# Patient Record
Sex: Male | Born: 1985
Health system: Southern US, Community
[De-identification: ages and names within clinical notes are randomized; demographics above are authoritative.]

## PROBLEM LIST (undated history)

## (undated) DIAGNOSIS — K219 Gastro-esophageal reflux disease without esophagitis: Secondary | ICD-10-CM

## (undated) DIAGNOSIS — J302 Other seasonal allergic rhinitis: Secondary | ICD-10-CM

## (undated) DIAGNOSIS — F988 Other specified behavioral and emotional disorders with onset usually occurring in childhood and adolescence: Secondary | ICD-10-CM

## (undated) DIAGNOSIS — Z800 Family history of malignant neoplasm of digestive organs: Secondary | ICD-10-CM

## (undated) DIAGNOSIS — Z87442 Personal history of urinary calculi: Secondary | ICD-10-CM

## (undated) DIAGNOSIS — Z85830 Personal history of malignant neoplasm of bone: Secondary | ICD-10-CM

## (undated) DIAGNOSIS — F419 Anxiety disorder, unspecified: Secondary | ICD-10-CM

## (undated) DIAGNOSIS — Z8042 Family history of malignant neoplasm of prostate: Secondary | ICD-10-CM

## (undated) DIAGNOSIS — I341 Nonrheumatic mitral (valve) prolapse: Secondary | ICD-10-CM

## (undated) DIAGNOSIS — I7300 Raynaud's syndrome without gangrene: Secondary | ICD-10-CM

## (undated) DIAGNOSIS — Z803 Family history of malignant neoplasm of breast: Secondary | ICD-10-CM

## (undated) DIAGNOSIS — Z9889 Other specified postprocedural states: Secondary | ICD-10-CM

## (undated) DIAGNOSIS — R011 Cardiac murmur, unspecified: Secondary | ICD-10-CM

## (undated) DIAGNOSIS — E785 Hyperlipidemia, unspecified: Secondary | ICD-10-CM

## (undated) HISTORY — DX: Family history of malignant neoplasm of prostate: Z80.42

## (undated) HISTORY — DX: Family history of malignant neoplasm of digestive organs: Z80.0

## (undated) HISTORY — PX: INNER EAR SURGERY: SHX679

## (undated) HISTORY — DX: Family history of malignant neoplasm of breast: Z80.3

## (undated) HISTORY — DX: Other seasonal allergic rhinitis: J30.2

---

## 2011-07-22 ENCOUNTER — Inpatient Hospital Stay (INDEPENDENT_AMBULATORY_CARE_PROVIDER_SITE_OTHER)
Admission: RE | Admit: 2011-07-22 | Discharge: 2011-07-22 | Disposition: A | Discharge status: Home / Self Care | Payer: 59 | Source: Ambulatory Visit | Attending: Family Medicine | Admitting: Family Medicine

## 2011-07-22 ENCOUNTER — Encounter: Payer: Self-pay | Admitting: Family Medicine

## 2011-07-22 DIAGNOSIS — J209 Acute bronchitis, unspecified: Secondary | ICD-10-CM

## 2011-07-22 DIAGNOSIS — R05 Cough: Secondary | ICD-10-CM | POA: Insufficient documentation

## 2011-07-22 DIAGNOSIS — R059 Cough, unspecified: Secondary | ICD-10-CM | POA: Insufficient documentation

## 2011-08-29 NOTE — Progress Notes (Signed)
Summary: cough rm 5   Vital Signs:  Patient Profile:   25 Years Old Male CC:      Cough x 2wks Height:     69 inches Weight:      208.50 pounds O2 Sat:      98 % O2 treatment:    Room Air Temp:     98.8 degrees F oral Pulse rate:   71 / minute Resp:     18 per minute BP sitting:   117 / 76  (left arm) Cuff size:   large  Vitals Entered By: Clemens Catholic LPN (July 22, 2011 10:08 AM)                  Updated Prior Medication List: No Medications Current Allergies: No known allergies History of Present Illness Chief Complaint: Cough x 2wks History of Present Illness:  Subjective: Patient complains of onset of non-productive cough about two weeks ago + scratchy throat started yesterday. No pleuritic pain No wheezing Minimal nasal congestion for about two days + post-nasal drainage No sinus pain/pressure No itchy/red eyes No earache No hemoptysis No SOB No fever/chills No nausea No vomiting No abdominal pain No diarrhea No skin rashes No fatigue No myalgias No headache Used OTC meds without relief (Robitussin)  REVIEW OF SYSTEMS Constitutional Symptoms      Denies fever, chills, night sweats, weight loss, weight gain, and fatigue.  Eyes       Complains of glasses and contact lenses.      Denies change in vision, eye pain, eye discharge, and eye surgery. Ear/Nose/Throat/Mouth       Denies hearing loss/aids, change in hearing, ear pain, ear discharge, dizziness, frequent runny nose, frequent nose bleeds, sinus problems, sore throat, hoarseness, and tooth pain or bleeding.  Respiratory       Complains of dry cough and productive cough.      Denies wheezing, shortness of breath, asthma, bronchitis, and emphysema/COPD.  Cardiovascular       Denies murmurs, chest pain, and tires easily with exhertion.    Gastrointestinal       Denies stomach pain, nausea/vomiting, diarrhea, constipation, blood in bowel movements, and indigestion. Genitourniary  Denies painful urination, kidney stones, and loss of urinary control. Neurological       Denies paralysis, seizures, and fainting/blackouts. Musculoskeletal       Denies muscle pain, joint pain, joint stiffness, decreased range of motion, redness, swelling, muscle weakness, and gout.  Skin       Denies bruising, unusual mles/lumps or sores, and hair/skin or nail changes.  Psych       Denies mood changes, temper/anger issues, anxiety/stress, speech problems, depression, and sleep problems. Other Comments: pt c/o cough x 2wks, mostly dry cough, productive at times. no fever. he has taken Robt with no relief.    Past History:  Past Medical History: Unremarkable  Past Surgical History: stapes replacement 2008  Family History: none  Social History: Never Smoked Alcohol use-no Drug use-no Smoking Status:  never Drug Use:  no   Objective:  Appearance:  Patient appears healthy, stated age, and in no acute distress  Eyes:  Pupils are equal, round, and reactive to light and accomodation.  Extraocular movement is intact.  Conjunctivae are not inflamed.  Ears:  Canals normal.  Tympanic membranes normal.   Nose:  Mildly congested turbinates.  No sinus tenderness  Pharynx:  Normal  Neck:  Supple.  No adenopathy is present.  Lungs:  Clear to auscultation.  Breath sounds are equal.  Chest:  No tenderness over sternum Heart:  Regular rate and rhythm without murmurs, rubs, or gallops.  Abdomen:  Nontender without masses or hepatosplenomegaly.  Bowel sounds are present.  No CVA or flank tenderness.  Extremities:  No edema.   Skin:  No rash Assessment New Problems: COUGH (ICD-786.2) BRONCHITIS, ACUTE (ICD-466.0)  PROBABLY A VIRAL URI, BUT COULD BE PERTUSSIS.  WILL TREAT AS A BRONCHITIS  Plan New Medications/Changes: BENZONATATE 200 MG CAPS (BENZONATATE) One by mouth hs as needed cough  #12 x 0, 07/22/2011, Donna Christen MD CLARITHROMYCIN 500 MG TABS (CLARITHROMYCIN) One Tab by mouth  two times a day  #20 x 0, 07/22/2011, Donna Christen MD  New Orders: Pulse Oximetry (single measurment) [phone] New Patient Level IV [99204] Planning Comments:   Begin Biaxin, expectorant, decongestant as needed,  cough suppressant at bedtime.  Increase fluid intake Recommend Tdap when well Followup with PCP if not improving 7 to 10 days   The patient and/or caregiver has been counseled thoroughly with regard to medications prescribed including dosage, schedule, interactions, rationale for use, and possible side effects and they verbalize understanding.  Diagnoses and expected course of recovery discussed and will return if not improved as expected or if the condition worsens. Patient and/or caregiver verbalized understanding.  Prescriptions: BENZONATATE 200 MG CAPS (BENZONATATE) One by mouth hs as needed cough  #12 x 0   Entered and Authorized by:   Donna Christen MD   Signed by:   Donna Christen MD on 07/22/2011   Method used:   Print then Give to Patient   RxID:   [id number] CLARITHROMYCIN 500 MG TABS (CLARITHROMYCIN) One Tab by mouth two times a day  #20 x 0   Entered and Authorized by:   Donna Christen MD   Signed by:   Donna Christen MD on 07/22/2011   Method used:   Print then Give to Patient   RxID:   [id number]   Patient Instructions: 1)  Take Mucinex (guaifenesin) twice daily for congestion. 2)  Increase fluid intake, rest. 3)  May add Sudafed if sinus congestion increases.  4)  Also for sinus congestion may use Afrin nasal spray (or generic oxymetazoline) twice daily for about 5 days.  Also recommend using saline nasal spray several times daily and/or saline nasal irrigation. 5)  Recommend Tdap when well. 6)  Followup with family doctor if not improving 7 to 10 days.   Orders Added: 1)  Pulse Oximetry (single measurment) [94760] 2)  New Patient Level IV [44010]

## 2012-08-07 ENCOUNTER — Encounter: Payer: Self-pay | Admitting: Sports Medicine

## 2012-08-07 ENCOUNTER — Ambulatory Visit (INDEPENDENT_AMBULATORY_CARE_PROVIDER_SITE_OTHER): Payer: 59 | Admitting: Sports Medicine

## 2012-08-07 VITALS — BP 121/76 | HR 74 | Wt 219.0 lb

## 2012-08-07 DIAGNOSIS — Z299 Encounter for prophylactic measures, unspecified: Secondary | ICD-10-CM

## 2012-08-07 DIAGNOSIS — Z0000 Encounter for general adult medical examination without abnormal findings: Secondary | ICD-10-CM | POA: Insufficient documentation

## 2012-08-07 NOTE — Assessment & Plan Note (Signed)
Flu shot last month, Tdap in 2006. CBC, CMET, TSH, lipids. Return to clinic in one year or sooner if needed.

## 2012-08-07 NOTE — Progress Notes (Signed)
Subjective:     Patient ID: [name] [name], male   DOB: 1986-09-07, 26 y.o.   MRN: [id number]  HPI Patient is a 26 yo man with no significant past medical history here to establish care and a routine physical.   Patient has no acute complaints  Review of Systems  Constitutional: Negative.   HENT: Negative.   Eyes: Negative.   Respiratory: Negative.   Cardiovascular: Negative.   Gastrointestinal: Negative.   Genitourinary: Negative.   Musculoskeletal: Negative.   Skin: Negative.   Neurological: Negative.   Hematological: Negative.   Psychiatric/Behavioral: Negative.        Objective:   Physical Exam  Constitutional: He is oriented to person, place, and time. He appears well-developed and well-nourished.  HENT:  Head: Normocephalic and atraumatic.  Right Ear: External ear normal.  Left Ear: External ear normal.  Nose: Nose normal.  Mouth/Throat: Oropharynx is clear and moist.  Eyes: Conjunctivae normal and EOM are normal. Pupils are equal, round, and reactive to light.  Neck: Normal range of motion. Neck supple.  Cardiovascular: Normal rate, regular rhythm, normal heart sounds and intact distal pulses.   Pulmonary/Chest: Effort normal and breath sounds normal.  Abdominal: Soft. Bowel sounds are normal.  Musculoskeletal: Normal range of motion. He exhibits no edema and no tenderness.  Neurological: He is alert and oriented to person, place, and time.  Skin: Skin is warm and dry.  Psychiatric: He has a normal mood and affect. His behavior is normal. Judgment and thought content normal.   The patient was counseled, respective discussed, and anticipatory guidance given     Assessment/plan:

## 2012-08-08 ENCOUNTER — Telehealth: Payer: Self-pay | Admitting: *Deleted

## 2012-08-08 LAB — TSH: TSH: 0.662 u[IU]/mL (ref 0.350–4.500)

## 2012-08-08 LAB — CBC
HCT: 48.4 % (ref 39.0–52.0)
Hemoglobin: 16.7 g/dL (ref 13.0–17.0)
MCH: 29.8 pg (ref 26.0–34.0)
MCHC: 34.5 g/dL (ref 30.0–36.0)
MCV: 86.4 fL (ref 78.0–100.0)
Platelets: 273 K/uL (ref 150–400)
RBC: 5.6 MIL/uL (ref 4.22–5.81)
RDW: 13.1 % (ref 11.5–15.5)
WBC: 6.5 K/uL (ref 4.0–10.5)

## 2012-08-08 LAB — COMPREHENSIVE METABOLIC PANEL WITH GFR
ALT: 31 U/L (ref 0–53)
AST: 18 U/L (ref 0–37)
CO2: 27 meq/L (ref 19–32)
Calcium: 9.8 mg/dL (ref 8.4–10.5)
Chloride: 105 meq/L (ref 96–112)
Creat: 0.89 mg/dL (ref 0.50–1.35)
Sodium: 143 meq/L (ref 135–145)
Total Protein: 6.9 g/dL (ref 6.0–8.3)

## 2012-08-08 LAB — LIPID PANEL
Cholesterol: 164 mg/dL (ref 0–200)
HDL: 36 mg/dL — ABNORMAL LOW (ref >39)
LDL Cholesterol: 98 mg/dL (ref 0–99)
Total CHOL/HDL Ratio: 4.6 ratio
Triglycerides: 152 mg/dL — ABNORMAL HIGH (ref <150)
VLDL: 30 mg/dL (ref 0–40)

## 2012-08-08 LAB — COMPREHENSIVE METABOLIC PANEL
Albumin: 5 g/dL (ref 3.5–5.2)
Alkaline Phosphatase: 100 U/L (ref 39–117)
BUN: 7 mg/dL (ref 6–23)
Glucose, Bld: 89 mg/dL (ref 70–99)
Potassium: 4.7 mEq/L (ref 3.5–5.3)
Total Bilirubin: 0.6 mg/dL (ref 0.3–1.2)

## 2012-08-08 NOTE — Telephone Encounter (Signed)
Left Message- Per patient note ok to leave detailed msg. Lab results given as normal with exception of HDL. Per Dr. Karie Schwalbe, increase aerobic exercise to increase this number. Please call back with any questions or concerns.

## 2012-08-30 ENCOUNTER — Encounter: Payer: Self-pay | Admitting: Sports Medicine

## 2012-08-30 ENCOUNTER — Ambulatory Visit (INDEPENDENT_AMBULATORY_CARE_PROVIDER_SITE_OTHER): Payer: 59 | Admitting: Sports Medicine

## 2012-08-30 VITALS — BP 128/77 | HR 78 | Wt 223.0 lb

## 2012-08-30 DIAGNOSIS — B079 Viral wart, unspecified: Secondary | ICD-10-CM

## 2012-08-30 DIAGNOSIS — B078 Other viral warts: Secondary | ICD-10-CM | POA: Insufficient documentation

## 2012-08-30 NOTE — Progress Notes (Signed)
Procedure: Cryotherapy of verruca. Identified a small verruca on the pad of the right second finger. The CryoGun was used to freeze the area multiple times. A sterile dressing was applied. Patient was discharged in stable condition.

## 2012-08-30 NOTE — Assessment & Plan Note (Signed)
Cryotherapy performed x1. He will come back Q. weekly as needed for repeat treatments.

## 2012-09-20 ENCOUNTER — Encounter: Payer: Self-pay | Admitting: Sports Medicine

## 2012-09-20 ENCOUNTER — Ambulatory Visit (INDEPENDENT_AMBULATORY_CARE_PROVIDER_SITE_OTHER): Payer: 59 | Admitting: Sports Medicine

## 2012-09-20 VITALS — BP 143/72 | HR 77 | Wt 224.0 lb

## 2012-09-20 DIAGNOSIS — S39012A Strain of muscle, fascia and tendon of lower back, initial encounter: Secondary | ICD-10-CM | POA: Insufficient documentation

## 2012-09-20 DIAGNOSIS — B078 Other viral warts: Secondary | ICD-10-CM

## 2012-09-20 DIAGNOSIS — S335XXA Sprain of ligaments of lumbar spine, initial encounter: Secondary | ICD-10-CM

## 2012-09-20 DIAGNOSIS — B079 Viral wart, unspecified: Secondary | ICD-10-CM

## 2012-09-20 MED ORDER — CYCLOBENZAPRINE HCL 10 MG PO TABS: ORAL_TABLET | ORAL | Status: DC

## 2012-09-20 MED ORDER — MELOXICAM 15 MG PO TABS: ORAL_TABLET | ORAL | Status: DC

## 2012-09-20 NOTE — Assessment & Plan Note (Signed)
Completely resolved status post single cryotherapy treatment.

## 2012-09-20 NOTE — Assessment & Plan Note (Signed)
Was essentially negative exam. No identifiable inciting factor. We'll treat conservatively with Mobic, Flexeril, and home rehabilitation exercises. If no better in 2 weeks, can come back and see me for consideration of imaging

## 2012-09-20 NOTE — Progress Notes (Signed)
SPORTS MEDICINE CONSULTATION REPORT  Subjective:    CC: Back pain  HPI: And is a very pleasant 26 year old male, he works for [employer]. For the past few days he's had pain that he localizes along left paralumbar muscles worse with reaching up. He denies any heavy lifting, or changes in his exercise regimen. The pain starts in the lower lumbar region, radiates up to his rhomboids. He denies any radicular symptoms, numbness, tingling, loss of bowel or bladder function, or constitutional symptoms. He tried 800 mg of ibuprofen once which was moderately effective. The pain is moderate.  Past medical history, Surgical history, Family history, Social history, Allergies, and medications have been entered into the medical record, reviewed, and no changes needed.   Review of Systems: No headache, visual changes, nausea, vomiting, diarrhea, constipation, dizziness, abdominal pain, skin rash, fevers, chills, night sweats, weight loss, swollen lymph nodes, body aches, joint swelling, muscle aches, chest pain, shortness of breath, mood changes, visual or auditory hallucinations.   Objective:   Vitals:  Afebrile, vital signs stable. General: Well Developed, well nourished, and in no acute distress.  Neuro/Psych: Alert and oriented x3, extra-ocular muscles intact, able to move all 4 extremities.  Skin: Warm and dry, no rashes noted.  Respiratory: Not using accessory muscles, speaking in full sentences, trachea midline.  Cardiovascular: Pulses palpable, no extremity edema. Abdomen: Does not appear distended. Back Exam:  Inspection: Unremarkable  Motion: Flexion 45 deg, Extension 45 deg, Side Bending to 45 deg bilaterally,  Rotation to 45 deg bilaterally  SLR laying: Negative  XSLR laying: Negative  Palpable tenderness: None. FABER: negative. Sensory change: Gross sensation intact to all lumbar and sacral dermatomes.  Reflexes: 2+ at both patellar tendons, 2+ at achilles tendons, Babinski's downgoing.    Strength at foot  Plantar-flexion: 5/5 Dorsi-flexion: 5/5 Eversion: 5/5 Inversion: 5/5  Leg strength  Quad: 5/5 Hamstring: 5/5 Hip flexor: 5/5 Hip abductors: 5/5  Gait unremarkable.   Impression and Recommendations:   This case required medical decision making of moderate complexity.

## 2012-11-10 ENCOUNTER — Other Ambulatory Visit: Payer: Self-pay

## 2013-02-15 ENCOUNTER — Encounter: Payer: Self-pay | Admitting: Sports Medicine

## 2013-02-15 ENCOUNTER — Ambulatory Visit (INDEPENDENT_AMBULATORY_CARE_PROVIDER_SITE_OTHER): Payer: 59 | Admitting: Sports Medicine

## 2013-02-15 VITALS — BP 137/83 | HR 101 | Wt 221.0 lb

## 2013-02-15 DIAGNOSIS — L02219 Cutaneous abscess of trunk, unspecified: Secondary | ICD-10-CM

## 2013-02-15 DIAGNOSIS — L03319 Cellulitis of trunk, unspecified: Secondary | ICD-10-CM

## 2013-02-15 MED ORDER — ACETAMINOPHEN-CODEINE 300-30 MG PO TABS: 1.0000 | ORAL_TABLET | Freq: Four times a day (QID) | ORAL | Status: DC | PRN

## 2013-02-15 MED ORDER — DOXYCYCLINE HYCLATE 100 MG PO TABS: 100.0000 mg | ORAL_TABLET | Freq: Two times a day (BID) | ORAL | Status: DC

## 2013-02-15 NOTE — Progress Notes (Signed)
  Subjective:    CC: Lump on back  HPI: This pleasant 27 year old male comes in with a painful lump on his back in the midline at the present for several days now. It is draining slightly. Pain is localized, doesn't radiate, moderate. No fevers, chills.  Past medical history, Surgical history, Family history not pertinant except as noted below, Social history, Allergies, and medications have been entered into the medical record, reviewed, and no changes needed.   Review of Systems: No fevers, chills, night sweats, weight loss, chest pain, or shortness of breath.   Objective:    General: Well Developed, well nourished, and in no acute distress.  Neuro: Alert and oriented x3, extra-ocular muscles intact, sensation grossly intact.  HEENT: Normocephalic, atraumatic, pupils equal round reactive to light, neck supple, no masses, no lymphadenopathy, thyroid nonpalpable.  Skin: Warm and dry, no rashes. There is a 2 cm complicated abscess in the midline of his mid back Cardiac: Regular rate and rhythm, no murmurs rubs or gallops, no lower extremity edema.  Respiratory: Clear to auscultation bilaterally. Not using accessory muscles, speaking in full sentences  Procedure:  Complicated incision and drainage of abscess.  Risks, benefits, and alternatives explained and consent obtained. Time out conducted. Surface cleaned with alcohol. 10cc lidocaine with epinephine infiltrated around abscess. Adequate anesthesia ensured. Area prepped and draped in a sterile fashion. #11 blade used to make a stab incision into abscess. Pus expressed with pressure. Curved hemostat used to explore 4 quadrants and loculations broken up. Further purulence expressed. Iodoform packing placed leaving a 1-inch tail. Hemostasis achieved. Pt stable. Aftercare and follow-up advised.   Impression and Recommendations:

## 2013-02-15 NOTE — Patient Instructions (Addendum)
Incision and Drainage   Care After   Refer to this sheet in the next few weeks. These instructions provide you with information on caring for yourself after your procedure. Your caregiver may also give you more specific instructions. Your treatment has been planned according to current medical practices, but problems sometimes occur. Call your caregiver if you have any problems or questions after your procedure.   HOME CARE INSTRUCTIONS   If antibiotic medicine is given, take it as directed. Finish it even if you start to feel better.   Only take over-the-counter or prescription medicines for pain, discomfort, or fever as directed by your caregiver.   Keep all follow-up appointments as directed by your caregiver.   Change any bandages (dressings) as directed by your caregiver. Replace old dressings with clean dressings.   Wash your hands before and after caring for your wound.  You will receive specific instructions for cleansing and caring for your wound.   SEEK MEDICAL CARE IF:   You have increased pain, swelling, or redness around the wound.   You have increased drainage, smell, or bleeding from the wound.   You have muscle aches, chills, or you feel generally sick.   You have a fever.  MAKE SURE YOU:   Understand these instructions.   Will watch your condition.   Will get help right away if you are not doing well or get worse.  Document Released: 12/05/2011 Document Reviewed: 12/05/2011   Exit[employer]® Patient Information ©2014 [employer].

## 2013-02-15 NOTE — Assessment & Plan Note (Signed)
Incision and drainage as above. Doxycycline. Tylenol with Codeine for pain. Removed packing in 2 days. Return to see me to recheck wound sometime middle of next week.

## 2013-02-20 ENCOUNTER — Ambulatory Visit: Payer: 59 | Admitting: Sports Medicine

## 2013-02-22 ENCOUNTER — Ambulatory Visit: Payer: 59 | Admitting: Sports Medicine

## 2013-02-22 ENCOUNTER — Encounter: Payer: Self-pay | Admitting: Sports Medicine

## 2013-02-22 VITALS — BP 123/73 | HR 84 | Wt 221.0 lb

## 2013-02-22 DIAGNOSIS — L03319 Cellulitis of trunk, unspecified: Secondary | ICD-10-CM

## 2013-02-22 DIAGNOSIS — L02219 Cutaneous abscess of trunk, unspecified: Secondary | ICD-10-CM

## 2013-02-22 NOTE — Assessment & Plan Note (Signed)
Healing. Return as needed.

## 2013-02-22 NOTE — Progress Notes (Signed)
  Subjective: 7 days status post incision and drainage of abscess. Doing well, packing is gone, no constitutional symptoms.   Objective: General: Well-developed, well-nourished, and in no acute distress. Dressing is clean, dry, and intact, no erythema, no drainage.  Assessment/plan:

## 2013-04-30 ENCOUNTER — Encounter: Payer: Self-pay | Admitting: Sports Medicine

## 2013-04-30 ENCOUNTER — Ambulatory Visit (INDEPENDENT_AMBULATORY_CARE_PROVIDER_SITE_OTHER): Payer: 59 | Admitting: Sports Medicine

## 2013-04-30 VITALS — BP 124/77 | HR 69 | Wt 217.0 lb

## 2013-04-30 DIAGNOSIS — F909 Attention-deficit hyperactivity disorder, unspecified type: Secondary | ICD-10-CM | POA: Insufficient documentation

## 2013-04-30 DIAGNOSIS — R41840 Attention and concentration deficit: Secondary | ICD-10-CM

## 2013-04-30 NOTE — Assessment & Plan Note (Signed)
This sounds like ADHD, inattentive subtype. I would like to refer him to Dr. Dellia Cloud for consideration of neuropsychiatric testing. If this does look like ADHD I'm happy to start Concerta.

## 2013-04-30 NOTE — Progress Notes (Signed)
  Subjective:    CC: ADHD  HPI: This is a very pleasant 27 year old male, or several years but more recently lately he is noted difficulty concentrating, staying on task, but no hyperactive symptoms. His symptoms persist in multiple settings including school, home, when going to the movies, and at work. Is at the point where it is affecting his daily life. Symptoms are moderate, stable.  Past medical history, Surgical history, Family history not pertinant except as noted below, Social history, Allergies, and medications have been entered into the medical record, reviewed, and no changes needed.   Review of Systems: No fevers, chills, night sweats, weight loss, chest pain, or shortness of breath.   Objective:    General: Well Developed, well nourished, and in no acute distress.  Neuro: Alert and oriented x3, extra-ocular muscles intact, sensation grossly intact.  HEENT: Normocephalic, atraumatic, pupils equal round reactive to light, neck supple, no masses, no lymphadenopathy, thyroid nonpalpable.  Skin: Warm and dry, no rashes. Cardiac: Regular rate and rhythm, no murmurs rubs or gallops, no lower extremity edema.  Respiratory: Clear to auscultation bilaterally. Not using accessory muscles, speaking in full sentences.  Impression and Recommendations:

## 2013-04-30 NOTE — Patient Instructions (Addendum)
Attention Deficit Hyperactivity Disorder Attention deficit hyperactivity disorder (ADHD) is a problem with behavior issues based on the way the brain functions (neurobehavioral disorder). It is a common reason for behavior and academic problems in school. CAUSES  The cause of ADHD is unknown in most cases. It may run in families. It sometimes can be associated with learning disabilities and other behavioral problems. SYMPTOMS  There are 3 types of ADHD. The 3 types and some of the symptoms include:  Inattentive  Gets bored or distracted easily.  Loses or forgets things. Forgets to hand in homework.  Has trouble organizing or completing tasks.  Difficulty staying on task.  An inability to organize daily tasks and school work.  Leaving projects, chores, or homework unfinished.  Trouble paying attention or responding to details. Careless mistakes.  Difficulty following directions. Often seems like is not listening.  Dislikes activities that require sustained attention (like chores or homework).  Hyperactive-impulsive  Feels like it is impossible to sit still or stay in a seat. Fidgeting with hands and feet.  Trouble waiting turn.  Talking too much or out of turn. Interruptive.  Speaks or acts impulsively.  Aggressive, disruptive behavior.  Constantly busy or on the go, noisy.  Combined  Has symptoms of both of the above. Often children with ADHD feel discouraged about themselves and with school. They often perform well below their abilities in school. These symptoms can cause problems in home, school, and in relationships with peers. As children get older, the excess motor activities can calm down, but the problems with paying attention and staying organized persist. Most children do not outgrow ADHD but with good treatment can learn to cope with the symptoms. DIAGNOSIS  When ADHD is suspected, the diagnosis should be made by professionals trained in ADHD.  Diagnosis will  include:  Ruling out other reasons for the child's behavior.  The caregivers will check with the child's school and check their medical records.  They will talk to teachers and parents.  Behavior rating scales for the child will be filled out by those dealing with the child on a daily basis. A diagnosis is made only after all information has been considered. TREATMENT  Treatment usually includes behavioral treatment often along with medicines. It may include stimulant medicines. The stimulant medicines decrease impulsivity and hyperactivity and increase attention. Other medicines used include antidepressants and certain blood pressure medicines. Most experts agree that treatment for ADHD should address all aspects of the child's functioning. Treatment should not be limited to the use of medicines alone. Treatment should include structured classroom management. The parents must receive education to address rewarding good behavior, discipline, and limit-setting. Tutoring or behavioral therapy or both should be available for the child. If untreated, the disorder can have long-term serious effects into adolescence and adulthood. HOME CARE INSTRUCTIONS   Often with ADHD there is a lot of frustration among the family in dealing with the illness. There is often blame and anger that is not warranted. This is a life long illness. There is no way to prevent ADHD. In many cases, because the problem affects the family as a whole, the entire family may need help. A therapist can help the family find better ways to handle the disruptive behaviors and promote change. If the child is young, most of the therapist's work is with the parents. Parents will learn techniques for coping with and improving their child's behavior. Sometimes only the child with the ADHD needs counseling. Your caregivers can help   you make these decisions.  Children with ADHD may need help in organizing. Some helpful tips include:  Keep  routines the same every day from wake-up time to bedtime. Schedule everything. This includes homework and playtime. This should include outdoor and indoor recreation. Keep the schedule on the refrigerator or a bulletin board where it is frequently seen. Mark schedule changes as far in advance as possible.  Have a place for everything and keep everything in its place. This includes clothing, backpacks, and school supplies.  Encourage writing down assignments and bringing home needed books.  Offer your child a well-balanced diet. Breakfast is especially important for school performance. Children should avoid drinks with caffeine including:  Soft drinks.  Coffee.  Tea.  However, some older children (adolescents) may find these drinks helpful in improving their attention.  Children with ADHD need consistent rules that they can understand and follow. If rules are followed, give small rewards. Children with ADHD often receive, and expect, criticism. Look for good behavior and praise it. Set realistic goals. Give clear instructions. Look for activities that can foster success and self-esteem. Make time for pleasant activities with your child. Give lots of affection.  Parents are their children's greatest advocates. Learn as much as possible about ADHD. This helps you become a stronger and better advocate for your child. It also helps you educate your child's teachers and instructors if they feel inadequate in these areas. Parent support groups are often helpful. A national group with local chapters is called CHADD (Children and Adults with Attention Deficit Hyperactivity Disorder). PROGNOSIS  There is no cure for ADHD. Children with the disorder seldom outgrow it. Many find adaptive ways to accommodate the ADHD as they mature. SEEK MEDICAL CARE IF:  Your child has repeated muscle twitches, cough or speech outbursts.  Your child has sleep problems.  Your child has a marked loss of  appetite.  Your child develops depression.  Your child has new or worsening behavioral problems.  Your child develops dizziness.  Your child has a racing heart.  Your child has stomach pains.  Your child develops headaches. Document Released: 09/02/2002 Document Revised: 12/05/2011 Document Reviewed: 04/14/2008 Exit[employer] Patient Information 2014 [employer].  

## 2013-05-14 ENCOUNTER — Ambulatory Visit (INDEPENDENT_AMBULATORY_CARE_PROVIDER_SITE_OTHER): Payer: 59 | Admitting: Psychology

## 2013-05-14 DIAGNOSIS — F988 Other specified behavioral and emotional disorders with onset usually occurring in childhood and adolescence: Secondary | ICD-10-CM

## 2013-05-17 ENCOUNTER — Other Ambulatory Visit (INDEPENDENT_AMBULATORY_CARE_PROVIDER_SITE_OTHER): Payer: 59 | Admitting: Psychology

## 2013-05-17 DIAGNOSIS — F988 Other specified behavioral and emotional disorders with onset usually occurring in childhood and adolescence: Secondary | ICD-10-CM

## 2013-05-22 ENCOUNTER — Encounter: Payer: Self-pay | Admitting: Sports Medicine

## 2013-05-22 ENCOUNTER — Ambulatory Visit (INDEPENDENT_AMBULATORY_CARE_PROVIDER_SITE_OTHER): Payer: 59 | Admitting: Sports Medicine

## 2013-05-22 VITALS — BP 136/78 | HR 77 | Wt 222.0 lb

## 2013-05-22 DIAGNOSIS — F909 Attention-deficit hyperactivity disorder, unspecified type: Secondary | ICD-10-CM

## 2013-05-22 MED ORDER — METHYLPHENIDATE HCL ER (OSM) 27 MG PO TBCR: 27.0000 mg | EXTENDED_RELEASE_TABLET | ORAL | Status: DC

## 2013-05-22 NOTE — Progress Notes (Signed)
  Subjective:    CC: Follow up  HPI: Difficulty concentrating: I saw injury at the last visit, he saw Dr. Dellia Cloud, I do not have an official report but he tells me that he she was diagnosed based on neuropsychiatric testing, and oral stimulant medication was recommended.  Past medical history, Surgical history, Family history not pertinant except as noted below, Social history, Allergies, and medications have been entered into the medical record, reviewed, and no changes needed.   Review of Systems: No fevers, chills, night sweats, weight loss, chest pain, or shortness of breath.   Objective:    General: Well Developed, well nourished, and in no acute distress.  Neuro: Alert and oriented x3, extra-ocular muscles intact, sensation grossly intact.  HEENT: Normocephalic, atraumatic, pupils equal round reactive to light, neck supple, no masses, no lymphadenopathy, thyroid nonpalpable.  Skin: Warm and dry, no rashes. Cardiac: Regular rate and rhythm, no murmurs rubs or gallops, no lower extremity edema.  Respiratory: Clear to auscultation bilaterally. Not using accessory muscles, speaking in full sentences. Impression and Recommendations:

## 2013-05-22 NOTE — Assessment & Plan Note (Signed)
Saw Dr. Dellia Cloud, confirmed diagnosis of ADHD. I do not have report yet. Starting Concerta 27 mg. Return in one month for refills.

## 2013-06-19 ENCOUNTER — Ambulatory Visit (INDEPENDENT_AMBULATORY_CARE_PROVIDER_SITE_OTHER): Payer: 59 | Admitting: Sports Medicine

## 2013-06-19 ENCOUNTER — Encounter: Payer: Self-pay | Admitting: Sports Medicine

## 2013-06-19 VITALS — BP 129/76 | HR 71 | Wt 225.0 lb

## 2013-06-19 DIAGNOSIS — F909 Attention-deficit hyperactivity disorder, unspecified type: Secondary | ICD-10-CM

## 2013-06-19 DIAGNOSIS — L708 Other acne: Secondary | ICD-10-CM

## 2013-06-19 DIAGNOSIS — L700 Acne vulgaris: Secondary | ICD-10-CM | POA: Insufficient documentation

## 2013-06-19 MED ORDER — TRETINOIN 0.1 % EX CREA: TOPICAL_CREAM | Freq: Every day | CUTANEOUS | Status: DC

## 2013-06-19 MED ORDER — METHYLPHENIDATE HCL ER (OSM) 36 MG PO TBCR: 36.0000 mg | EXTENDED_RELEASE_TABLET | ORAL | Status: DC

## 2013-06-19 NOTE — Assessment & Plan Note (Signed)
Minimal response to Concerta 27 mg. He does get some weaning of the fact at the end of day. We will uptitrate his daily dose until daily affect is adequate, at which point we can add a p.m. dose of short-acting Ritalin. Increasing to Concerta 36 mg, return in one month.

## 2013-06-19 NOTE — Assessment & Plan Note (Signed)
Improved significantly while he was on doxycycline for an infection. His acne is localized on his face would rather try tretinoin for 3 months. If no improvement we can switch back to daily doxycycline.

## 2013-06-19 NOTE — Progress Notes (Signed)
  Subjective:    CC: Followup  HPI: Acne: Noted a great improvement in the acne which is present only on his face and use of doxycycline. Wonders if we can continue this. He has had not had much success with benzyl peroxide containing treatments in the past.  ADHD:  Confirmed by Dr. Dellia Cloud, start Concerta 27 mg, notes a small improvement, but insufficient during the day and some waning at the end of the day.  Past medical history, Surgical history, Family history not pertinant except as noted below, Social history, Allergies, and medications have been entered into the medical record, reviewed, and no changes needed.   Review of Systems: No fevers, chills, night sweats, weight loss, chest pain, or shortness of breath.   Objective:    General: Well Developed, well nourished, and in no acute distress.  Neuro: Alert and oriented x3, extra-ocular muscles intact, sensation grossly intact.  HEENT: Normocephalic, atraumatic, pupils equal round reactive to light, neck supple, no masses, no lymphadenopathy, thyroid nonpalpable.  Skin: Warm and dry, no rashes. Very mild acne vulgaris on the face, noncystic. Cardiac: Regular rate and rhythm, no murmurs rubs or gallops, no lower extremity edema.  Respiratory: Clear to auscultation bilaterally. Not using accessory muscles, speaking in full sentences.  Impression and Recommendations:

## 2013-06-24 ENCOUNTER — Telehealth: Payer: Self-pay | Admitting: *Deleted

## 2013-06-24 NOTE — Telephone Encounter (Signed)
PA approved for retin-A cream through [employer] from 06/23/13-06/23/14.

## 2013-07-17 ENCOUNTER — Encounter: Payer: Self-pay | Admitting: Sports Medicine

## 2013-07-17 ENCOUNTER — Ambulatory Visit (INDEPENDENT_AMBULATORY_CARE_PROVIDER_SITE_OTHER): Payer: 59 | Admitting: Sports Medicine

## 2013-07-17 VITALS — BP 132/71 | HR 74 | Wt 225.0 lb

## 2013-07-17 DIAGNOSIS — F909 Attention-deficit hyperactivity disorder, unspecified type: Secondary | ICD-10-CM

## 2013-07-17 DIAGNOSIS — L708 Other acne: Secondary | ICD-10-CM

## 2013-07-17 DIAGNOSIS — L700 Acne vulgaris: Secondary | ICD-10-CM

## 2013-07-17 MED ORDER — METHYLPHENIDATE HCL ER (OSM) 54 MG PO TBCR: 54.0000 mg | EXTENDED_RELEASE_TABLET | ORAL | Status: DC

## 2013-07-17 NOTE — Assessment & Plan Note (Signed)
While the skin irritation with tretinoin. This is normal, continue medication.

## 2013-07-17 NOTE — Assessment & Plan Note (Signed)
Actually has noted less of a response with 36 mg of Concerta versus 27. At this point we are going to increase to 54 mg for one month, and reevaluate.

## 2013-07-17 NOTE — Progress Notes (Signed)
  Subjective:    CC: Followup  HPI: ADD: Notes even less of an improvement when switching from 27-36 mg, he does desire to go up on the dose. No chest pain, palpitations, weight loss. No new issues with sleep.  Past medical history, Surgical history, Family history not pertinant except as noted below, Social history, Allergies, and medications have been entered into the medical record, reviewed, and no changes needed.   Review of Systems: No fevers, chills, night sweats, weight loss, chest pain, or shortness of breath.   Objective:    General: Well Developed, well nourished, and in no acute distress.  Neuro: Alert and oriented x3, extra-ocular muscles intact, sensation grossly intact.  HEENT: Normocephalic, atraumatic, pupils equal round reactive to light, neck supple, no masses, no lymphadenopathy, thyroid nonpalpable.  Skin: Warm and dry, no rashes. Cardiac: Regular rate and rhythm, no murmurs rubs or gallops, no lower extremity edema.  Respiratory: Clear to auscultation bilaterally. Not using accessory muscles, speaking in full sentences. Impression and Recommendations:

## 2013-08-01 ENCOUNTER — Other Ambulatory Visit: Payer: Self-pay

## 2013-08-19 ENCOUNTER — Ambulatory Visit: Payer: 59 | Admitting: Sports Medicine

## 2013-08-19 ENCOUNTER — Ambulatory Visit (INDEPENDENT_AMBULATORY_CARE_PROVIDER_SITE_OTHER): Payer: 59 | Admitting: Sports Medicine

## 2013-08-19 ENCOUNTER — Encounter: Payer: Self-pay | Admitting: Sports Medicine

## 2013-08-19 VITALS — BP 129/77 | HR 75 | Wt 225.0 lb

## 2013-08-19 DIAGNOSIS — Z299 Encounter for prophylactic measures, unspecified: Secondary | ICD-10-CM

## 2013-08-19 DIAGNOSIS — F909 Attention-deficit hyperactivity disorder, unspecified type: Secondary | ICD-10-CM

## 2013-08-19 DIAGNOSIS — Z0000 Encounter for general adult medical examination without abnormal findings: Secondary | ICD-10-CM

## 2013-08-19 MED ORDER — METHYLPHENIDATE HCL 10 MG PO TABS: 10.0000 mg | ORAL_TABLET | Freq: Every day | ORAL | Status: DC

## 2013-08-19 MED ORDER — METHYLPHENIDATE HCL ER (OSM) 54 MG PO TBCR: 54.0000 mg | EXTENDED_RELEASE_TABLET | ORAL | Status: DC

## 2013-08-19 NOTE — Assessment & Plan Note (Signed)
Continue Concerta 54 mg, good efficacy during the day. He does taper off at approximately 1 PM. I'm going to add a short-acting Ritalin for use during this tapering off phase. Return in one month to recheck how things are going.

## 2013-08-19 NOTE — Assessment & Plan Note (Signed)
Complete physical today. Return in one year.  Up-to-date on all vaccinations.

## 2013-08-19 NOTE — Progress Notes (Signed)
  Subjective:    CC: CPE  HPI:  Here for complete physical, up-to-date on all vaccinations.  ADD: Doing well with Concerta 54 mg, unfortunately is noting some tapering of effect around 1:00pm.  Past medical history, Surgical history, Family history not pertinant except as noted below, Social history, Allergies, and medications have been entered into the medical record, reviewed, and no changes needed.   Review of Systems: No headache, visual changes, nausea, vomiting, diarrhea, constipation, dizziness, abdominal pain, skin rash, fevers, chills, night sweats, swollen lymph nodes, weight loss, chest pain, body aches, joint swelling, muscle aches, shortness of breath, mood changes, visual or auditory hallucinations.  Objective:    General: Well Developed, well nourished, and in no acute distress.  Neuro: Alert and oriented x3, extra-ocular muscles intact, sensation grossly intact.  HEENT: Normocephalic, atraumatic, pupils equal round reactive to light, neck supple, no masses, no lymphadenopathy, thyroid nonpalpable. Oropharynx, nasopharynx, external ear canals are unremarkable. Skin: Warm and dry, no rashes noted.  Cardiac: Regular rate and rhythm, no murmurs rubs or gallops.  Respiratory: Clear to auscultation bilaterally. Not using accessory muscles, speaking in full sentences.  Abdominal: Soft, nontender, nondistended, positive bowel sounds, no masses, no organomegaly.  Musculoskeletal: Shoulder, elbow, wrist, hip, knee, ankle stable, and with full range of motion.  Impression and Recommendations:    The patient was counselled, risk factors were discussed, anticipatory guidance given.

## 2013-08-20 LAB — CBC
HCT: 47.2 % (ref 39.0–52.0)
Hemoglobin: 16.6 g/dL (ref 13.0–17.0)
MCH: 30.6 pg (ref 26.0–34.0)
MCHC: 35.2 g/dL (ref 30.0–36.0)
MCV: 86.9 fL (ref 78.0–100.0)
Platelets: 289 10*3/uL (ref 150–400)
RBC: 5.43 MIL/uL (ref 4.22–5.81)
RDW: 13.5 % (ref 11.5–15.5)
WBC: 7.4 10*3/uL (ref 4.0–10.5)

## 2013-08-20 LAB — LIPID PANEL
Cholesterol: 171 mg/dL (ref 0–200)
HDL: 41 mg/dL (ref >39)
LDL Cholesterol: 99 mg/dL (ref 0–99)
Total CHOL/HDL Ratio: 4.2 ratio
Triglycerides: 155 mg/dL — ABNORMAL HIGH (ref <150)
VLDL: 31 mg/dL (ref 0–40)

## 2013-08-20 LAB — TSH: TSH: 0.487 u[IU]/mL (ref 0.350–4.500)

## 2013-08-20 LAB — COMPREHENSIVE METABOLIC PANEL WITH GFR
AST: 19 U/L (ref 0–37)
Albumin: 4.8 g/dL (ref 3.5–5.2)
Alkaline Phosphatase: 92 U/L (ref 39–117)
BUN: 11 mg/dL (ref 6–23)
Potassium: 4.4 meq/L (ref 3.5–5.3)
Sodium: 141 meq/L (ref 135–145)

## 2013-08-20 LAB — COMPREHENSIVE METABOLIC PANEL
ALT: 34 U/L (ref 0–53)
CO2: 28 mEq/L (ref 19–32)
Calcium: 9.5 mg/dL (ref 8.4–10.5)
Chloride: 106 mEq/L (ref 96–112)
Creat: 1.1 mg/dL (ref 0.50–1.35)
Glucose, Bld: 96 mg/dL (ref 70–99)
Total Bilirubin: 0.6 mg/dL (ref 0.3–1.2)
Total Protein: 6.9 g/dL (ref 6.0–8.3)

## 2013-09-02 ENCOUNTER — Ambulatory Visit (INDEPENDENT_AMBULATORY_CARE_PROVIDER_SITE_OTHER): Payer: 59 | Admitting: Sports Medicine

## 2013-09-02 ENCOUNTER — Encounter: Payer: Self-pay | Admitting: Sports Medicine

## 2013-09-02 VITALS — BP 131/80 | HR 90 | Wt 231.0 lb

## 2013-09-02 DIAGNOSIS — J0110 Acute frontal sinusitis, unspecified: Secondary | ICD-10-CM

## 2013-09-02 MED ORDER — FLUTICASONE PROPIONATE 50 MCG/ACT NA SUSP: NASAL | Status: DC

## 2013-09-02 MED ORDER — AMOXICILLIN-POT CLAVULANATE 875-125 MG PO TABS: 1.0000 | ORAL_TABLET | Freq: Two times a day (BID) | ORAL | Status: DC

## 2013-09-02 NOTE — Assessment & Plan Note (Signed)
Augmentin, Flonase. Strep test is negative. Patient does have some Mobic left over for pain. Next line return if no better in 2 weeks.

## 2013-09-02 NOTE — Progress Notes (Signed)
Patient ID: [name] [name], male   DOB: 09/21/1986, 27 y.o.   MRN: [id number]  Subjective:    CC: Sore Throat and congestion  HPI: This is a pleasant 27 year old male who is here with a 4 day history of sore throat, mild cough and congestion. She reports that his symptoms began with a frontal headache on Friday and progressed to sore throat and congestion. Denies any fever, chills, nausea or diarrhea. He denies any recent sick contacts. He states that his symptoms haven't gotten better so he wanted to come in and check it out. Symptoms are moderate, persistent.  Past medical history, Surgical history, Family history not pertinant except as noted below, Social history, Allergies, and medications have been entered into the medical record, reviewed, and no changes needed.   Review of Systems: No fevers, chills, night sweats, weight loss, chest pain, or shortness of breath.   Objective:    General: Well Developed, well nourished, and in no acute distress.  Neuro: Alert and oriented x3, extra-ocular muscles intact, sensation grossly intact.  HEENT: Swollen tonsils without exudate. Mild nasal discharge seen in turbinates. Normocephalic, atraumatic, pupils equal round reactive to light, neck supple, no masses, no lymphadenopathy, thyroid nonpalpable.  Skin: Warm and dry, no rashes. Cardiac: Regular rate and rhythm, no murmurs rubs or gallops, no lower extremity edema.  Respiratory: Clear to auscultation bilaterally. Not using accessory muscles, speaking in full sentences.  Rapid strep test is negative.  Impression and Recommendations:   Assessment: This is a pleasant 27 year old male who is here with a 4 day history of sore throat, mild cough and congestion  Plan:  This is likely an acute sinusitis given his symptoms.  1) Meloxicam for frontal sinus pain 2) Flonase 3) Augment 875 mg  PO BID  For 10 days

## 2013-09-17 ENCOUNTER — Encounter: Payer: Self-pay | Admitting: Sports Medicine

## 2013-09-17 ENCOUNTER — Ambulatory Visit (INDEPENDENT_AMBULATORY_CARE_PROVIDER_SITE_OTHER): Payer: 59 | Admitting: Sports Medicine

## 2013-09-17 VITALS — BP 127/72 | HR 70 | Wt 226.0 lb

## 2013-09-17 DIAGNOSIS — F909 Attention-deficit hyperactivity disorder, unspecified type: Secondary | ICD-10-CM

## 2013-09-17 MED ORDER — METHYLPHENIDATE HCL ER (OSM) 18 MG PO TBCR: 18.0000 mg | EXTENDED_RELEASE_TABLET | Freq: Every day | ORAL | Status: DC

## 2013-09-17 MED ORDER — METHYLPHENIDATE HCL ER (OSM) 54 MG PO TBCR: 54.0000 mg | EXTENDED_RELEASE_TABLET | ORAL | Status: DC

## 2013-09-17 NOTE — Progress Notes (Signed)
  Subjective:    CC: Followup  HPI: Adult ADHD:  Doing well with morning dose of Concerta, was having tapering of effect in the afternoon, we added short acting ritalin but this unfortunately caused some headache.  Past medical history, Surgical history, Family history not pertinant except as noted below, Social history, Allergies, and medications have been entered into the medical record, reviewed, and no changes needed.   Review of Systems: No fevers, chills, night sweats, weight loss, chest pain, or shortness of breath.   Objective:    General: Well Developed, well nourished, and in no acute distress.  Neuro: Alert and oriented x3, extra-ocular muscles intact, sensation grossly intact.  HEENT: Normocephalic, atraumatic, pupils equal round reactive to light, neck supple, no masses, no lymphadenopathy, thyroid nonpalpable.  Skin: Warm and dry, no rashes. Cardiac: Regular rate and rhythm, no murmurs rubs or gallops, no lower extremity edema.  Respiratory: Clear to auscultation bilaterally. Not using accessory muscles, speaking in full sentences.  Impression and Recommendations:

## 2013-09-17 NOTE — Assessment & Plan Note (Signed)
Doing well in the morning on Concerta 54 mg continuous release. Unfortunately rapid release methylphenidate 10 mg gave him a headache. At this point I am going to add an 18 mg Concerta for use in the early afternoon in the hopes of mitigating the tapering effect of his morning Concerta.

## 2013-12-16 ENCOUNTER — Ambulatory Visit (INDEPENDENT_AMBULATORY_CARE_PROVIDER_SITE_OTHER): Payer: 59 | Admitting: Sports Medicine

## 2013-12-16 ENCOUNTER — Encounter: Payer: Self-pay | Admitting: Sports Medicine

## 2013-12-16 VITALS — BP 125/77 | HR 74 | Ht 69.0 in | Wt 231.0 lb

## 2013-12-16 DIAGNOSIS — F909 Attention-deficit hyperactivity disorder, unspecified type: Secondary | ICD-10-CM

## 2013-12-16 MED ORDER — METHYLPHENIDATE HCL ER (OSM) 18 MG PO TBCR: 18.0000 mg | EXTENDED_RELEASE_TABLET | Freq: Every day | ORAL | Status: DC

## 2013-12-16 MED ORDER — METHYLPHENIDATE HCL ER (OSM) 54 MG PO TBCR: 54.0000 mg | EXTENDED_RELEASE_TABLET | ORAL | Status: DC

## 2013-12-16 NOTE — Assessment & Plan Note (Signed)
I think that we have finally got a good regimen for [name] [name], 72 total milligrams of Concerta or day, 54 mg in the morning, and 18 mg in the afternoon to mitigate the tapering of effect of the morning dose. 3 months prescribed today, I am okay with him just calling for refills for the next 6 months to a year.

## 2013-12-16 NOTE — Progress Notes (Signed)
  Subjective:    CC: Followup  HPI: ADHD: Finally have found a good regimen with 54 mg of Concerta in the morning, and 18 mg in the afternoon. He is happy with results, having no headaches, and no difficulty sleeping.  Past medical history, Surgical history, Family history not pertinant except as noted below, Social history, Allergies, and medications have been entered into the medical record, reviewed, and no changes needed.   Review of Systems: No fevers, chills, night sweats, weight loss, chest pain, or shortness of breath.   Objective:    General: Well Developed, well nourished, and in no acute distress.  Neuro: Alert and oriented x3, extra-ocular muscles intact, sensation grossly intact.  HEENT: Normocephalic, atraumatic, pupils equal round reactive to light, neck supple, no masses, no lymphadenopathy, thyroid nonpalpable.  Skin: Warm and dry, no rashes. Cardiac: Regular rate and rhythm, no murmurs rubs or gallops, no lower extremity edema.  Respiratory: Clear to auscultation bilaterally. Not using accessory muscles, speaking in full sentences.  Impression and Recommendations:

## 2014-03-18 ENCOUNTER — Other Ambulatory Visit: Payer: Self-pay | Admitting: Sports Medicine

## 2014-03-18 DIAGNOSIS — F909 Attention-deficit hyperactivity disorder, unspecified type: Secondary | ICD-10-CM

## 2014-03-18 MED ORDER — METHYLPHENIDATE HCL ER (OSM) 54 MG PO TBCR: 54.0000 mg | EXTENDED_RELEASE_TABLET | ORAL | Status: DC

## 2014-03-18 MED ORDER — METHYLPHENIDATE HCL ER (OSM) 18 MG PO TBCR: 18.0000 mg | EXTENDED_RELEASE_TABLET | Freq: Every day | ORAL | Status: DC

## 2014-06-05 ENCOUNTER — Telehealth: Payer: Self-pay

## 2014-06-05 NOTE — Telephone Encounter (Signed)
Patient called stated that he noticed that he has been double dosing on the Concerta 18 mg. He wants to know if there is anything that he should do or be aware of. Rhonda Cunningham,CMA

## 2014-06-05 NOTE — Telephone Encounter (Signed)
He should be taking 54 mg in the morning and 18 in the afternoon, if he accidentally took an additional 18, he should just stop and go back to his usual dose.

## 2014-06-05 NOTE — Telephone Encounter (Signed)
Left message on patient vm advising of the info below. Rhonda Cunningham,CMA

## 2014-06-23 ENCOUNTER — Other Ambulatory Visit: Payer: Self-pay | Admitting: Sports Medicine

## 2014-06-23 DIAGNOSIS — F909 Attention-deficit hyperactivity disorder, unspecified type: Secondary | ICD-10-CM

## 2014-06-23 MED ORDER — METHYLPHENIDATE HCL ER (OSM) 54 MG PO TBCR: 54.0000 mg | EXTENDED_RELEASE_TABLET | ORAL | Status: DC

## 2014-06-23 MED ORDER — METHYLPHENIDATE HCL ER (OSM) 18 MG PO TBCR: 18.0000 mg | EXTENDED_RELEASE_TABLET | Freq: Every day | ORAL | Status: DC

## 2014-08-19 ENCOUNTER — Encounter: Payer: Self-pay | Admitting: Sports Medicine

## 2014-08-19 ENCOUNTER — Ambulatory Visit (INDEPENDENT_AMBULATORY_CARE_PROVIDER_SITE_OTHER): Payer: 59 | Admitting: Sports Medicine

## 2014-08-19 VITALS — BP 139/79 | HR 114 | Ht 69.0 in | Wt 234.0 lb

## 2014-08-19 DIAGNOSIS — Z23 Encounter for immunization: Secondary | ICD-10-CM

## 2014-08-19 DIAGNOSIS — F909 Attention-deficit hyperactivity disorder, unspecified type: Secondary | ICD-10-CM

## 2014-08-19 DIAGNOSIS — Z0000 Encounter for general adult medical examination without abnormal findings: Secondary | ICD-10-CM

## 2014-08-19 DIAGNOSIS — R635 Abnormal weight gain: Secondary | ICD-10-CM | POA: Insufficient documentation

## 2014-08-19 DIAGNOSIS — E669 Obesity, unspecified: Secondary | ICD-10-CM | POA: Insufficient documentation

## 2014-08-19 DIAGNOSIS — F900 Attention-deficit hyperactivity disorder, predominantly inattentive type: Secondary | ICD-10-CM

## 2014-08-19 MED ORDER — PHENTERMINE HCL 37.5 MG PO CAPS: ORAL_CAPSULE | ORAL | Status: DC

## 2014-08-19 NOTE — Assessment & Plan Note (Signed)
Annual physical exam as above. Tetanus booster given today. Return in one year for this.

## 2014-08-19 NOTE — Assessment & Plan Note (Signed)
Starting phentermine, return monthly for weight checks and refills. 

## 2014-08-19 NOTE — Progress Notes (Signed)
  Subjective:    CC: CPE  HPI:  [name] [name], he has no complaints, he is up-to-date on all vaccinations.  ADHD: Well controlled with Concerta dosing  Obesity: Amenable to try weight loss medication.  Past medical history, Surgical history, Family history not pertinant except as noted below, Social history, Allergies, and medications have been entered into the medical record, reviewed, and no changes needed.   Review of Systems: No headache, visual changes, nausea, vomiting, diarrhea, constipation, dizziness, abdominal pain, skin rash, fevers, chills, night sweats, swollen lymph nodes, weight loss, chest pain, body aches, joint swelling, muscle aches, shortness of breath, mood changes, visual or auditory hallucinations.  Objective:    General: Well Developed, well nourished, and in no acute distress.  Neuro: Alert and oriented x3, extra-ocular muscles intact, sensation grossly intact. Cranial nerves II through XII are intact, motor, sensory, and coordinative functions are all intact. HEENT: Normocephalic, atraumatic, pupils equal round reactive to light, neck supple, no masses, no lymphadenopathy, thyroid nonpalpable. Oropharynx, nasopharynx, external ear canals are unremarkable. Skin: Warm and dry, no rashes noted.  Cardiac: Regular rate and rhythm, no murmurs rubs or gallops.  Respiratory: Clear to auscultation bilaterally. Not using accessory muscles, speaking in full sentences.  Abdominal: Soft, nontender, nondistended, positive bowel sounds, no masses, no organomegaly.  Musculoskeletal: Shoulder, elbow, wrist, hip, knee, ankle stable, and with full range of motion.  Impression and Recommendations:    The patient was counselled, risk factors were discussed, anticipatory guidance given.

## 2014-08-19 NOTE — Assessment & Plan Note (Signed)
Continue current medications, no changes

## 2014-09-16 ENCOUNTER — Encounter: Payer: Self-pay | Admitting: Sports Medicine

## 2014-09-16 ENCOUNTER — Ambulatory Visit (INDEPENDENT_AMBULATORY_CARE_PROVIDER_SITE_OTHER): Payer: 59 | Admitting: Sports Medicine

## 2014-09-16 VITALS — BP 130/82 | HR 94 | Ht 69.0 in | Wt 222.0 lb

## 2014-09-16 DIAGNOSIS — E669 Obesity, unspecified: Secondary | ICD-10-CM

## 2014-09-16 DIAGNOSIS — F909 Attention-deficit hyperactivity disorder, unspecified type: Secondary | ICD-10-CM

## 2014-09-16 DIAGNOSIS — F900 Attention-deficit hyperactivity disorder, predominantly inattentive type: Secondary | ICD-10-CM

## 2014-09-16 MED ORDER — METHYLPHENIDATE HCL ER (OSM) 18 MG PO TBCR: 18.0000 mg | EXTENDED_RELEASE_TABLET | Freq: Every day | ORAL | Status: DC

## 2014-09-16 MED ORDER — METHYLPHENIDATE HCL ER (OSM) 54 MG PO TBCR: 54.0000 mg | EXTENDED_RELEASE_TABLET | ORAL | Status: DC

## 2014-09-16 MED ORDER — PHENTERMINE HCL 37.5 MG PO CAPS: ORAL_CAPSULE | ORAL | Status: DC

## 2014-09-16 NOTE — Assessment & Plan Note (Signed)
Excellent weight loss, 12 pounds after a single month of phentermine. Refilling medicine, return in one month.

## 2014-09-16 NOTE — Progress Notes (Signed)
  Subjective:    CC: Follow-up   HPI: Obesity: 12 pound weight loss in the first month on phentermine.  Adult ADD: Needs a refill. Doing well.  Past medical history, Surgical history, Family history not pertinant except as noted below, Social history, Allergies, and medications have been entered into the medical record, reviewed, and no changes needed.   Review of Systems: No fevers, chills, night sweats, weight loss, chest pain, or shortness of breath.   Objective:    General: Well Developed, well nourished, and in no acute distress.  Neuro: Alert and oriented x3, extra-ocular muscles intact, sensation grossly intact.  HEENT: Normocephalic, atraumatic, pupils equal round reactive to light, neck supple, no masses, no lymphadenopathy, thyroid nonpalpable.  Skin: Warm and dry, no rashes. Cardiac: Regular rate and rhythm, no murmurs rubs or gallops, no lower extremity edema.  Respiratory: Clear to auscultation bilaterally. Not using accessory muscles, speaking in full sentences.  Impression and Recommendations:

## 2014-09-16 NOTE — Assessment & Plan Note (Signed)
Refilling concerta

## 2014-10-22 ENCOUNTER — Encounter: Payer: Self-pay | Admitting: Sports Medicine

## 2014-10-22 ENCOUNTER — Ambulatory Visit (INDEPENDENT_AMBULATORY_CARE_PROVIDER_SITE_OTHER): Payer: 59 | Admitting: Sports Medicine

## 2014-10-22 DIAGNOSIS — E669 Obesity, unspecified: Secondary | ICD-10-CM

## 2014-10-22 MED ORDER — PHENTERMINE HCL 37.5 MG PO TABS: 18.7500 mg | ORAL_TABLET | Freq: Two times a day (BID) | ORAL | Status: DC

## 2014-10-22 NOTE — Assessment & Plan Note (Signed)
Good continue weight loss, this would be another 5 pounds taking the total to approximate 17 pounds. We are going to start his third month of phentermine, he continues to have some evening urges so we will do one half tab twice a day.

## 2014-10-22 NOTE — Progress Notes (Signed)
  Subjective:    CC: recheck weight  HPI: [name] [name] returns, he is now 2 months in the phentermine, the first month he lost 12 pounds, this month he lost 5 pounds, he does blame it on persistent appetite later in the day, as well as the holidays. He has no side effects, happy with the results so far, and eager to continue weight loss.  Past medical history, Surgical history, Family history not pertinant except as noted below, Social history, Allergies, and medications have been entered into the medical record, reviewed, and no changes needed.   Review of Systems: No fevers, chills, night sweats, weight loss, chest pain, or shortness of breath.   Objective:    General: Well Developed, well nourished, and in no acute distress.  Neuro: Alert and oriented x3, extra-ocular muscles intact, sensation grossly intact.  HEENT: Normocephalic, atraumatic, pupils equal round reactive to light, neck supple, no masses, no lymphadenopathy, thyroid nonpalpable.  Skin: Warm and dry, no rashes. Cardiac: Regular rate and rhythm, no murmurs rubs or gallops, no lower extremity edema.  Respiratory: Clear to auscultation bilaterally. Not using accessory muscles, speaking in full sentences.  Impression and Recommendations:

## 2014-11-13 ENCOUNTER — Encounter: Payer: Self-pay | Admitting: Emergency Medicine

## 2014-11-13 ENCOUNTER — Emergency Department
Admission: EM | Admit: 2014-11-13 | Discharge: 2014-11-13 | Disposition: A | Discharge status: Home / Self Care | Payer: 59 | Source: Home / Self Care | Attending: Emergency Medicine | Admitting: Emergency Medicine

## 2014-11-13 DIAGNOSIS — J029 Acute pharyngitis, unspecified: Secondary | ICD-10-CM

## 2014-11-13 LAB — POCT RAPID STREP A (OFFICE): Rapid Strep A Screen: NEGATIVE

## 2014-11-13 NOTE — ED Notes (Signed)
Sore throat x 3 days

## 2014-11-13 NOTE — ED Provider Notes (Signed)
CSN: [id number]     Arrival date & time 11/13/14  0845 History   None    Chief Complaint  Patient presents with  . Sore Throat   (Consider location/radiation/quality/duration/timing/severity/associated sxs/prior Treatment) HPI SORE THROAT Onset: 3 days    Severity: moderate Has not tried any particular medication..  Symptoms:  No Fever  No Swollen neck glands No Recent Strep Exposure     No Myalgias. Energy level within normal limits No Headache No Rash  Has minimal clearish postnasal drainage. No Discolored Nasal Mucus No Allergy symptoms No sinus pain/pressure No itchy/red eyes No earache  No Drooling No Trismus  No Nausea No Vomiting No Abdominal pain No Diarrhea No Reflux symptoms  No Cough No Breathing Difficulty No Shortness of Breath No pleuritic pain No Wheezing No Hemoptysis   History reviewed. No pertinent past medical history. Past Surgical History  Procedure Laterality Date  . Inner ear surgery     Family History  Problem Relation Age of Onset  . Cancer Father   . Hypertension Father   . Cancer Paternal Uncle   . Stroke Maternal Grandfather   . Hypertension Paternal Grandmother   . Cancer Paternal Grandfather   . Alzheimer's disease Paternal Grandfather    History  Substance Use Topics  . Smoking status: Never Smoker   . Smokeless tobacco: Not on file  . Alcohol Use: No    Review of Systems  All other systems reviewed and are negative.   Allergies  Review of patient's allergies indicates no known allergies.  Home Medications   Prior to Admission medications   Medication Sig Start Date End Date Taking? Authorizing Provider  fluticasone (FLONASE) 50 MCG/ACT nasal spray One spray in each nostril twice a day, use left hand for right nostril, and right hand for left nostril. 09/02/13   Silverio Decamp, MD  methylphenidate (CONCERTA) 18 MG PO CR tablet Take 1 tablet (18 mg total) by mouth daily. In the early afternoon 09/16/14    Silverio Decamp, MD  methylphenidate 54 MG PO CR tablet Take 1 tablet (54 mg total) by mouth every morning. 09/16/14   Silverio Decamp, MD  phentermine (ADIPEX-P) 37.5 MG tablet Take 0.5 tablets (18.75 mg total) by mouth 2 (two) times daily. 10/22/14   Silverio Decamp, MD  tretinoin (RETIN-A) 0.1 % cream Apply topically at bedtime. 06/19/13   Silverio Decamp, MD   BP 126/76 mmHg  Pulse 72  Temp(Src) 98.1 F (36.7 C) (Oral)  Ht 5\' 9"  (1.753 m)  Wt 207 lb (93.895 kg)  BMI 30.55 kg/m2  SpO2 100% Physical Exam  Constitutional: He is oriented to person, place, and time. He appears well-developed and well-nourished. He is cooperative.  Non-toxic appearance. No distress.  HENT:  Head: Normocephalic and atraumatic.  Right Ear: Tympanic membrane, external ear and ear canal normal.  Left Ear: Tympanic membrane, external ear and ear canal normal.  Nose: Nose normal. Right sinus exhibits no maxillary sinus tenderness and no frontal sinus tenderness. Left sinus exhibits no maxillary sinus tenderness and no frontal sinus tenderness.  Mouth/Throat: Mucous membranes are normal. Posterior oropharyngeal erythema present. No oropharyngeal exudate or posterior oropharyngeal edema.  Eyes: Conjunctivae are normal. No scleral icterus.  Neck: Neck supple.  Cardiovascular: Normal rate, regular rhythm and normal heart sounds.   No murmur heard. Pulmonary/Chest: Effort normal and breath sounds normal. No stridor. No respiratory distress. He has no wheezes. He has no rales.  Musculoskeletal: He exhibits no edema.  Lymphadenopathy:    He has cervical adenopathy.       Right cervical: Superficial cervical adenopathy present. No deep cervical and no posterior cervical adenopathy present.      Left cervical: Superficial cervical adenopathy present. No deep cervical and no posterior cervical adenopathy present.  Neurological: He is alert and oriented to person, place, and time.  Skin: Skin is  warm and dry.  Psychiatric: He has a normal mood and affect.  Nursing note and vitals reviewed.   ED Course  Procedures (including critical care time) Labs Review Labs Reviewed - No data to display  Imaging Review No results found.   MDM   1. Acute pharyngitis, unspecified pharyngitis type    Rapid strep test negative. Explained that this is likely a viral pharyngitis. Explained antibiotics not indicated for viral pharyngitis, and patient agrees. Discussed symptomatic care. Written and verbal instructions given. Ibuprofen 400 mg by mouth stat given.  Send throat culture. Follow-up with your primary care doctor in 5-7 days if not improving, or sooner if symptoms become worse. Precautions discussed. Red flags discussed. Questions invited and answered. Patient voiced understanding and agreement.     Jacqulyn Cane, MD 11/13/14 [phone]

## 2014-11-14 LAB — STREP A DNA PROBE: GASP: NEGATIVE

## 2014-11-16 ENCOUNTER — Telehealth: Payer: Self-pay | Admitting: Emergency Medicine

## 2014-11-19 ENCOUNTER — Ambulatory Visit: Payer: 59 | Admitting: Sports Medicine

## 2014-11-21 ENCOUNTER — Ambulatory Visit (INDEPENDENT_AMBULATORY_CARE_PROVIDER_SITE_OTHER): Payer: 59 | Admitting: Sports Medicine

## 2014-11-21 ENCOUNTER — Encounter: Payer: Self-pay | Admitting: Sports Medicine

## 2014-11-21 VITALS — BP 121/74 | HR 64 | Ht 69.0 in | Wt 204.0 lb

## 2014-11-21 DIAGNOSIS — E669 Obesity, unspecified: Secondary | ICD-10-CM

## 2014-11-21 MED ORDER — PHENTERMINE HCL 37.5 MG PO TABS: 18.7500 mg | ORAL_TABLET | Freq: Two times a day (BID) | ORAL | Status: DC

## 2014-11-21 NOTE — Assessment & Plan Note (Signed)
Excellent continued weight loss as we enter the fourth month, 30 pound total weight loss. Refilling medication, return in a month.

## 2014-11-21 NOTE — Progress Notes (Signed)
  Subjective:    CC: follow-up  HPI: Obesity: It's now been 3 months, and injured his lost 30 pounds, he's happy with results so far and eager to do the subsequent 3 months.  Past medical history, Surgical history, Family history not pertinant except as noted below, Social history, Allergies, and medications have been entered into the medical record, reviewed, and no changes needed.   Review of Systems: No fevers, chills, night sweats, weight loss, chest pain, or shortness of breath.   Objective:    General: Well Developed, well nourished, and in no acute distress.  Neuro: Alert and oriented x3, extra-ocular muscles intact, sensation grossly intact.  HEENT: Normocephalic, atraumatic, pupils equal round reactive to light, neck supple, no masses, no lymphadenopathy, thyroid nonpalpable.  Skin: Warm and dry, no rashes. Cardiac: Regular rate and rhythm, no murmurs rubs or gallops, no lower extremity edema.  Respiratory: Clear to auscultation bilaterally. Not using accessory muscles, speaking in full sentences.  Impression and Recommendations:

## 2014-12-18 ENCOUNTER — Ambulatory Visit (INDEPENDENT_AMBULATORY_CARE_PROVIDER_SITE_OTHER): Payer: 59 | Admitting: Sports Medicine

## 2014-12-18 ENCOUNTER — Encounter: Payer: Self-pay | Admitting: Sports Medicine

## 2014-12-18 VITALS — BP 123/82 | HR 94 | Ht 69.0 in | Wt 203.0 lb

## 2014-12-18 DIAGNOSIS — F909 Attention-deficit hyperactivity disorder, unspecified type: Secondary | ICD-10-CM

## 2014-12-18 DIAGNOSIS — F900 Attention-deficit hyperactivity disorder, predominantly inattentive type: Secondary | ICD-10-CM

## 2014-12-18 DIAGNOSIS — M722 Plantar fascial fibromatosis: Secondary | ICD-10-CM | POA: Diagnosis not present

## 2014-12-18 DIAGNOSIS — E669 Obesity, unspecified: Secondary | ICD-10-CM

## 2014-12-18 MED ORDER — PHENTERMINE HCL 37.5 MG PO TABS: 18.7500 mg | ORAL_TABLET | Freq: Two times a day (BID) | ORAL | Status: DC

## 2014-12-18 MED ORDER — METHYLPHENIDATE HCL ER (OSM) 54 MG PO TBCR: 54.0000 mg | EXTENDED_RELEASE_TABLET | ORAL | Status: DC

## 2014-12-18 MED ORDER — METHYLPHENIDATE HCL ER (OSM) 18 MG PO TBCR: 18.0000 mg | EXTENDED_RELEASE_TABLET | Freq: Every day | ORAL | Status: DC

## 2014-12-18 NOTE — Assessment & Plan Note (Signed)
Gel cups, rehabilitation exercises, return to see me for custom orthotics.

## 2014-12-18 NOTE — Assessment & Plan Note (Addendum)
31 pound total weight loss after 4 months, 1 pound since the last month. Continue running, add some upper body resistance training. Refilling medication, return in one month.

## 2014-12-18 NOTE — Progress Notes (Signed)
  Subjective:    CC: Follow-up  HPI: Obesity: As we enter the fifth month, he has lost 31 total pounds, 1 pound since the last month.  Heel pain: Moderate, persistent, localized on the left heel after running, worse for the first few steps in the morning and after sitting for a period of time. Worse in the plantar aspect of the heel.  Past medical history, Surgical history, Family history not pertinant except as noted below, Social history, Allergies, and medications have been entered into the medical record, reviewed, and no changes needed.   Review of Systems: No fevers, chills, night sweats, weight loss, chest pain, or shortness of breath.   Objective:    General: Well Developed, well nourished, and in no acute distress.  Neuro: Alert and oriented x3, extra-ocular muscles intact, sensation grossly intact.  HEENT: Normocephalic, atraumatic, pupils equal round reactive to light, neck supple, no masses, no lymphadenopathy, thyroid nonpalpable.  Skin: Warm and dry, no rashes. Cardiac: Regular rate and rhythm, no murmurs rubs or gallops, no lower extremity edema.  Respiratory: Clear to auscultation bilaterally. Not using accessory muscles, speaking in full sentences. Left Foot: No visible erythema or swelling. Range of motion is full in all directions. Strength is 5/5 in all directions. No hallux valgus. No pes cavus or pes planus. No abnormal callus noted. No pain over the navicular prominence, or base of fifth metatarsal. Tender to palpation of the calcaneal insertion of plantar fascia. No pain at the Achilles insertion. No pain over the calcaneal bursa. No pain of the retrocalcaneal bursa. No tenderness to palpation over the tarsals, metatarsals, or phalanges. No hallux rigidus or limitus. No tenderness palpation over interphalangeal joints. No pain with compression of the metatarsal heads. Neurovascularly intact distally.  Impression and Recommendations:

## 2014-12-18 NOTE — Assessment & Plan Note (Signed)
Stable on 72 mg of Concerta. Refilling medication.

## 2015-01-19 ENCOUNTER — Encounter: Payer: 59 | Admitting: Sports Medicine

## 2015-01-28 ENCOUNTER — Encounter: Payer: Self-pay | Admitting: Sports Medicine

## 2015-01-28 ENCOUNTER — Ambulatory Visit (INDEPENDENT_AMBULATORY_CARE_PROVIDER_SITE_OTHER): Payer: 59 | Admitting: Sports Medicine

## 2015-01-28 VITALS — BP 127/75 | HR 76 | Temp 97.8°F | Wt 196.0 lb

## 2015-01-28 DIAGNOSIS — E669 Obesity, unspecified: Secondary | ICD-10-CM

## 2015-01-28 DIAGNOSIS — M722 Plantar fascial fibromatosis: Secondary | ICD-10-CM | POA: Diagnosis not present

## 2015-01-28 MED ORDER — PHENTERMINE HCL 37.5 MG PO TABS: 18.7500 mg | ORAL_TABLET | Freq: Every day | ORAL | Status: DC

## 2015-01-28 NOTE — Assessment & Plan Note (Signed)
Good continued weight loss after 6 months. Refilling with 3 months supply, 1/2 tab.

## 2015-01-28 NOTE — Assessment & Plan Note (Signed)
Orthotics as above. 

## 2015-01-28 NOTE — Progress Notes (Signed)

## 2015-03-23 ENCOUNTER — Telehealth: Payer: Self-pay | Admitting: Family

## 2015-03-23 DIAGNOSIS — M546 Pain in thoracic spine: Secondary | ICD-10-CM

## 2015-03-23 MED ORDER — CYCLOBENZAPRINE HCL 10 MG PO TABS: 10.0000 mg | ORAL_TABLET | Freq: Three times a day (TID) | ORAL | Status: DC | PRN

## 2015-03-23 MED ORDER — ETODOLAC 300 MG PO CAPS: 300.0000 mg | ORAL_CAPSULE | Freq: Two times a day (BID) | ORAL | Status: DC

## 2015-03-23 NOTE — Progress Notes (Signed)
We are sorry that you are not feeling well.  Here is how we plan to help!  Based on what you have shared with me it looks like you mostly have acute back pain.  Acute back pain is defined as musculoskeletal pain that can resolve in 1-3 weeks with conservative treatment.  I have prescribed Etodolac 300 mg twice a day non-steroid anti-inflammatory (NSAID) as well as Flexeril 10 mg every eight hours as needed which is a muscle relaxer.  Some patients experience stomach irritation or in increased heartburn with anti-inflammatory drugs.  Please keep in mind that muscle relaxer's can cause fatigue and should not be taken while at work or driving.  Back pain is very common.  The pain often gets better over time.  The cause of back pain is usually not dangerous.  Most people can learn to manage their back pain on their own.  Home Care  Stay active.  Start with short walks on flat ground if you can.  Try to walk farther each day.  Do not sit, drive or stand in one place for more than 30 minutes.  Do not stay in bed.  Do not avoid exercise or work.  Activity can help your back heal faster.  Be careful when you bend or lift an object.  Bend at your knees, keep the object close to you, and do not twist.  Sleep on a firm mattress.  Lie on your side, and bend your knees.  If you lie on your back, put a pillow under your knees.  Only take medicines as told by your doctor.  Put ice on the injured area.  Put ice in a plastic bag  Place a towel between your skin and the bag  Leave the ice on for 15-20 minutes, 3-4 times a day for the first 2-3 days.  After that, you can switch between ice and heat packs.  Ask your doctor about back exercises or massage.  Avoid feeling anxious or stressed.  Find good ways to deal with stress, such as exercise.  Get Help Right Way If:  Your pain does not go away with rest or medicine.  Your pain does not go away in 1 week.  You have new problems.  You do not  feel well.  The pain spreads into your legs.  You cannot control when you poop (bowel movement) or pee (urinate)  You feel sick to your stomach (nauseous) or throw up (vomit)  You have belly (abdominal) pain.  You feel like you may pass out (faint).  If you develop a fever.  Make Sure you:  Understand these instructions.  Will watch your condition  Will get help right away if you are not doing well or get worse.  Your e-visit answers were reviewed by a board certified advanced clinical practitioner to complete your personal care plan.  Depending on the condition, your plan could have included both over the counter or prescription medications.  If there is a problem please reply  once you have received a response from your provider.  Your safety is important to [place].  If you have drug allergies check your prescription carefully.    You can use MyChart to ask questions about today's visit, request a non-urgent call back, or ask for a work or school excuse.  You will get an e-mail in the next two days asking about your experience.  I hope that your e-visit has been valuable and will speed your recovery. Thank you  for using e-visits.

## 2015-04-21 ENCOUNTER — Other Ambulatory Visit: Payer: Self-pay | Admitting: Sports Medicine

## 2015-04-22 ENCOUNTER — Other Ambulatory Visit: Payer: Self-pay

## 2015-04-22 DIAGNOSIS — F909 Attention-deficit hyperactivity disorder, unspecified type: Secondary | ICD-10-CM

## 2015-04-22 MED ORDER — METHYLPHENIDATE HCL ER (OSM) 18 MG PO TBCR: 18.0000 mg | EXTENDED_RELEASE_TABLET | Freq: Every day | ORAL | Status: DC

## 2015-04-22 MED ORDER — METHYLPHENIDATE HCL ER (OSM) 54 MG PO TBCR: 54.0000 mg | EXTENDED_RELEASE_TABLET | ORAL | Status: DC

## 2015-04-22 NOTE — Telephone Encounter (Signed)
Patient advised to schedule a follow up before more refills.

## 2015-08-18 ENCOUNTER — Ambulatory Visit (FREE_STANDING_LABORATORY_FACILITY): Payer: Commercial Managed Care - HMO | Admitting: Family

## 2015-08-18 ENCOUNTER — Encounter (INDEPENDENT_AMBULATORY_CARE_PROVIDER_SITE_OTHER): Payer: Self-pay

## 2015-08-18 VITALS — BP 137/79 | HR 65 | Temp 98.6°F | Resp 18 | Ht 70.0 in | Wt 155.0 lb

## 2015-08-18 DIAGNOSIS — J029 Acute pharyngitis, unspecified: Secondary | ICD-10-CM

## 2015-08-18 LAB — POCT RAPID STREP A: Rapid Strep A Screen POCT: NEGATIVE

## 2015-08-18 MED ORDER — AZITHROMYCIN 250 MG PO TABS
ORAL_TABLET | ORAL | Status: AC
Start: 2015-08-18 — End: 2015-08-23

## 2015-08-18 NOTE — Patient Instructions (Signed)
Pharyngitis (Sore Throat), Report Pending      Pharyngitis (sore throat) is often due to a virus. It can also be caused by the"strep"streptococcus, or strep,bacteria,oftencalled"strep throat"strep throat. Both viral and strep infectionscan cause throat pain that is worse when swallowing, aching all over with headache,and fever. Both types of infections are contagious. They may be spread by coughing, kissing,or touching others after touching your mouth or nose.  A test has been done to determine whether or not you (or your child, if your child is the patient) have strep throat. Call this facility as directed for the result. If the test ispositivefor strep infection, treament with antibiotic medications is needed. A prescription can be called in to your pharmacy at that time. If the test isnegative,you probably have a viral pharyngitis. Thisdoes notrequire antibiotic treatment.Until you receive the results of the strep test, you should stay home from work. If your child is being tested, he or she should stay home from school.  Home care   Rest at home. Drink plenty of fluids to avoid dehydration.   If the test is positive for strep, no work or school for the first 2 days of taking the antibiotics. After this time, you will not be contagious. You can then return to work or school if you are feeling better.   The antibiotic medication must be taken for the full 10 days, even if you feel better. This is very important to ensure the infection is treated. It is also important to prevent drug-resistent organisms from developing.If you were given an antibiotic shot, no more antibiotics are needed.   For children:Use acetaminophen for fever, fussiness or discomfort. In infants over six months of age, you may use ibuprofen instead of acetaminophen. (NOTE: If your child has chronic liver or kidney disease or ever had a stomach ulcer or GI bleeding, talk with your doctor before using these medicines.)  (NOTE: Aspirin should never be used in anyone under 18 years of age who is ill with a fever. It may cause severe liver damage.)   For adults:You may use acetaminophen or ibuprofen to control pain or fever, unless another medicine was prescribed for this. (NOTE: If you have chronic liver or kidney disease or ever had a stomach ulcer or GI bleeding, talk with your doctor before using these medicines.)   Throat lozenges or numbing throat sprays can help reduce pain. Gargling with warm salt water will also help reduce throat pain. For this, dissolve 1/2 teaspoon of salt in 1 glass of warm water. To help soothe a sore throat, children can sip on juice or a popsicle. Children 5 years and older can also suck on a lollipop or hard candy.   Avoid salty or spicy foods, which can irritate the throat.  Follow-up care  Follow up with your healthcare provider or our staff if you are not improving over the next week.  When to seek medical advice  Call your healthcare provider right away if any of these occur:   Feveras directed by your doctor. For children, seek care if:   Your child is of any age and has repeated fevers above 104F (40C).   Your child is younger than 2 years of age and has a fever of 100.4F (38C) that continues for more than 1 day.   Your child is 2 years old or older and has a fever of 100.4F (38C) that continues for more than 3 days.   New or worsening ear pain, sinus pain, or   headache   Painful lumps in the back of neck   Stiff neck   Lymph nodes are getting larger   Inability to swallow liquids, excessive drooling,or inability to open mouth wide due to throat pain   Signs of dehydration (very dark urine or no urine, sunken eyes, dizziness)   Trouble breathing or noisy breathing   Muffled voice   New rash   Child appears to be getting sicker   2000-2015 [employer] [address], [place] [address]. All rights reserved. This information is not intended as a  substitute for professional medical care. Always follow your healthcare professional's instructions.

## 2015-08-18 NOTE — Progress Notes (Signed)
[facility]  PROGRESS NOTE     Patient: Jacob Pratt   Date: 08/18/2015   MRN: [id number]       Jacob Pratt is a 28 y.o. male      SUBJECTIVE     Chief Complaint   Patient presents with   . Sore Throat     Patient states that he started having a sore throat last night. Patient denies taking any medication for the pain but has taken cough drops.          Sore Throat   This is a new problem. The current episode started yesterday. The problem has been unchanged. The pain is worse on the left side. There has been no fever. The fever has been present for less than 1 day. The pain is at a severity of 6/10. The pain is moderate. Associated symptoms include congestion. Pertinent negatives include no abdominal pain, coughing, diarrhea, drooling, ear discharge, ear pain, headaches, hoarse voice, plugged ear sensation, neck pain, shortness of breath, stridor, swollen glands, trouble swallowing or vomiting. He has had no exposure to strep or mono. He has tried NSAIDs for the symptoms. The treatment provided mild relief.       Review of Systems   HENT: Positive for congestion and sore throat. Negative for drooling, ear discharge, ear pain, hoarse voice and trouble swallowing.    Respiratory: Negative for cough, shortness of breath and stridor.    Gastrointestinal: Negative for vomiting, abdominal pain and diarrhea.   Musculoskeletal: Negative for neck pain.   Neurological: Negative for headaches.   All other systems reviewed and are negative.      The following portions of the patient's history were reviewed and updated as appropriate: Allergies, Current Medications, Past Family History, Past Medical history, Past social history, Past surgical history, and Problem List.    OBJECTIVE     Vitals   Filed Vitals:    08/18/15 1610   BP: 137/79   Pulse: 65   Temp: 98.6 F (37 C)   TempSrc: Oral   Resp: 18   Height: 1.778 m (5\' 10" )   Weight: 70.308 kg (155 lb)       Physical Exam   Nursing note and vitals  reviewed.  Constitutional: He is oriented to person, place, and time. He appears well-developed.   HENT:   Head: Normocephalic.   Right Ear: Hearing, tympanic membrane, external ear and ear canal normal.   Left Ear: Hearing, tympanic membrane, external ear and ear canal normal.   Nose: Rhinorrhea present. Right sinus exhibits no maxillary sinus tenderness and no frontal sinus tenderness. Left sinus exhibits no maxillary sinus tenderness and no frontal sinus tenderness.   Mouth/Throat: Uvula is midline and mucous membranes are normal. Posterior oropharyngeal erythema present.       Neck: Normal range of motion. Neck supple.   Cardiovascular: Normal rate, regular rhythm and normal heart sounds.    Pulmonary/Chest: Effort normal and breath sounds normal.   Neurological: He is alert and oriented to person, place, and time.   Skin: Skin is warm.   Psychiatric: He has a normal mood and affect.       Lab Results (24 Hour)   Results     Procedure Component Value Units Date/Time    Rapid Group A Strep [[id number]]  (Normal) Collected:  08/18/15 1620    Specimen Information:  Throat Updated:  08/18/15 1629     POCT QC Pass  Rapid Strep A Screen POCT Negative       Comment        Result:        Negative Results should be confirmed by throat Cx to confirm absence of Strep A inf.          Radiology Results (24 Hour)     ** No results found for the last 24 hours. **          ASSESSMENT     Encounter Diagnosis   Name Primary?   . Pharyngitis, unspecified etiology Yes          PLAN     Procedures    1. Pharyngitis, unspecified etiology  - Rapid Group A Strep  - Culture, Throat (IL/QU)    Azithromycin  Pt was given discharge instructions. Pt verbalized clear understanding of d/c instructions. No questions asked.    An After Visit Summary was printed and given to the patient.      Signed,  Milana Kidney, NP  08/18/2015

## 2015-08-23 ENCOUNTER — Telehealth (INDEPENDENT_AMBULATORY_CARE_PROVIDER_SITE_OTHER): Payer: Self-pay

## 2015-08-23 NOTE — Telephone Encounter (Signed)
-----   Message from Milana Kidney, NP sent at 08/21/2015  7:49 PM EST -----  Please inform pt throat cx is negative

## 2015-08-23 NOTE — Telephone Encounter (Signed)
Called pt informed him of lab result, pt verbalized understanding. JF LPN

## 2015-08-27 ENCOUNTER — Telehealth: Payer: Self-pay

## 2015-08-27 DIAGNOSIS — F909 Attention-deficit hyperactivity disorder, unspecified type: Secondary | ICD-10-CM

## 2015-08-27 MED ORDER — METHYLPHENIDATE HCL ER (OSM) 18 MG PO TBCR: 18.0000 mg | EXTENDED_RELEASE_TABLET | Freq: Every day | ORAL | Status: DC

## 2015-08-27 MED ORDER — METHYLPHENIDATE HCL ER (OSM) 54 MG PO TBCR: 54.0000 mg | EXTENDED_RELEASE_TABLET | ORAL | Status: DC

## 2015-08-27 NOTE — Telephone Encounter (Signed)
Patient request refill  Concerta 54 mg and Concerta 18 mg. Rhonda Cunningham,CMA

## 2015-10-09 ENCOUNTER — Ambulatory Visit (INDEPENDENT_AMBULATORY_CARE_PROVIDER_SITE_OTHER): Payer: 59

## 2015-10-09 ENCOUNTER — Encounter: Payer: Self-pay | Admitting: Sports Medicine

## 2015-10-09 ENCOUNTER — Ambulatory Visit (INDEPENDENT_AMBULATORY_CARE_PROVIDER_SITE_OTHER): Payer: 59 | Admitting: Sports Medicine

## 2015-10-09 VITALS — BP 123/79 | HR 77 | Temp 97.9°F | Resp 16 | Wt 233.3 lb

## 2015-10-09 DIAGNOSIS — M545 Low back pain, unspecified: Secondary | ICD-10-CM

## 2015-10-09 DIAGNOSIS — Q788 Other specified osteochondrodysplasias: Secondary | ICD-10-CM | POA: Insufficient documentation

## 2015-10-09 MED ORDER — MELOXICAM 15 MG PO TABS: ORAL_TABLET | ORAL | Status: DC

## 2015-10-09 MED ORDER — PREDNISONE 50 MG PO TABS: ORAL_TABLET | ORAL | Status: DC

## 2015-10-09 MED FILL — predniSONE 50 MG TABS: 50 | 5 days supply | Qty: 5 | Fill #0

## 2015-10-09 MED FILL — MELOXICAM 15 MG TABLET: 15 | 30 days supply | Qty: 30 | Fill #0

## 2015-10-09 NOTE — Progress Notes (Signed)
  Subjective:    CC: Low back pain  HPI:  This pleasant 30 year old male who for the last several months on and off has had right-sided low back pain with radiation across the back, worse with sitting. He radiculopathy, motion, saddle numbness, constitutional symptoms, no trauma. He tried etodolac prescribed to him during a visit without much improvement.  Past medical history, Surgical history, Family history not pertinant except as noted below, Social history, Allergies, and medications have been entered into the medical record, reviewed, and no changes needed.   Review of Systems: No fevers, chills, night sweats, weight loss, chest pain, or shortness of breath.   Objective:    General: Well Developed, well nourished, and in no acute distress.  Neuro: Alert and oriented x3, extra-ocular muscles intact, sensation grossly intact.  HEENT: Normocephalic, atraumatic, pupils equal round reactive to light, neck supple, no masses, no lymphadenopathy, thyroid nonpalpable.  Skin: Warm and dry, no rashes. Cardiac: Regular rate and rhythm, no murmurs rubs or gallops, no lower extremity edema.  Respiratory: Clear to auscultation bilaterally. Not using accessory muscles, speaking in full sentences. Back Exam:  Inspection: Unremarkable  Motion: Flexion 45 deg, Extension 45 deg, Side Bending to 45 deg bilaterally,  Rotation to 45 deg bilaterally  SLR laying: Negative  XSLR laying: Negative  Palpable tenderness: None. FABER: negative. Sensory change: Gross sensation intact to all lumbar and sacral dermatomes.  Reflexes: 2+ at both patellar tendons, 2+ at achilles tendons, Babinski's downgoing.  Strength at foot  Plantar-flexion: 5/5 Dorsi-flexion: 5/5 Eversion: 5/5 Inversion: 5/5  Leg strength  Quad: 5/5 Hamstring: 5/5 Hip flexor: 5/5 Hip abductors: 5/5  Gait unremarkable. Exquisitely tight hamstrings  Impression and Recommendations:

## 2015-10-09 NOTE — Assessment & Plan Note (Signed)
Prednisone, meloxicam, x-rays, rehabilitation exercises, return in one month, formal physical therapy if no better.

## 2015-11-09 ENCOUNTER — Encounter: Payer: Self-pay | Admitting: Sports Medicine

## 2015-11-09 ENCOUNTER — Ambulatory Visit (INDEPENDENT_AMBULATORY_CARE_PROVIDER_SITE_OTHER): Payer: 59 | Admitting: Sports Medicine

## 2015-11-09 VITALS — BP 134/84 | HR 85 | Resp 18 | Wt 234.5 lb

## 2015-11-09 DIAGNOSIS — M545 Low back pain, unspecified: Secondary | ICD-10-CM

## 2015-11-09 NOTE — Assessment & Plan Note (Signed)
Improving with rehabilitation exercises and after steroids. Continue rehabilitation exercises and return as needed.

## 2015-11-09 NOTE — Progress Notes (Signed)
  Subjective:    CC: Return to clinic for back pain  HPI: Patient returns to clinic after 1 month of conservative therapy (meloxicam and home exercises) for lower back pain. He reports improvement of symptoms saying that every week it gets better. He notices the pain mainly when he gets up from laying down but also when he reaches or twists his abdomen. The pain has not changed in character and still does not radiate. He denies any shooting pains, weakness, numbness, or tingling. He reports not taking the meloxicam everyday because he feels he doesn't need it for pain control.  Past medical history, Surgical history, Family history not pertinant except as noted below, Social history, Allergies, and medications have been entered into the medical record, reviewed, and no changes needed.   Review of Systems: No fevers, chills, night sweats, weight loss, chest pain, or shortness of breath.   Objective:    General: Well Developed, well nourished, and in no acute distress.  Neuro: Alert and oriented x3, extra-ocular muscles intact, sensation grossly intact.  HEENT: Normocephalic, atraumatic, pupils equal round reactive to light, neck supple, no masses, no lymphadenopathy, thyroid nonpalpable.  Skin: Warm and dry, no rashes. Cardiac: Regular rate and rhythm, no murmurs rubs or gallops, no lower extremity edema.  Respiratory: Clear to auscultation bilaterally. Not using accessory muscles, speaking in full sentences. Back Exam:  Inspection: Unremarkable  Motion: Flexion 45 deg, Extension 45 deg, Side Bending to 45 deg bilaterally,  Rotation to 45 deg bilaterally  SLR laying: Negative  XSLR laying: Negative  Palpable tenderness: None. FABER: negative. Sensory change: Gross sensation intact to all lumbar and sacral dermatomes.  Reflexes: 2+ at both patellar tendons, 2+ at achilles tendons, Babinski's downgoing.  Strength at foot  Plantar-flexion: 5/5 Dorsi-flexion: 5/5 Eversion: 5/5 Inversion: 5/5    Leg strength  Quad: 5/5 Hamstring: 5/5 Hip flexor: 5/5 Hip abductors: 5/5  Gait unremarkable.  Impression and Recommendations:    Patient improving with conservative therapy. Will encourage to continue exercises and use meloxicam as needed. Return as needed.

## 2015-11-16 ENCOUNTER — Other Ambulatory Visit: Payer: Self-pay | Admitting: Sports Medicine

## 2015-11-16 MED FILL — FLUTICASONE PROP 50 MCG SPR: 50 | 90 days supply | Qty: 48 | Fill #0

## 2015-11-17 ENCOUNTER — Other Ambulatory Visit: Payer: Self-pay

## 2016-05-13 ENCOUNTER — Ambulatory Visit (INDEPENDENT_AMBULATORY_CARE_PROVIDER_SITE_OTHER): Payer: 59 | Admitting: Sports Medicine

## 2016-05-13 DIAGNOSIS — E781 Pure hyperglyceridemia: Secondary | ICD-10-CM

## 2016-05-13 DIAGNOSIS — Z8679 Personal history of other diseases of the circulatory system: Secondary | ICD-10-CM

## 2016-05-13 DIAGNOSIS — Z8739 Personal history of other diseases of the musculoskeletal system and connective tissue: Secondary | ICD-10-CM | POA: Insufficient documentation

## 2016-05-13 DIAGNOSIS — Z0000 Encounter for general adult medical examination without abnormal findings: Secondary | ICD-10-CM

## 2016-05-13 DIAGNOSIS — E669 Obesity, unspecified: Secondary | ICD-10-CM

## 2016-05-13 DIAGNOSIS — L700 Acne vulgaris: Secondary | ICD-10-CM | POA: Diagnosis not present

## 2016-05-13 MED ORDER — LORCASERIN HCL ER 20 MG PO TB24: 1.0000 | ORAL_TABLET | Freq: Every day | ORAL | 0 refills | Status: DC

## 2016-05-13 NOTE — Assessment & Plan Note (Signed)
Annual physical as above.  Ordering routine blood work.

## 2016-05-13 NOTE — Progress Notes (Signed)
  Subjective:    CC: Annual physical exam  HPI:  This is a pleasant 30 year old male, he is one of our [employer]. He is here for a physical.  History of Kawasaki disease:  With mitral valve prolapse, no chest pain. He does have some family history of early cardiac death. He is able to run without any chest pain or symptoms or shortness of breath, no orthopnea or paroxysmal nocturnal dyspnea, no leg swelling. Has not had an echocardiogram since childhood and gets occasional palpitations.  Obesity: Desires to start a different weight loss medication, he had fantastic weight loss with phentermine.  Acne vulgaris: Has tried multiple topicals including BenzaClin, tretinoin, doxycycline orally, no response, agreeable to consider Accutane with dermatology.  Attention deficit disorder: Well controlled.  Past medical history, Surgical history, Family history not pertinant except as noted below, Social history, Allergies, and medications have been entered into the medical record, reviewed, and no changes needed.   Review of Systems: No headache, visual changes, nausea, vomiting, diarrhea, constipation, dizziness, abdominal pain, skin rash, fevers, chills, night sweats, swollen lymph nodes, weight loss, chest pain, body aches, joint swelling, muscle aches, shortness of breath, mood changes, visual or auditory hallucinations.  Objective:    General: Well Developed, well nourished, and in no acute distress.  Neuro: Alert and oriented x3, extra-ocular muscles intact, sensation grossly intact. Cranial nerves II through XII are intact, motor, sensory, and coordinative functions are all intact. HEENT: Normocephalic, atraumatic, pupils equal round reactive to light, neck supple, no masses, no lymphadenopathy, thyroid nonpalpable. Oropharynx, nasopharynx, external ear canals are unremarkable. Skin: Warm and dry, no rashes noted.  Cardiac: Regular rate and rhythm, no murmurs rubs or gallops.    Respiratory: Clear to auscultation bilaterally. Not using accessory muscles, speaking in full sentences.  Abdominal: Soft, nontender, nondistended, positive bowel sounds, no masses, no organomegaly.  Musculoskeletal: Shoulder, elbow, wrist, hip, knee, ankle stable, and with full range of motion.  Impression and Recommendations:    The patient was counselled, risk factors were discussed, anticipatory guidance given.  Annual physical exam Annual physical as above.  Ordering routine blood work.  Acne vulgaris Insufficient response to multiple topicals and orals. Referral to dermatology for consideration of Accutane.  Obesity Starting Belviq XR  History of Kawasaki's disease Able to run without any chest pain, gets an occasional palpitation. Repeating echocardiogram.

## 2016-05-13 NOTE — Assessment & Plan Note (Signed)
Able to run without any chest pain, gets an occasional palpitation. Repeating echocardiogram.

## 2016-05-13 NOTE — Assessment & Plan Note (Signed)
Starting Belviq XR

## 2016-05-13 NOTE — Assessment & Plan Note (Signed)
Insufficient response to multiple topicals and orals. Referral to dermatology for consideration of Accutane.

## 2016-05-16 ENCOUNTER — Telehealth: Payer: Self-pay | Admitting: *Deleted

## 2016-05-16 DIAGNOSIS — Z0000 Encounter for general adult medical examination without abnormal findings: Secondary | ICD-10-CM | POA: Diagnosis not present

## 2016-05-16 LAB — COMPREHENSIVE METABOLIC PANEL WITH GFR
ALT: 40 U/L (ref 9–46)
AST: 23 U/L (ref 10–40)
Albumin: 4.5 g/dL (ref 3.6–5.1)
BUN: 7 mg/dL (ref 7–25)
Creat: 1.15 mg/dL (ref 0.60–1.35)
Sodium: 142 mmol/L (ref 135–146)
Total Bilirubin: 0.5 mg/dL (ref 0.2–1.2)
Total Protein: 6.5 g/dL (ref 6.1–8.1)

## 2016-05-16 LAB — TSH: TSH: 0.7 mIU/L (ref 0.40–4.50)

## 2016-05-16 LAB — LIPID PANEL
Cholesterol: 166 mg/dL (ref 125–200)
HDL: 40 mg/dL (ref >=40)
LDL Cholesterol: 94 mg/dL (ref <130)
Total CHOL/HDL Ratio: 4.2 ratio (ref <=5.0)
Triglycerides: 160 mg/dL — ABNORMAL HIGH (ref <150)
VLDL: 32 mg/dL — ABNORMAL HIGH (ref <30)

## 2016-05-16 LAB — CBC
HCT: 46.5 % (ref 38.5–50.0)
Hemoglobin: 16.1 g/dL (ref 13.2–17.1)
MCH: 30.3 pg (ref 27.0–33.0)
MCHC: 34.6 g/dL (ref 32.0–36.0)
MCV: 87.6 fL (ref 80.0–100.0)
MPV: 10.6 fL (ref 7.5–12.5)
Platelets: 273 K/uL (ref 140–400)
RBC: 5.31 MIL/uL (ref 4.20–5.80)
RDW: 12.9 % (ref 11.0–15.0)
WBC: 7.5 K/uL (ref 3.8–10.8)

## 2016-05-16 LAB — COMPREHENSIVE METABOLIC PANEL
Alkaline Phosphatase: 87 U/L (ref 40–115)
CO2: 27 mmol/L (ref 20–31)
Calcium: 9.3 mg/dL (ref 8.6–10.3)
Chloride: 106 mmol/L (ref 98–110)
Glucose, Bld: 90 mg/dL (ref 65–99)
Potassium: 4.5 mmol/L (ref 3.5–5.3)

## 2016-05-16 MED FILL — BELVIQ XR 20 MG TABLET: 20 | 30 days supply | Qty: 30 | Fill #0

## 2016-05-16 NOTE — Telephone Encounter (Signed)
Initiated PA through [employer]: [id number] - PA Case ID: [id number]

## 2016-05-17 DIAGNOSIS — E781 Pure hyperglyceridemia: Secondary | ICD-10-CM | POA: Insufficient documentation

## 2016-05-17 LAB — HEMOGLOBIN A1C
Hgb A1c MFr Bld: 5 % (ref <5.7)
Mean Plasma Glucose: 97 mg/dL

## 2016-05-17 LAB — VITAMIN D 25 HYDROXY (VIT D DEFICIENCY, FRACTURES): Vit D, 25-Hydroxy: 13 ng/mL — ABNORMAL LOW (ref 30–100)

## 2016-05-17 MED ORDER — NIACIN ER 500 MG PO CPCR: 500.0000 mg | ORAL_CAPSULE | Freq: Every day | ORAL | 3 refills | Status: DC

## 2016-05-17 MED ORDER — VITAMIN D (ERGOCALCIFEROL) 1.25 MG (50000 UNIT) PO CAPS: 50000.0000 [IU] | ORAL_CAPSULE | ORAL | 0 refills | Status: DC

## 2016-05-17 MED FILL — NIACIN TR 500 MG CAPSULE: 500 | 100 days supply | Qty: 100 | Fill #0

## 2016-05-17 MED FILL — VIT D2 1.25 MG (50,000 UNIT: 1.25 MG | 56 days supply | Qty: 8 | Fill #0

## 2016-05-17 NOTE — Assessment & Plan Note (Signed)
Adding niacin 500 at bedtime. Recheck in 3 months.

## 2016-05-17 NOTE — Addendum Note (Signed)
Addended by: Silverio Decamp on: 05/17/2016 08:59 AM   Modules accepted: Orders

## 2016-05-18 NOTE — Telephone Encounter (Signed)
Pharm notified.

## 2016-05-18 NOTE — Telephone Encounter (Signed)
Approvedtoday  PA Case [id number] is Approved.  Discreet message left on vm

## 2016-05-25 ENCOUNTER — Ambulatory Visit (HOSPITAL_BASED_OUTPATIENT_CLINIC_OR_DEPARTMENT_OTHER)
Admission: RE | Admit: 2016-05-25 | Discharge: 2016-05-25 | Disposition: A | Discharge status: Home / Self Care | Payer: 59 | Source: Ambulatory Visit | Attending: Sports Medicine | Admitting: Sports Medicine

## 2016-05-25 DIAGNOSIS — Z8679 Personal history of other diseases of the circulatory system: Secondary | ICD-10-CM | POA: Insufficient documentation

## 2016-05-25 DIAGNOSIS — I059 Rheumatic mitral valve disease, unspecified: Secondary | ICD-10-CM | POA: Diagnosis present

## 2016-05-25 DIAGNOSIS — Z8739 Personal history of other diseases of the musculoskeletal system and connective tissue: Secondary | ICD-10-CM

## 2016-05-25 LAB — ECHOCARDIOGRAM COMPLETE
E decel time: 201 ms
E/e' ratio: 9.59
FS: 27 % — AB (ref 28–44)
IVS/LV PW RATIO, ED: 0.81
LA ID, A-P, ES: 32 mm
LA diam end sys: 32 mm
LA diam index: 1.44 cm/m2
LA vol A4C: 46.8 mL
LA vol index: 21.8 mL/m2
LA vol: 48.3 mL
LV E/e' medial: 9.59
LV E/e'average: 9.59
LV PW d: 9.68 mm — AB (ref 0.6–1.1)
LV e' LATERAL: 12.3 cm/s
LVOT area: 3.8 cm2
LVOT diameter: 22 mm
Lateral S' vel: 15 cm/s
MV Dec: 201
MV Peak grad: 6 mmHg
MV pk A vel: 72.3 m/s
MV pk E vel: 118 m/s
TAPSE: 24 mm
TDI e' lateral: 12.3
TDI e' medial: 10

## 2016-05-25 NOTE — Progress Notes (Signed)
  Echocardiogram 2D Echocardiogram has been performed.  [name] [name] 05/25/2016, 2:47 PM

## 2016-06-10 ENCOUNTER — Ambulatory Visit (INDEPENDENT_AMBULATORY_CARE_PROVIDER_SITE_OTHER): Payer: 59 | Admitting: Sports Medicine

## 2016-06-10 DIAGNOSIS — E669 Obesity, unspecified: Secondary | ICD-10-CM | POA: Diagnosis not present

## 2016-06-10 MED ORDER — LORCASERIN HCL ER 20 MG PO TB24: 1.0000 | ORAL_TABLET | Freq: Every day | ORAL | 5 refills | Status: DC

## 2016-06-10 MED ORDER — PHENTERMINE HCL 37.5 MG PO TABS: ORAL_TABLET | ORAL | 0 refills | Status: DC

## 2016-06-10 MED FILL — BELVIQ XR 20 MG TABLET: 20 | 30 days supply | Qty: 30 | Fill #0

## 2016-06-10 MED FILL — PHENTERMINE 37.5 MG CAPSULE: 37.5 | 90 days supply | Qty: 90 | Fill #0

## 2016-06-10 NOTE — Progress Notes (Signed)
  Subjective:    CC: Weight check  HPI: [name] [name] returns, he really hasn't lost any weight on the past month on Belviq. He would like to continue phentermine for another 3 months and agrees to down taper afterwards. He also agrees to continue Belviq.  Past medical history, Surgical history, Family history not pertinant except as noted below, Social history, Allergies, and medications have been entered into the medical record, reviewed, and no changes needed.   Review of Systems: No fevers, chills, night sweats, weight loss, chest pain, or shortness of breath.   Objective:    General: Well Developed, well nourished, and in no acute distress.  Neuro: Alert and oriented x3, extra-ocular muscles intact, sensation grossly intact.  HEENT: Normocephalic, atraumatic, pupils equal round reactive to light, neck supple, no masses, no lymphadenopathy, thyroid nonpalpable.  Skin: Warm and dry, no rashes. Cardiac: Regular rate and rhythm, no murmurs rubs or gallops, no lower extremity edema.  Respiratory: Clear to auscultation bilaterally. Not using accessory muscles, speaking in full sentences.  Impression and Recommendations:    Obesity We've done a good job with maintenance of weight loss on Belviq however he hasn't really lost any more. Continue Belviq, I am going to do a single additional 3 month supply of full dose phentermine before we taper him down to 15 mg daily for maintenance. Return in 3 months for a weight check.  I spent 25 minutes with this patient, greater than 50% was face-to-face time counseling regarding the above diagnoses

## 2016-06-10 NOTE — Assessment & Plan Note (Signed)
We've done a good job with maintenance of weight loss on Belviq however he hasn't really lost any more. Continue Belviq, I am going to do a single additional 3 month supply of full dose phentermine before we taper him down to 15 mg daily for maintenance. Return in 3 months for a weight check.

## 2016-06-15 ENCOUNTER — Encounter: Payer: Self-pay | Admitting: Sports Medicine

## 2016-06-15 MED ORDER — ONDANSETRON 8 MG PO TBDP
8.0000 mg | ORAL_TABLET | Freq: Three times a day (TID) | ORAL | 11 refills | Status: DC | PRN
Start: 2016-06-15 — End: 2016-12-05

## 2016-06-15 MED FILL — FLUTICASONE PROP 50 MCG SPR: 50 | 90 days supply | Qty: 48 | Fill #1

## 2016-06-15 MED FILL — ONDANSETRON ODT 8 MG TABLET: 8 | 7 days supply | Qty: 20 | Fill #0

## 2016-07-25 MED FILL — BELVIQ XR 20 MG TABLET: 20 | 30 days supply | Qty: 30 | Fill #1

## 2016-08-10 DIAGNOSIS — L700 Acne vulgaris: Secondary | ICD-10-CM | POA: Diagnosis not present

## 2016-08-10 MED FILL — SULFAMETHOXAZOLE/TMP DS TAB: 800-160 | 30 days supply | Qty: 60 | Fill #0

## 2016-09-06 ENCOUNTER — Encounter: Payer: Self-pay | Admitting: Sports Medicine

## 2016-09-06 ENCOUNTER — Telehealth: Payer: Self-pay | Admitting: *Deleted

## 2016-09-06 ENCOUNTER — Ambulatory Visit (INDEPENDENT_AMBULATORY_CARE_PROVIDER_SITE_OTHER): Payer: 59 | Admitting: Sports Medicine

## 2016-09-06 DIAGNOSIS — E669 Obesity, unspecified: Secondary | ICD-10-CM | POA: Diagnosis not present

## 2016-09-06 MED ORDER — LORCASERIN HCL ER 20 MG PO TB24: 1.0000 | ORAL_TABLET | Freq: Every day | ORAL | 5 refills | Status: DC

## 2016-09-06 MED ORDER — PHENTERMINE HCL 37.5 MG PO TABS: ORAL_TABLET | ORAL | 0 refills | Status: DC

## 2016-09-06 MED FILL — BELVIQ XR 20 MG TABLET: 20 | 30 days supply | Qty: 30 | Fill #2

## 2016-09-06 NOTE — Telephone Encounter (Signed)
PA submitted through covermymeds for Belviq [id number] - PA Case ID: [id number]

## 2016-09-06 NOTE — Telephone Encounter (Signed)
PA was initially denied. Spoke with rep who states sometimes when completing a reath from [place] form it doesn't give you all the questions needed to complete reauth. Resubmitted over the phone and it was approved [id number]. Let Lacretia Nicks know since the patient called her when this was denied.

## 2016-09-06 NOTE — Progress Notes (Signed)
  Subjective:    CC: Follow-up  HPI: [name] [name] returns, he's lost 30 pounds in the past 3 months. He is taking Belviq. Agrees to drop down his phentermine.  Past medical history:  Negative.  See flowsheet/record as well for more information.  Surgical history: Negative.  See flowsheet/record as well for more information.  Family history: Negative.  See flowsheet/record as well for more information.  Social history: Negative.  See flowsheet/record as well for more information.  Allergies, and medications have been entered into the medical record, reviewed, and no changes needed.   Review of Systems: No fevers, chills, night sweats, weight loss, chest pain, or shortness of breath.   Objective:    General: Well Developed, well nourished, and in no acute distress.  Neuro: Alert and oriented x3, extra-ocular muscles intact, sensation grossly intact.  HEENT: Normocephalic, atraumatic, pupils equal round reactive to light, neck supple, no masses, no lymphadenopathy, thyroid nonpalpable.  Skin: Warm and dry, no rashes. Cardiac: Regular rate and rhythm, no murmurs rubs or gallops, no lower extremity edema.  Respiratory: Clear to auscultation bilaterally. Not using accessory muscles, speaking in full sentences.  Impression and Recommendations:    Obesity Fantastic 30 pound weight loss in the past 3 months. Continue Belviq. I'm going to give him a single additional supply of 3 months of half dose phentermine and then we will discontinue.

## 2016-09-06 NOTE — Assessment & Plan Note (Signed)
Fantastic 30 pound weight loss in the past 3 months. Continue Belviq. I'm going to give him a single additional supply of 3 months of half dose phentermine and then we will discontinue.

## 2016-09-09 ENCOUNTER — Ambulatory Visit (INDEPENDENT_AMBULATORY_CARE_PROVIDER_SITE_OTHER): Payer: 59 | Admitting: Sports Medicine

## 2016-09-09 DIAGNOSIS — K121 Other forms of stomatitis: Secondary | ICD-10-CM

## 2016-09-09 MED ORDER — MAGIC MOUTHWASH W/LIDOCAINE: 15.0000 mL | Freq: Four times a day (QID) | ORAL | 11 refills | Status: DC | PRN

## 2016-09-09 MED ORDER — PREDNISONE 50 MG PO TABS: 50.0000 mg | ORAL_TABLET | Freq: Every day | ORAL | 0 refills | Status: DC

## 2016-09-09 MED FILL — MMW MILES NO TET: 9 days supply | Qty: 500 | Fill #0

## 2016-09-09 MED FILL — predniSONE 50 MG TABS: 50 | 5 days supply | Qty: 5 | Fill #0

## 2016-09-09 NOTE — Progress Notes (Signed)
  Subjective:    CC: Lesions on throat  HPI: For the past several days this pleasant 30 year old male has had painful throat ulcers, localized on the soft palate and the tonsils, no constitutional symptoms, no nausea, vomiting, no cough, no skin rash. Other than the rash in his throat he denies any other mucocutaneous involvement.   Past medical history:  Negative.  See flowsheet/record as well for more information.  Surgical history: Negative.  See flowsheet/record as well for more information.  Family history: Negative.  See flowsheet/record as well for more information.  Social history: Negative.  See flowsheet/record as well for more information.  Allergies, and medications have been entered into the medical record, reviewed, and no changes needed.   Review of Systems: No fevers, chills, night sweats, weight loss, chest pain, or shortness of breath.   Objective:    General: Well Developed, well nourished, and in no acute distress.  Neuro: Alert and oriented x3, extra-ocular muscles intact, sensation grossly intact.  HEENT: Normocephalic, atraumatic, pupils equal round reactive to light, neck supple, no masses, no lymphadenopathy, thyroid nonpalpable. Nasopharynx, ear canals are markable, flat, 1 cm ulcers on the soft palate and tonsils consistent with herpangina.  Skin: Warm and dry, no rashes. Cardiac: Regular rate and rhythm, no murmurs rubs or gallops, no lower extremity edema.  Respiratory: Clear to auscultation bilaterally. Not using accessory muscles, speaking in full sentences.  Impression and Recommendations:    Ulcer of soft palate Suspect herpangina. He did just finished a course of Septra. Adding prednisone, Magic mouthwash. Because Septra can present with mucocutaneous involvement that is mild, or can progress to something as severe as Stevens-Johnson syndrome or toxic epidermal necrolysis we will keep close follow-up through the weekend, he does have my phone  number.  I spent 25 minutes with this patient, greater than 50% was face-to-face time counseling regarding the above diagnoses

## 2016-09-09 NOTE — Assessment & Plan Note (Signed)
Suspect herpangina. He did just finished a course of Septra. Adding prednisone, Magic mouthwash. Because Septra can present with mucocutaneous involvement that is mild, or can progress to something as severe as Stevens-Johnson syndrome or toxic epidermal necrolysis we will keep close follow-up through the weekend, he does have my phone number.

## 2016-09-12 MED FILL — SULFAMETHOXAZOLE/TMP DS TAB: 800-160 | 30 days supply | Qty: 60 | Fill #1

## 2016-09-12 MED FILL — PHENTERMINE 37.5 MG TABLET: 37.5 | 90 days supply | Qty: 45 | Fill #0

## 2016-10-10 MED FILL — BELVIQ XR 20 MG TABLET: 20 | 30 days supply | Qty: 30 | Fill #3

## 2016-10-19 DIAGNOSIS — L700 Acne vulgaris: Secondary | ICD-10-CM | POA: Diagnosis not present

## 2016-10-21 MED FILL — SULFAMETHOXAZOLE/TMP DS TAB: 800-160 | 30 days supply | Qty: 60 | Fill #0

## 2016-11-14 MED FILL — BELVIQ XR 20 MG TABLET: 20 | 30 days supply | Qty: 30 | Fill #4

## 2016-11-15 ENCOUNTER — Encounter: Payer: Self-pay | Admitting: Sports Medicine

## 2016-11-18 MED FILL — NIACIN TR 500 MG CAPSULE: 500 | 100 days supply | Qty: 100 | Fill #1

## 2016-12-05 ENCOUNTER — Ambulatory Visit (INDEPENDENT_AMBULATORY_CARE_PROVIDER_SITE_OTHER): Payer: 59 | Admitting: Sports Medicine

## 2016-12-05 ENCOUNTER — Encounter: Payer: Self-pay | Admitting: Sports Medicine

## 2016-12-05 DIAGNOSIS — K121 Other forms of stomatitis: Secondary | ICD-10-CM | POA: Diagnosis not present

## 2016-12-05 DIAGNOSIS — E669 Obesity, unspecified: Secondary | ICD-10-CM | POA: Diagnosis not present

## 2016-12-05 DIAGNOSIS — I7300 Raynaud's syndrome without gangrene: Secondary | ICD-10-CM | POA: Insufficient documentation

## 2016-12-05 MED ORDER — AMLODIPINE BESYLATE 2.5 MG PO TABS: 2.5000 mg | ORAL_TABLET | Freq: Every day | ORAL | 3 refills | Status: DC

## 2016-12-05 MED ORDER — LORCASERIN HCL ER 20 MG PO TB24: 1.0000 | ORAL_TABLET | Freq: Every day | ORAL | 3 refills | Status: DC

## 2016-12-05 MED FILL — AMLODIPINE BESYLATE 2.5 MG: 2.5 | 90 days supply | Qty: 90 | Fill #0

## 2016-12-05 NOTE — Progress Notes (Signed)
  Subjective:    CC:  Follow-up  HPI: Obesity: Good continued weight loss on Belviq.  Adult ADHD: Stable on current dose of Concerta.  Raynaud phenomenon: During cold weather gets pain, numbness, tingling, and color change in his rigth great toe and fingertips.  Oral lesions: Resolved, suspected herpangina.  Past medical history:  Negative.  See flowsheet/record as well for more information.  Surgical history: Negative.  See flowsheet/record as well for more information.  Family history: Negative.  See flowsheet/record as well for more information.  Social history: Negative.  See flowsheet/record as well for more information.  Allergies, and medications have been entered into the medical record, reviewed, and no changes needed.   Review of Systems: No fevers, chills, night sweats, weight loss, chest pain, or shortness of breath.   Objective:    General: Well Developed, well nourished, and in no acute distress.  Neuro: Alert and oriented x3, extra-ocular muscles intact, sensation grossly intact.  HEENT: Normocephalic, atraumatic, pupils equal round reactive to light, neck supple, no masses, no lymphadenopathy, thyroid nonpalpable.  Skin: Warm and dry, no rashes. Cardiac: Regular rate and rhythm, no murmurs rubs or gallops, no lower extremity edema.  Respiratory: Clear to auscultation bilaterally. Not using accessory muscles, speaking in full sentences.  Impression and Recommendations:    Raynaud phenomenon Adding low-dose amlodipine, we can likely discontinue this when the weather warms up. Other options in the future include decreasing Concerta dose.  Ulcer of soft palate Prednisone and Magic mouthwash resolved symptoms.  Obesity Fantastic continued weight loss, additional 10 pounds since last weight check 3 months ago. Refilling Belviq, 3 months with a refill given.

## 2016-12-05 NOTE — Assessment & Plan Note (Signed)
Adding low-dose amlodipine, we can likely discontinue this when the weather warms up. Other options in the future include decreasing Concerta dose.

## 2016-12-05 NOTE — Assessment & Plan Note (Signed)
Prednisone and Magic mouthwash resolved symptoms.

## 2016-12-05 NOTE — Assessment & Plan Note (Signed)
Fantastic continued weight loss, additional 10 pounds since last weight check 3 months ago. Refilling Belviq, 3 months with a refill given.

## 2016-12-06 ENCOUNTER — Ambulatory Visit: Payer: 59 | Admitting: Sports Medicine

## 2016-12-12 MED FILL — SULFAMETHOXAZOLE/TMP DS TAB: 800-160 | 30 days supply | Qty: 60 | Fill #1

## 2016-12-19 MED FILL — BELVIQ XR 20 MG TABLET: 20 | 30 days supply | Qty: 30 | Fill #0

## 2016-12-21 DIAGNOSIS — L700 Acne vulgaris: Secondary | ICD-10-CM | POA: Diagnosis not present

## 2017-01-10 MED FILL — SULFAMETHOXAZOLE/TMP DS TAB: 800-160 | 30 days supply | Qty: 60 | Fill #0

## 2017-01-24 ENCOUNTER — Telehealth: Payer: Self-pay | Admitting: Sports Medicine

## 2017-01-24 NOTE — Telephone Encounter (Signed)
Received PA for Belviq sent through cover my meds waiting on authorization. - CF

## 2017-01-27 NOTE — Telephone Encounter (Signed)
Received fax from [facility] needing more information I answered questions and faxed back waiting on determination. - CF

## 2017-01-31 MED FILL — BELVIQ XR 20 MG TABLET: 20 | 90 days supply | Qty: 90 | Fill #0

## 2017-01-31 NOTE — Telephone Encounter (Signed)
Received fax from [facility] and they approved Belviq for 12 fills from 01/27/17 -01/26/18 as long as patient stays enrolled in plan.  - CF

## 2017-02-14 MED FILL — SULFAMETHOXAZOLE/TMP DS TAB: 800-160 | 30 days supply | Qty: 60 | Fill #1

## 2017-03-01 MED FILL — AMLODIPINE 2.5 MG TABLET: 2.5 | 30 days supply | Qty: 30 | Fill #1

## 2017-03-01 MED FILL — NIACIN TR 500 MG CAPSULE: 500 | 100 days supply | Qty: 100 | Fill #2

## 2017-03-15 DIAGNOSIS — L700 Acne vulgaris: Secondary | ICD-10-CM | POA: Diagnosis not present

## 2017-03-20 ENCOUNTER — Encounter: Payer: Self-pay | Admitting: Sports Medicine

## 2017-03-20 DIAGNOSIS — G8929 Other chronic pain: Secondary | ICD-10-CM

## 2017-03-20 DIAGNOSIS — M545 Low back pain: Principal | ICD-10-CM

## 2017-03-20 MED ORDER — MELOXICAM 15 MG PO TABS: 15.0000 mg | ORAL_TABLET | Freq: Every day | ORAL | 3 refills | Status: DC

## 2017-03-20 MED FILL — MELOXICAM 15 MG TABLET: 15 | 90 days supply | Qty: 90 | Fill #0

## 2017-03-20 MED FILL — SULFAMETHOXAZOLE/TMP DS TAB: 800-160 | 30 days supply | Qty: 60 | Fill #0

## 2017-04-13 ENCOUNTER — Other Ambulatory Visit: Payer: Self-pay | Admitting: Sports Medicine

## 2017-04-13 DIAGNOSIS — I7300 Raynaud's syndrome without gangrene: Secondary | ICD-10-CM

## 2017-04-13 MED FILL — AMLODIPINE 2.5 MG TABLET: 2.5 | 90 days supply | Qty: 90 | Fill #0

## 2017-04-25 MED FILL — SULFAMETHOXAZOLE/TMP DS TAB: 800-160 | 30 days supply | Qty: 60 | Fill #1

## 2017-05-10 MED FILL — BELVIQ XR 20 MG TABLET: 20 | 90 days supply | Qty: 90 | Fill #1

## 2017-06-14 DIAGNOSIS — L700 Acne vulgaris: Secondary | ICD-10-CM | POA: Diagnosis not present

## 2017-06-16 DIAGNOSIS — Z79899 Other long term (current) drug therapy: Secondary | ICD-10-CM | POA: Diagnosis not present

## 2017-06-23 ENCOUNTER — Encounter: Payer: Self-pay | Admitting: Sports Medicine

## 2017-06-23 ENCOUNTER — Ambulatory Visit (INDEPENDENT_AMBULATORY_CARE_PROVIDER_SITE_OTHER): Payer: 59 | Admitting: Sports Medicine

## 2017-06-23 DIAGNOSIS — L700 Acne vulgaris: Secondary | ICD-10-CM

## 2017-06-23 DIAGNOSIS — Z0000 Encounter for general adult medical examination without abnormal findings: Secondary | ICD-10-CM

## 2017-06-23 DIAGNOSIS — Z23 Encounter for immunization: Secondary | ICD-10-CM | POA: Diagnosis not present

## 2017-06-23 DIAGNOSIS — Q788 Other specified osteochondrodysplasias: Secondary | ICD-10-CM

## 2017-06-23 DIAGNOSIS — Z8042 Family history of malignant neoplasm of prostate: Secondary | ICD-10-CM

## 2017-06-23 DIAGNOSIS — E6609 Other obesity due to excess calories: Secondary | ICD-10-CM | POA: Diagnosis not present

## 2017-06-23 DIAGNOSIS — F909 Attention-deficit hyperactivity disorder, unspecified type: Secondary | ICD-10-CM | POA: Diagnosis not present

## 2017-06-23 DIAGNOSIS — E781 Pure hyperglyceridemia: Secondary | ICD-10-CM | POA: Diagnosis not present

## 2017-06-23 MED FILL — MYORISAN 40 MG CAPSULE: 40 | 30 days supply | Qty: 30 | Fill #0

## 2017-06-23 NOTE — Assessment & Plan Note (Signed)
Per patient request starting PSA screening.  We discussed the science behind it and the limitations of screening.

## 2017-06-23 NOTE — Progress Notes (Signed)
  Subjective:    CC: annual physical  HPI:  [name] [name] is a healthy and pleasant 31 year old male, he is one of our executives. He is overall doing well. He does have a history of adult ADHD, treated well with Concerta but he has not taken this medication in some time. He has restarted it and will call me when he needs refills.  He also agrees to work harder on weight loss. He has a strong family history of prostate cancer and desires that we start checking his PSA.  Started on Accutane for acne vulgaris.  Weight loss will help his hyperglyceridemia  Past medical history:  Negative.  See flowsheet/record as well for more information.  Surgical history: Negative.  See flowsheet/record as well for more information.  Family history: Negative.  See flowsheet/record as well for more information.  Social history: Negative.  See flowsheet/record as well for more information.  Allergies, and medications have been entered into the medical record, reviewed, and no changes needed.    Review of Systems: No headache, visual changes, nausea, vomiting, diarrhea, constipation, dizziness, abdominal pain, skin rash, fevers, chills, night sweats, swollen lymph nodes, weight loss, chest pain, body aches, joint swelling, muscle aches, shortness of breath, mood changes, visual or auditory hallucinations.  Objective:    General: Well Developed, well nourished, and in no acute distress.  Neuro: Alert and oriented x3, extra-ocular muscles intact, sensation grossly intact. Cranial nerves II through XII are intact, motor, sensory, and coordinative functions are all intact. HEENT: Normocephalic, atraumatic, pupils equal round reactive to light, neck supple, no masses, no lymphadenopathy, thyroid nonpalpable. Oropharynx, nasopharynx, external ear canals are unremarkable. Skin: Warm and dry, no rashes noted. Few nonspecific nevi on the back. Cardiac: Regular rate and rhythm, no murmurs rubs or gallops.  Respiratory: Clear  to auscultation bilaterally. Not using accessory muscles, speaking in full sentences.  Abdominal: Soft, nontender, nondistended, positive bowel sounds, no masses, no organomegaly.  Musculoskeletal: Shoulder, elbow, wrist, hip, knee, ankle stable, and with full range of motion.  Impression and Recommendations:    The patient was counselled, risk factors were discussed, anticipatory guidance given.  Annual physical exam Annual physical as above, flu shot given.  Hypertriglyceridemia Rechecking lipid panel.  Obesity Restarting Concerta will help, discontinue Belviq, it's been ineffective.  Adult ADHD Has really not been using Concerta, he will call me for refills.  Acne vulgaris Started Accutane.  Family history of prostate cancer Per patient request starting PSA screening.  We discussed the science behind it and the limitations of screening.  ___________________________________________ Gwen Her. Dianah Field, M.D., ABFM., CAQSM. Primary Care and [place] Instructor of [place] of [facility] of Medicine

## 2017-06-23 NOTE — Assessment & Plan Note (Signed)
Started Accutane.

## 2017-06-23 NOTE — Assessment & Plan Note (Signed)
Restarting Concerta will help, discontinue Belviq, it's been ineffective.

## 2017-06-23 NOTE — Assessment & Plan Note (Signed)
Annual physical as above, flu shot given.

## 2017-06-23 NOTE — Assessment & Plan Note (Signed)
Rechecking lipid panel 

## 2017-06-23 NOTE — Assessment & Plan Note (Signed)
Has really not been using Concerta, he will call me for refills.

## 2017-06-28 DIAGNOSIS — Z0000 Encounter for general adult medical examination without abnormal findings: Secondary | ICD-10-CM | POA: Diagnosis not present

## 2017-06-28 DIAGNOSIS — Z8042 Family history of malignant neoplasm of prostate: Secondary | ICD-10-CM | POA: Diagnosis not present

## 2017-06-28 DIAGNOSIS — Q788 Other specified osteochondrodysplasias: Secondary | ICD-10-CM | POA: Diagnosis not present

## 2017-06-28 DIAGNOSIS — E781 Pure hyperglyceridemia: Secondary | ICD-10-CM | POA: Diagnosis not present

## 2017-06-29 LAB — LIPID PANEL W/REFLEX DIRECT LDL
Cholesterol: 196 mg/dL (ref <200)
HDL: 47 mg/dL (ref >40)
LDL Cholesterol (Calc): 124 mg/dL (calc) — ABNORMAL HIGH
Non-HDL Cholesterol (Calc): 149 mg/dL (calc) — ABNORMAL HIGH (ref <130)
Total CHOL/HDL Ratio: 4.2 (calc) (ref <5.0)
Triglycerides: 132 mg/dL (ref <150)

## 2017-06-29 LAB — COMPREHENSIVE METABOLIC PANEL
AG Ratio: 2.6 (calc) — ABNORMAL HIGH (ref 1.0–2.5)
AST: 21 U/L (ref 10–40)
Albumin: 4.7 g/dL (ref 3.6–5.1)
CO2: 31 mmol/L (ref 20–32)
Chloride: 107 mmol/L (ref 98–110)
Creat: 0.97 mg/dL (ref 0.60–1.35)
Globulin: 1.8 g/dL (calc) — ABNORMAL LOW (ref 1.9–3.7)
Glucose, Bld: 91 mg/dL (ref 65–99)
Potassium: 4.5 mmol/L (ref 3.5–5.3)
Sodium: 142 mmol/L (ref 135–146)
Total Protein: 6.5 g/dL (ref 6.1–8.1)

## 2017-06-29 LAB — COMPREHENSIVE METABOLIC PANEL WITH GFR
ALT: 31 U/L (ref 9–46)
Alkaline phosphatase (APISO): 89 U/L (ref 40–115)
BUN: 10 mg/dL (ref 7–25)
Calcium: 9.1 mg/dL (ref 8.6–10.3)
Total Bilirubin: 0.5 mg/dL (ref 0.2–1.2)

## 2017-06-29 LAB — HEMOGLOBIN A1C
Hgb A1c MFr Bld: 4.9 %{Hb} (ref <5.7)
Mean Plasma Glucose: 94 (calc)
eAG (mmol/L): 5.2 (calc)

## 2017-06-29 LAB — PSA, TOTAL AND FREE
PSA, % Free: 20 % — ABNORMAL LOW (ref >25)
PSA, Free: 0.1 ng/mL
PSA, Total: 0.5 ng/mL (ref <=4.0)

## 2017-06-29 LAB — CBC
HCT: 43.8 % (ref 38.5–50.0)
Hemoglobin: 15.1 g/dL (ref 13.2–17.1)
MCH: 30.3 pg (ref 27.0–33.0)
MCHC: 34.5 g/dL (ref 32.0–36.0)
MCV: 87.8 fL (ref 80.0–100.0)
MPV: 10.4 fL (ref 7.5–12.5)
Platelets: 288 10*3/uL (ref 140–400)
RBC: 4.99 10*6/uL (ref 4.20–5.80)
RDW: 12 % (ref 11.0–15.0)
WBC: 6.8 10*3/uL (ref 3.8–10.8)

## 2017-06-29 LAB — VITAMIN D 25 HYDROXY (VIT D DEFICIENCY, FRACTURES): Vit D, 25-Hydroxy: 14 ng/mL — ABNORMAL LOW (ref 30–100)

## 2017-06-29 LAB — HIV ANTIBODY (ROUTINE TESTING W REFLEX): HIV 1&2 Ab, 4th Generation: NONREACTIVE

## 2017-06-29 MED ORDER — VITAMIN D (ERGOCALCIFEROL) 1.25 MG (50000 UNIT) PO CAPS: 50000.0000 [IU] | ORAL_CAPSULE | ORAL | 0 refills | Status: DC

## 2017-06-29 MED FILL — VIT D2 1.25 MG (50,000 UNIT: 1.25 MG | 56 days supply | Qty: 8 | Fill #0

## 2017-06-29 NOTE — Addendum Note (Signed)
Addended by: Silverio Decamp on: 06/29/2017 10:25 AM   Modules accepted: Orders

## 2017-07-05 MED FILL — MELOXICAM 15 MG TABLET: 15 | 90 days supply | Qty: 90 | Fill #1

## 2017-07-19 DIAGNOSIS — L700 Acne vulgaris: Secondary | ICD-10-CM | POA: Diagnosis not present

## 2017-07-21 MED FILL — MYORISAN 40 MG CAPSULE: 40 | 30 days supply | Qty: 60 | Fill #0

## 2017-07-24 MED FILL — AMLODIPINE 2.5 MG TABLET: 2.5 | 30 days supply | Qty: 30 | Fill #1

## 2017-08-23 ENCOUNTER — Encounter: Payer: Self-pay | Admitting: Sports Medicine

## 2017-08-23 DIAGNOSIS — L700 Acne vulgaris: Secondary | ICD-10-CM | POA: Diagnosis not present

## 2017-08-25 DIAGNOSIS — L700 Acne vulgaris: Secondary | ICD-10-CM | POA: Diagnosis not present

## 2017-08-25 DIAGNOSIS — Z79899 Other long term (current) drug therapy: Secondary | ICD-10-CM | POA: Diagnosis not present

## 2017-08-25 MED FILL — MYORISAN 40 MG CAPSULE: 40 | 30 days supply | Qty: 60 | Fill #0

## 2017-08-28 ENCOUNTER — Other Ambulatory Visit: Payer: Self-pay | Admitting: Sports Medicine

## 2017-08-28 DIAGNOSIS — E781 Pure hyperglyceridemia: Secondary | ICD-10-CM

## 2017-08-28 DIAGNOSIS — I7300 Raynaud's syndrome without gangrene: Secondary | ICD-10-CM

## 2017-08-28 MED FILL — AMLODIPINE 2.5 MG TABLET: 2.5 | 90 days supply | Qty: 90 | Fill #0

## 2017-08-28 MED FILL — NIACIN TR 500 MG CAPSULE: 500 | 100 days supply | Qty: 100 | Fill #0

## 2017-09-14 ENCOUNTER — Ambulatory Visit (INDEPENDENT_AMBULATORY_CARE_PROVIDER_SITE_OTHER): Payer: 59 | Admitting: Sports Medicine

## 2017-09-14 DIAGNOSIS — L03031 Cellulitis of right toe: Secondary | ICD-10-CM | POA: Diagnosis not present

## 2017-09-14 MED ORDER — DOXYCYCLINE HYCLATE 100 MG PO TABS: 100.0000 mg | ORAL_TABLET | Freq: Two times a day (BID) | ORAL | 0 refills | Status: AC

## 2017-09-14 MED ORDER — MUPIROCIN 2 % EX OINT: TOPICAL_OINTMENT | CUTANEOUS | 3 refills | Status: DC

## 2017-09-14 MED FILL — DOXYCYCLINE HYCLATE 100 MG: 100 | 7 days supply | Qty: 14 | Fill #0

## 2017-09-14 MED FILL — MUPIROCIN 2% OINTMENT: 2 | 7 days supply | Qty: 22 | Fill #0

## 2017-09-14 NOTE — Assessment & Plan Note (Signed)
Medial aspect, caution to avoid pulling out toenail and cuticle remnants. He will cut his nails straight across. Adding doxycycline, topical mupirocin, change dressing 3 times per day, warm compresses and massage, to express purulence. Return to see me if no better in a week or 2.

## 2017-09-14 NOTE — Patient Instructions (Signed)
Paronychia Paronychia is an infection of the skin that surrounds a nail. It usually affects the skin around a fingernail, but it may also occur near a toenail. It often causes pain and swelling around the nail. This condition may come on suddenly or develop over a longer period. In some cases, a collection of pus (abscess) can form near or under the nail. Usually, paronychia is not serious and it clears up with treatment. What are the causes? This condition may be caused by bacteria or fungi. It is commonly caused by either Streptococcus or Staphylococcus bacteria. The bacteria or fungi often cause the infection by getting into the affected area through an opening in the skin, such as a cut or a hangnail. What increases the risk? This condition is more likely to develop in:  People who get their hands wet often, such as those who work as Designer, industrial/product, bartenders, or nurses.  People who bite their fingernails or suck their thumbs.  People who trim their nails too short.  People who have hangnails or injured fingertips.  People who get manicures.  People who have diabetes.  What are the signs or symptoms? Symptoms of this condition include:  Redness and swelling of the skin near the nail.  Tenderness around the nail when you touch the area.  Pus-filled bumps under the cuticle. The cuticle is the skin at the base or sides of the nail.  Fluid or pus under the nail.  Throbbing pain in the area.  How is this diagnosed? This condition is usually diagnosed with a physical exam. In some cases, a sample of pus may be taken from an abscess to be tested in a lab. This can help to determine what type of bacteria or fungi is causing the condition. How is this treated? Treatment for this condition depends on the cause and severity of the condition. If the condition is mild, it may clear up on its own in a few days. Your health care provider may recommend soaking the affected area in warm water a  few times a day. When treatment is needed, the options may include:  Antibiotic medicine, if the condition is caused by a bacterial infection.  Antifungal medicine, if the condition is caused by a fungal infection.  Incision and drainage, if an abscess is present. In this procedure, the health care provider will cut open the abscess so the pus can drain out.  Follow these instructions at home:  Soak the affected area in warm water if directed to do so by your health care provider. You may be told to do this for 20 minutes, 2-3 times a day. Keep the area dry in between soakings.  Take medicines only as directed by your health care provider.  If you were prescribed an antibiotic medicine, finish all of it even if you start to feel better.  Keep the affected area clean.  Do not try to drain a fluid-filled bump yourself.  If you will be washing dishes or performing other tasks that require your hands to get wet, wear rubber gloves. You should also wear gloves if your hands might come in contact with irritating substances, such as cleaners or chemicals.  Follow your health care provider's instructions about: ? Wound care. ? Bandage (dressing) changes and removal. Contact a health care provider if:  Your symptoms get worse or do not improve with treatment.  You have a fever or chills.  You have redness spreading from the affected area.  You have continued  or increased fluid, blood, or pus coming from the affected area.  Your finger or knuckle becomes swollen or is difficult to move. This information is not intended to replace advice given to you by your health care provider. Make sure you discuss any questions you have with your health care provider. Document Released: 03/08/2001 Document Revised: 02/18/2016 Document Reviewed: 08/20/2014 Elsevier Interactive Patient Education  2018 Elsevier Inc.  

## 2017-09-14 NOTE — Progress Notes (Signed)
  Subjective:    CC: Toe infection  HPI: For the past several days Gearin had some increasing pain, swelling from his right great toe.  He was cutting his nails and noted some cuticle that was stuck along the medial aspect of the nail fold, he pulled this out and then subsequently developed pain, swelling, purulence.  Moderate, worsening, localized without radiation.  Past medical history:  Negative.  See flowsheet/record as well for more information.  Surgical history: Negative.  See flowsheet/record as well for more information.  Family history: Negative.  See flowsheet/record as well for more information.  Social history: Negative.  See flowsheet/record as well for more information.  Allergies, and medications have been entered into the medical record, reviewed, and no changes needed.   (To billers/coders, pertinent past medical, social, surgical, family history can be found in problem list, if problem list is marked as reviewed then this indicates that past medical, social, surgical, family history was also reviewed)  Review of Systems: No fevers, chills, night sweats, weight loss, chest pain, or shortness of breath.   Objective:    General: Well Developed, well nourished, and in no acute distress.  Neuro: Alert and oriented x3, extra-ocular muscles intact, sensation grossly intact.  HEENT: Normocephalic, atraumatic, pupils equal round reactive to light, neck supple, no masses, no lymphadenopathy, thyroid nonpalpable.  Skin: Warm and dry, no rashes. Cardiac: Regular rate and rhythm, no murmurs rubs or gallops, no lower extremity edema.  Respiratory: Clear to auscultation bilaterally. Not using accessory muscles, speaking in full sentences. Right great toe: Paronychia involving the medial aspect with purulence and some visible granulation tissue.  Impression and Recommendations:    Paronychia of great toe, right Medial aspect, caution to avoid pulling out toenail and cuticle  remnants. He will cut his nails straight across. Adding doxycycline, topical mupirocin, change dressing 3 times per day, warm compresses and massage, to express purulence. Return to see me if no better in a week or 2. ___________________________________________ Gwen Her. Dianah Field, M.D., ABFM., CAQSM. Primary Care and [place] Instructor of [place] of [facility] of Medicine

## 2017-09-27 DIAGNOSIS — L700 Acne vulgaris: Secondary | ICD-10-CM | POA: Diagnosis not present

## 2017-09-29 MED FILL — MYORISAN 40 MG CAPSULE: 40 | 30 days supply | Qty: 60 | Fill #0

## 2017-10-05 ENCOUNTER — Encounter: Payer: Self-pay | Admitting: Sports Medicine

## 2017-10-13 MED FILL — MELOXICAM 15 MG TABLET: 15 | 90 days supply | Qty: 90 | Fill #2

## 2017-10-31 DIAGNOSIS — Z79899 Other long term (current) drug therapy: Secondary | ICD-10-CM | POA: Diagnosis not present

## 2017-10-31 DIAGNOSIS — L700 Acne vulgaris: Secondary | ICD-10-CM | POA: Diagnosis not present

## 2017-10-31 DIAGNOSIS — L853 Xerosis cutis: Secondary | ICD-10-CM | POA: Diagnosis not present

## 2017-10-31 MED FILL — MYORISAN 40 MG CAPSULE: 40 | 30 days supply | Qty: 60 | Fill #0

## 2017-10-31 MED FILL — TRIAMCINOLONE ACETONIDE 0.1: 0.1 | 30 days supply | Qty: 80 | Fill #0

## 2017-12-01 MED FILL — AMLODIPINE 2.5 MG TABLET: 2.5 | 30 days supply | Qty: 30 | Fill #1

## 2017-12-07 MED FILL — MYORISAN 40 MG CAPSULE: 40 | 30 days supply | Qty: 90 | Fill #0

## 2017-12-22 ENCOUNTER — Ambulatory Visit (INDEPENDENT_AMBULATORY_CARE_PROVIDER_SITE_OTHER): Payer: 59 | Admitting: Sports Medicine

## 2017-12-22 ENCOUNTER — Ambulatory Visit (INDEPENDENT_AMBULATORY_CARE_PROVIDER_SITE_OTHER): Payer: 59

## 2017-12-22 ENCOUNTER — Encounter: Payer: Self-pay | Admitting: Sports Medicine

## 2017-12-22 DIAGNOSIS — M542 Cervicalgia: Secondary | ICD-10-CM | POA: Diagnosis not present

## 2017-12-22 DIAGNOSIS — M47892 Other spondylosis, cervical region: Secondary | ICD-10-CM | POA: Diagnosis not present

## 2017-12-22 DIAGNOSIS — M62838 Other muscle spasm: Secondary | ICD-10-CM | POA: Insufficient documentation

## 2017-12-22 DIAGNOSIS — E6609 Other obesity due to excess calories: Secondary | ICD-10-CM | POA: Diagnosis not present

## 2017-12-22 DIAGNOSIS — M50122 Cervical disc disorder at C5-C6 level with radiculopathy: Secondary | ICD-10-CM | POA: Diagnosis not present

## 2017-12-22 MED ORDER — MELOXICAM 15 MG PO TABS: ORAL_TABLET | ORAL | 3 refills | Status: DC

## 2017-12-22 MED ORDER — PHENTERMINE HCL 37.5 MG PO TABS
18.7500 mg | ORAL_TABLET | Freq: Every day | ORAL | 0 refills | Status: DC
Start: 2017-12-22 — End: 2018-04-05

## 2017-12-22 MED ORDER — CYCLOBENZAPRINE HCL 10 MG PO TABS: ORAL_TABLET | ORAL | 0 refills | Status: DC

## 2017-12-22 MED FILL — CYCLOBENZAPRINE HCL 10 MG T: 10 | 10 days supply | Qty: 30 | Fill #0

## 2017-12-22 MED FILL — PHENTERMINE 37.5 MG TABLET: 37.5 | 90 days supply | Qty: 45 | Fill #0

## 2017-12-22 NOTE — Progress Notes (Signed)
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  He does get some paresthesias and achiness around his left periscapular region, nothing down to the hands or fingertips.  No trauma, no constitutional symptoms.  Obesity: Would like to restart low-dose phentermine, only taking low-dose Concerta right now.  I reviewed the past medical history, family history, social history, surgical history, and allergies today and no changes were needed.  Please see the problem list section below in epic for further details.  Past Medical History: No past medical history on file. Past Surgical History: Past Surgical History:  Procedure Laterality Date  . INNER EAR SURGERY     Social History: Social History   Socioeconomic History  . Marital status: Single    Spouse name: Not on file  . Number of children: Not on file  . Years of education: Not on file  . Highest education level: Not on file  Occupational History  . Not on file  Social Needs  . Financial resource strain: Not on file  . Food insecurity:    Worry: Not on file    Inability: Not on file  . Transportation needs:    Medical: Not on file    Non-medical: Not on file  Tobacco Use  . Smoking status: Never Smoker  . Smokeless tobacco: Never Used  Substance and Sexual Activity  . Alcohol use: No  . Drug use: No  . Sexual activity: Never  Lifestyle  . Physical activity:    Days per week: Not on file    Minutes per session: Not on file  . Stress: Not on file  Relationships  . Social connections:    Talks on phone: Not on file    Gets together: Not on file    Attends religious service: Not on file    Active member of club or organization: Not on file    Attends meetings of clubs or organizations: Not  on file    Relationship status: Not on file  Other Topics Concern  . Not on file  Social History Narrative  . Not on file   Family History: Family History  Problem Relation Age of Onset  . Cancer Father   . Hypertension Father   . Cancer Paternal Uncle   . Stroke Maternal Grandfather   . Hypertension Paternal Grandmother   . Cancer Paternal Grandfather   . Alzheimer's disease Paternal Grandfather    Allergies: No Known Allergies Medications: See med rec.  Review of Systems: No fevers, chills, night sweats, weight loss, chest pain, or shortness of breath.   Objective:    General: Well Developed, well nourished, and in no acute distress.  Neuro: Alert and oriented x3, extra-ocular muscles intact, sensation grossly intact.  HEENT: Normocephalic, atraumatic, pupils equal round reactive to light, neck supple, no masses, no lymphadenopathy, thyroid nonpalpable.  Skin: Warm and dry, no rashes. Cardiac: Regular rate and rhythm, no murmurs rubs or gallops, no lower extremity edema.  Respiratory: Clear to auscultation bilaterally. Not using accessory muscles, speaking in full sentences. Neck: Negative spurling's Full neck range of motion Grip strength and sensation normal in bilateral hands Strength good C4 to T1 distribution No sensory change to C4 to T1 Reflexes normal  Impression and  Recommendations:    Cervical paraspinal muscle spasm We will start conservatively, benign exam. He will work on posture, ergonomics with his cell phone and monitor at work. Meloxicam, low-dose Flexeril at bedtime, x-rays, home rehab exercises. He will connect with me by phone in 2 weeks to let me know how things are going.  Obesity Starting low-dose phentermine. He is only taking the low-dose Concerta right now. Return in 3 months for a weight check.  ___________________________________________ Gwen Her. Dianah Field, M.D., ABFM., CAQSM. Primary Care and [place] Instructor of [place] of [facility] of Medicine

## 2017-12-22 NOTE — Assessment & Plan Note (Signed)
Starting low-dose phentermine. He is only taking the low-dose Concerta right now. Return in 3 months for a weight check.

## 2017-12-22 NOTE — Assessment & Plan Note (Signed)
We will start conservatively, benign exam. He will work on posture, ergonomics with his cell phone and monitor at work. Meloxicam, low-dose Flexeril at bedtime, x-rays, home rehab exercises. He will connect with me by phone in 2 weeks to let me know how things are going.

## 2018-01-09 ENCOUNTER — Other Ambulatory Visit: Payer: Self-pay | Admitting: Sports Medicine

## 2018-01-09 DIAGNOSIS — Z79899 Other long term (current) drug therapy: Secondary | ICD-10-CM | POA: Diagnosis not present

## 2018-01-09 DIAGNOSIS — I7300 Raynaud's syndrome without gangrene: Secondary | ICD-10-CM

## 2018-01-09 DIAGNOSIS — L700 Acne vulgaris: Secondary | ICD-10-CM | POA: Diagnosis not present

## 2018-01-09 MED FILL — AMLODIPINE 2.5 MG TABLET: 2.5 | 30 days supply | Qty: 30 | Fill #0

## 2018-01-09 MED FILL — MYORISAN 40 MG CAPSULE: 40 | 30 days supply | Qty: 90 | Fill #0

## 2018-01-19 MED FILL — MELOXICAM 15 MG TABLET: 15 | 90 days supply | Qty: 90 | Fill #3

## 2018-02-08 MED FILL — AMLODIPINE 2.5 MG TABLET: 2.5 | 30 days supply | Qty: 30 | Fill #1

## 2018-02-20 DIAGNOSIS — Z79899 Other long term (current) drug therapy: Secondary | ICD-10-CM | POA: Diagnosis not present

## 2018-02-20 DIAGNOSIS — L700 Acne vulgaris: Secondary | ICD-10-CM | POA: Diagnosis not present

## 2018-02-20 MED FILL — MYORISAN 40 MG CAPSULE: 40 | 30 days supply | Qty: 90 | Fill #0

## 2018-02-24 ENCOUNTER — Encounter: Payer: Self-pay | Admitting: Emergency Medicine

## 2018-02-24 ENCOUNTER — Emergency Department (INDEPENDENT_AMBULATORY_CARE_PROVIDER_SITE_OTHER)
Admission: EM | Admit: 2018-02-24 | Discharge: 2018-02-24 | Disposition: A | Discharge status: Home / Self Care | Payer: 59 | Source: Home / Self Care | Attending: Family Medicine | Admitting: Family Medicine

## 2018-02-24 DIAGNOSIS — J029 Acute pharyngitis, unspecified: Secondary | ICD-10-CM | POA: Diagnosis not present

## 2018-02-24 LAB — POCT RAPID STREP A (OFFICE): Rapid Strep A Screen: NEGATIVE

## 2018-02-24 MED ORDER — MAGIC MOUTHWASH W/LIDOCAINE: 5.0000 mL | Freq: Three times a day (TID) | ORAL | 0 refills | Status: DC | PRN

## 2018-02-24 MED ORDER — BENZOCAINE-MENTHOL 15-10 MG MT LOZG: 1.0000 | LOZENGE | Freq: Four times a day (QID) | OROMUCOSAL | 0 refills | Status: DC | PRN

## 2018-02-24 NOTE — ED Provider Notes (Signed)
[name]    CSN: [id number] Arrival date & time: 02/24/18  0932     History   Chief Complaint Chief Complaint  Patient presents with  . Sore Throat    HPI [name] [name] is a 32 y.o. male.   HPI [name] [name] is a 32 y.o. male presenting to UC with c/o sore throat for 2 days.  He feels like there is an open lesion in his throat. He does have a hx of herpangina but he also reports hx of scarlet fever.  He would like to be tested for strep. He has been able to eat and drink without difficulty. He has not tried any medications for his symptoms today. Denies fever, chills, cough, congestion.   History reviewed. No pertinent past medical history.  Patient Active Problem List   Diagnosis Date Noted  . Cervical paraspinal muscle spasm 12/22/2017  . Paronychia of great toe, right 09/14/2017  . Family history of prostate cancer 06/23/2017  . Raynaud phenomenon 12/05/2016  . Hypertriglyceridemia 05/17/2016  . History of Kawasaki's disease 05/13/2016  . Low back pain 10/09/2015  . 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 Obesity 08/19/2014  . Acne vulgaris 06/19/2013  . Adult ADHD 04/30/2013  . Annual physical exam 08/07/2012    Past Surgical History:  Procedure Laterality Date  . INNER EAR SURGERY         Home Medications    Prior to Admission medications   Medication Sig Start Date End Date Taking? Authorizing Provider  amLODipine (NORVASC) 2.5 MG tablet TAKE 1 TABLET (2.5 MG TOTAL) BY MOUTH DAILY. 01/09/18  Yes Silverio Decamp, MD  fluticasone (FLONASE) 50 MCG/ACT nasal spray USE 1 SPRAY IN EACH NOSTRIL TWICE A DAY. USE LEFT HAND FOR RIGHT NOSTRIL AND RIGHT HAND FOR LEFT NOSTRIL 11/16/15  Yes Silverio Decamp, MD  ISOtretinoin (ACCUTANE) 40 MG capsule Take 40 mg by mouth daily.   Yes [provider]  meloxicam (MOBIC) 15 MG tablet One tab PO qAM with breakfast for 2 weeks, then daily prn pain. 12/22/17  Yes Silverio Decamp, MD    methylphenidate (CONCERTA) 18 MG PO CR tablet Take 1 tablet (18 mg total) by mouth daily. In the early afternoon 08/27/15  Yes Silverio Decamp, MD  mupirocin ointment (BACTROBAN) 2 % Apply to affected area TID for 7 days. 09/14/17  Yes Silverio Decamp, MD  niacin 500 MG CR capsule TAKE 1 CAPSULE (500 MG TOTAL) BY MOUTH AT BEDTIME. 08/28/17  Yes Silverio Decamp, MD  phentermine (ADIPEX-P) 37.5 MG tablet Take 0.5 tablets (18.75 mg total) by mouth daily before breakfast. 12/22/17  Yes Silverio Decamp, MD  Benzocaine-Menthol 15-10 MG LOZG Use as directed 1 lozenge in the mouth or throat 4 (four) times daily as needed. 02/24/18   Noe Gens, PA-C  magic mouthwash w/lidocaine SOLN Take 5-10 mLs by mouth 3 (three) times daily as needed for mouth pain. Swish, gargle, and spit. You may swallow a small amount if needed 02/24/18   Noe Gens, PA-C    Family History Family History  Problem Relation Age of Onset  . Cancer Father   . Hypertension Father   . Cancer Paternal Uncle   . Stroke Maternal Grandfather   . Hypertension Paternal Grandmother   . Cancer Paternal Grandfather   . Alzheimer's disease Paternal Grandfather     Social History Social History   Tobacco Use  . Smoking status: Never Smoker  . Smokeless tobacco: Never  Used  Substance Use Topics  . Alcohol use: No  . Drug use: No     Allergies   Patient has no known allergies.   Review of Systems Review of Systems  Constitutional: Negative for chills and fever.  HENT: Positive for sore throat. Negative for congestion, ear pain, trouble swallowing and voice change.   Respiratory: Negative for cough and shortness of breath.   Cardiovascular: Negative for chest pain and palpitations.  Gastrointestinal: Negative for abdominal pain, diarrhea, nausea and vomiting.  Musculoskeletal: Negative for arthralgias, back pain and myalgias.  Skin: Negative for rash.     Physical Exam Triage Vital Signs ED  Triage Vitals  Enc Vitals Group     BP 02/24/18 0954 121/75     Pulse Rate 02/24/18 0954 93     Resp --      Temp 02/24/18 0954 99.4 F (37.4 C)     Temp Source 02/24/18 0954 Oral     SpO2 02/24/18 0954 100 %     Weight 02/24/18 0955 226 lb (102.5 kg)     Height 02/24/18 0955 5\' 9"  (1.753 m)     Head Circumference --      Peak Flow --      Pain Score 02/24/18 0955 3     Pain Loc --      Pain Edu? --      Excl. in [place]? --    No data found.  Updated Vital Signs BP 121/75 (BP Location: Right Arm)   Pulse 93   Temp 99.4 F (37.4 C) (Oral)   Ht 5\' 9"  (1.753 m)   Wt 226 lb (102.5 kg)   SpO2 100%   BMI 33.37 kg/m   Visual Acuity Right Eye Distance:   Left Eye Distance:   Bilateral Distance:    Right Eye Near:   Left Eye Near:    Bilateral Near:     Physical Exam  Constitutional: He is oriented to person, place, and time. He appears well-developed and well-nourished.  HENT:  Head: Normocephalic and atraumatic.  Right Ear: Tympanic membrane normal.  Left Ear: Tympanic membrane normal.  Nose: Nose normal.  Mouth/Throat: Uvula is midline and mucous membranes are normal. Posterior oropharyngeal erythema present. No oropharyngeal exudate, posterior oropharyngeal edema or tonsillar abscesses.  Eyes: EOM are normal.  Neck: Normal range of motion. Neck supple.  Cardiovascular: Normal rate and regular rhythm.  Pulmonary/Chest: Effort normal and breath sounds normal. No stridor. No respiratory distress. He has no wheezes. He has no rhonchi.  Musculoskeletal: Normal range of motion.  Neurological: He is alert and oriented to person, place, and time.  Skin: Skin is warm and dry.  Psychiatric: He has a normal mood and affect. His behavior is normal.  Nursing note and vitals reviewed.    UC Treatments / Results  Labs (all labs ordered are listed, but only abnormal results are displayed) Labs Reviewed  STREP A DNA PROBE  POCT RAPID STREP A (OFFICE)     EKG None  Radiology No results found.  Procedures Procedures (including critical care time)  Medications Ordered in UC Medications - No data to display  Initial Impression / Assessment and Plan / UC Course  I have reviewed the triage vital signs and the nursing notes.  Pertinent labs & imaging results that were available during my care of the patient were reviewed by me and considered in my medical decision making (see chart for details).     Rapid strep: NEGATIVE Culture sent  Will tx symptomatically  Pt requested magic mouthwash. Home care instructions provided below.    Final Clinical Impressions(s) / UC Diagnoses   Final diagnoses:  Acute pharyngitis, unspecified etiology     Discharge Instructions      You may take 500mg  acetaminophen every 4-6 hours or in combination with ibuprofen 400-600mg  every 6-8 hours as needed for pain, inflammation, and fever.  Be sure to drink at least eight 8oz glasses of water to stay well hydrated and get at least 8 hours of sleep at night, preferably more while sick.   Please follow up with family medicine as needed if not improving in 7-10 days.     ED Prescriptions    Medication Sig Dispense Auth. Provider   Benzocaine-Menthol 15-10 MG LOZG Use as directed 1 lozenge in the mouth or throat 4 (four) times daily as needed. 15 lozenge Jaritza Duignan O, PA-C   magic mouthwash w/lidocaine SOLN Take 5-10 mLs by mouth 3 (three) times daily as needed for mouth pain. Swish, gargle, and spit. You may swallow a small amount if needed 50 mL Noe Gens, PA-C     Controlled Substance Prescriptions [place] Controlled Substance Registry consulted? Not Applicable   Tyrell Antonio 02/24/18 1541

## 2018-02-24 NOTE — Discharge Instructions (Signed)
°  You may take 500mg  acetaminophen every 4-6 hours or in combination with ibuprofen 400-600mg  every 6-8 hours as needed for pain, inflammation, and fever.  Be sure to drink at least eight 8oz glasses of water to stay well hydrated and get at least 8 hours of sleep at night, preferably more while sick.   Please follow up with family medicine as needed if not improving in 7-10 days.

## 2018-02-24 NOTE — ED Triage Notes (Signed)
Patient c/o sore throat x 2 days, feels like there's an open lesion in his throat, history of herpangina, no other sx's.

## 2018-02-26 ENCOUNTER — Telehealth: Payer: Self-pay

## 2018-02-26 LAB — STREP A DNA PROBE: Group A Strep Probe: NOT DETECTED

## 2018-02-26 NOTE — Telephone Encounter (Signed)
Left message on VM with lab results and to call UC if any questions or concerns.

## 2018-03-01 ENCOUNTER — Encounter: Payer: Self-pay | Admitting: Sports Medicine

## 2018-03-06 ENCOUNTER — Ambulatory Visit (INDEPENDENT_AMBULATORY_CARE_PROVIDER_SITE_OTHER): Payer: 59 | Admitting: Sports Medicine

## 2018-03-06 ENCOUNTER — Encounter: Payer: Self-pay | Admitting: Sports Medicine

## 2018-03-06 DIAGNOSIS — F909 Attention-deficit hyperactivity disorder, unspecified type: Secondary | ICD-10-CM

## 2018-03-06 DIAGNOSIS — L03031 Cellulitis of right toe: Secondary | ICD-10-CM

## 2018-03-06 MED ORDER — METRONIDAZOLE 500 MG PO TABS: 500.0000 mg | ORAL_TABLET | Freq: Two times a day (BID) | ORAL | 0 refills | Status: AC

## 2018-03-06 MED ORDER — DOXYCYCLINE HYCLATE 100 MG PO TABS: 100.0000 mg | ORAL_TABLET | Freq: Two times a day (BID) | ORAL | 0 refills | Status: AC

## 2018-03-06 MED ORDER — METHYLPHENIDATE HCL ER (OSM) 18 MG PO TBCR: 18.0000 mg | EXTENDED_RELEASE_TABLET | Freq: Every day | ORAL | 0 refills | Status: DC

## 2018-03-06 MED ORDER — TRAMADOL HCL 50 MG PO TABS: 50.0000 mg | ORAL_TABLET | Freq: Three times a day (TID) | ORAL | 0 refills | Status: DC | PRN

## 2018-03-06 MED FILL — traMADol HCL 50 MG TABS: 50 | 7 days supply | Qty: 21 | Fill #0

## 2018-03-06 MED FILL — DOXYCYCLINE HYCLATE 100 MG: 100 | 7 days supply | Qty: 14 | Fill #0

## 2018-03-06 MED FILL — CONCERTA 18 MG TABLET ER: 18 | 90 days supply | Qty: 90 | Fill #0

## 2018-03-06 MED FILL — metroNIDAZOLE 500 MG TABS: 500 | 7 days supply | Qty: 14 | Fill #0

## 2018-03-06 NOTE — Assessment & Plan Note (Signed)
Refilling 18 mg.

## 2018-03-06 NOTE — Progress Notes (Signed)
Subjective:    CC: Recheck toe  HPI: Injury returns, 6 months ago we treated him conservatively for a paronychia of the medial aspect of his right great toe, symptoms persisted and he is here for further evaluation and definitive treatment.  There is some drainage, he has tried silver nitrate treatment with his dermatologist, nothing seems to be working.  Minimal pain.  ADHD: Needs a refill of Concerta.  I reviewed the past medical history, family history, social history, surgical history, and allergies today and no changes were needed.  Please see the problem list section below in epic for further details.  Past Medical History: No past medical history on file. Past Surgical History: Past Surgical History:  Procedure Laterality Date  . INNER EAR SURGERY     Social History: Social History   Socioeconomic History  . Marital status: Single    Spouse name: Not on file  . Number of children: Not on file  . Years of education: Not on file  . Highest education level: Not on file  Occupational History  . Not on file  Social Needs  . Financial resource strain: Not on file  . Food insecurity:    Worry: Not on file    Inability: Not on file  . Transportation needs:    Medical: Not on file    Non-medical: Not on file  Tobacco Use  . Smoking status: Never Smoker  . Smokeless tobacco: Never Used  Substance and Sexual Activity  . Alcohol use: No  . Drug use: No  . Sexual activity: Never  Lifestyle  . Physical activity:    Days per week: Not on file    Minutes per session: Not on file  . Stress: Not on file  Relationships  . Social connections:    Talks on phone: Not on file    Gets together: Not on file    Attends religious service: Not on file    Active member of club or organization: Not on file    Attends meetings of clubs or organizations: Not on file    Relationship status: Not on file  Other Topics Concern  . Not on file  Social History Narrative  . Not on file     Family History: Family History  Problem Relation Age of Onset  . Cancer Father   . Hypertension Father   . Cancer Paternal Uncle   . Stroke Maternal Grandfather   . Hypertension Paternal Grandmother   . Cancer Paternal Grandfather   . Alzheimer's disease Paternal Grandfather    Allergies: No Known Allergies Medications: See med rec.  Review of Systems: No fevers, chills, night sweats, weight loss, chest pain, or shortness of breath.   Objective:    General: Well Developed, well nourished, and in no acute distress.  Neuro: Alert and oriented x3, extra-ocular muscles intact, sensation grossly intact.  HEENT: Normocephalic, atraumatic, pupils equal round reactive to light, neck supple, no masses, no lymphadenopathy, thyroid nonpalpable.  Skin: Warm and dry, no rashes. Cardiac: Regular rate and rhythm, no murmurs rubs or gallops, no lower extremity edema.  Respiratory: Clear to auscultation bilaterally. Not using accessory muscles, speaking in full sentences. Right great toe: Erythematous with purulent drainage and malodorous discharge at the medial nail fold, swollen tissue at the medial nail fold as well.  The edge of the toenail was elevated and a wick was placed between the lateral nail plate and the lateral nail fold.  Impression and Recommendations:    Paronychia of  great toe, right Chronic paronychia. Doxycycline, metronidazole, I placed a wick underneath the medial edge of the nail. Tramadol for the pain associated with the procedure today. Return in 2 weeks, medial nail plate excision if no better.  Adult ADHD Refilling 18 mg.  ___________________________________________ Gwen Her. Dianah Field, M.D., ABFM., CAQSM. Primary Care and [place] Instructor of [place] of [facility] of Medicine

## 2018-03-06 NOTE — Assessment & Plan Note (Signed)
Chronic paronychia. Doxycycline, metronidazole, I placed a wick underneath the medial edge of the nail. Tramadol for the pain associated with the procedure today. Return in 2 weeks, medial nail plate excision if no better.

## 2018-03-13 MED FILL — AMLODIPINE 2.5 MG TABLET: 2.5 | 30 days supply | Qty: 30 | Fill #2

## 2018-03-22 ENCOUNTER — Encounter: Payer: Self-pay | Admitting: Sports Medicine

## 2018-03-22 ENCOUNTER — Ambulatory Visit (INDEPENDENT_AMBULATORY_CARE_PROVIDER_SITE_OTHER): Payer: 59 | Admitting: Sports Medicine

## 2018-03-22 DIAGNOSIS — L03031 Cellulitis of right toe: Secondary | ICD-10-CM | POA: Diagnosis not present

## 2018-03-22 MED ORDER — CEPHALEXIN 500 MG PO CAPS: 500.0000 mg | ORAL_CAPSULE | Freq: Four times a day (QID) | ORAL | 0 refills | Status: DC

## 2018-03-22 MED ORDER — ONDANSETRON 8 MG PO TBDP: 8.0000 mg | ORAL_TABLET | Freq: Three times a day (TID) | ORAL | 3 refills | Status: DC | PRN

## 2018-03-22 MED ORDER — OXYCODONE-ACETAMINOPHEN 5-325 MG PO TABS: 1.0000 | ORAL_TABLET | Freq: Three times a day (TID) | ORAL | 0 refills | Status: DC | PRN

## 2018-03-22 MED FILL — OXYCODONE-ACETAMINOPHEN 5-3: 5-325 | 7 days supply | Qty: 21 | Fill #0

## 2018-03-22 MED FILL — CEPHALEXIN 500 MG CAPSULE: 500 | 7 days supply | Qty: 28 | Fill #0

## 2018-03-22 MED FILL — ONDANSETRON ODT 8 MG TABLET: 8 | 6 days supply | Qty: 20 | Fill #0

## 2018-03-22 NOTE — Assessment & Plan Note (Signed)
Chronic at this point, medial nail plate excision with phenol matricectomy. Percocet for postoperative pain, Zofran for expected nausea. Return to see me in 2 weeks. Adding another course of antibiotics.

## 2018-03-22 NOTE — Progress Notes (Signed)
118/78 83 

## 2018-03-22 NOTE — Patient Instructions (Signed)
Fingernail or Toenail Removal, Adult, Care After  This sheet gives you information about how to care for yourself after your procedure. Your health care provider may also give you more specific instructions. If you have problems or questions, contact your health care provider.  What can I expect after the procedure?  After the procedure, it is common to have:  · Pain.  · Redness.  · Swelling.  · Soreness.    Follow these instructions at home:  · If you have a splint:  ? Do not put pressure on any part of the splint until it is fully hardened. This may take several hours.  ? Wear the splint as told by your health care provider. Remove it only as told by your health care provider.  ? Loosen the splint if your fingers or toes tingle, become numb, or turn cold and blue.  ? Keep the splint clean.  ? If the splint is not waterproof:  § Do not let it get wet.  § Cover it with a watertight covering when you take a bath or a shower.  Wound care    · Follow instructions from your health care provider about how to take care of your wound. Make sure you:  ? Wash your hands with soap and water before you change your bandage (dressing). If soap and water are not available, use hand sanitizer.  ? Change your dressing as told by your health care provider.  ? Keep your dressing dry until your health care provider says it can be removed.  ? Leave stitches (sutures), skin glue, or adhesive strips in place. These skin closures may need to stay in place for 2 weeks or longer. If adhesive strip edges start to loosen and curl up, you may trim the loose edges. Do not remove adhesive strips completely unless your health care provider tells you to do that.  · Check your wound every day for signs of infection. Check for:  ? More redness, swelling, or pain.  ? More fluid or blood.  ? Warmth.  ? Pus or a bad smell.  Managing pain, stiffness, and swelling  · Move your fingers or toes often to avoid stiffness and to lessen swelling.  · Raise  (elevate) the injured area above the level of your heart while you are sitting or lying down. You may need to keep your finger or toe raised or supported on a pillow for 24 hours or as told by your health care provider.  · Soak your hand or foot in warm, soapy water for 10-20 minutes, 3 times a day or as told by your health care provider.  Medicine  · Take over-the-counter and prescription medicines only as told by your health care provider.  · If you were prescribed an antibiotic medicine, use it as told by your health care provider. Do not stop using the antibiotic even if your condition improves.  General instructions  · If you were given a shoe to wear, wear it as told by your health care provider.  · Keep all follow-up visits as told by your health care provider. This is important.  Contact a health care provider if:  · You have more redness, swelling, or pain around your wound.  · You have more fluid or blood coming from your wound.  · Your wound feels warm to the touch.  · You have pus or a bad smell coming from your wound.  · You have a fever.  · Your   finger or toe looks blue or black.  This information is not intended to replace advice given to you by your health care provider. Make sure you discuss any questions you have with your health care provider.  Document Released: 10/03/2014 Document Revised: 05/11/2016 Document Reviewed: 03/21/2016  Elsevier Interactive Patient Education © 2018 [employer]

## 2018-03-22 NOTE — Progress Notes (Signed)
Subjective:    CC: Recheck toenail  HPI: Tanish returns, he is had persistence of pain, swelling, drainage from his right great medial nail fold.  We tried a course of doxycycline and metronidazole at the last visit which did not give him any improvement at all.  Symptoms are moderate, worsening.  Localized without radiation.  I reviewed the past medical history, family history, social history, surgical history, and allergies today and no changes were needed.  Please see the problem list section below in epic for further details.  Past Medical History: No past medical history on file. Past Surgical History: Past Surgical History:  Procedure Laterality Date  . INNER EAR SURGERY     Social History: Social History   Socioeconomic History  . Marital status: Single    Spouse name: Not on file  . Number of children: Not on file  . Years of education: Not on file  . Highest education level: Not on file  Occupational History  . Not on file  Social Needs  . Financial resource strain: Not on file  . Food insecurity:    Worry: Not on file    Inability: Not on file  . Transportation needs:    Medical: Not on file    Non-medical: Not on file  Tobacco Use  . Smoking status: Never Smoker  . Smokeless tobacco: Never Used  Substance and Sexual Activity  . Alcohol use: No  . Drug use: No  . Sexual activity: Never  Lifestyle  . Physical activity:    Days per week: Not on file    Minutes per session: Not on file  . Stress: Not on file  Relationships  . Social connections:    Talks on phone: Not on file    Gets together: Not on file    Attends religious service: Not on file    Active member of club or organization: Not on file    Attends meetings of clubs or organizations: Not on file    Relationship status: Not on file  Other Topics Concern  . Not on file  Social History Narrative  . Not on file   Family History: Family History  Problem Relation Age of Onset  . Cancer  Father   . Hypertension Father   . Cancer Paternal Uncle   . Stroke Maternal Grandfather   . Hypertension Paternal Grandmother   . Cancer Paternal Grandfather   . Alzheimer's disease Paternal Grandfather    Allergies: No Known Allergies Medications: See med rec.  Review of Systems: No fevers, chills, night sweats, weight loss, chest pain, or shortness of breath.   Objective:    General: Well Developed, well nourished, and in no acute distress.  Neuro: Alert and oriented x3, extra-ocular muscles intact, sensation grossly intact.  HEENT: Normocephalic, atraumatic, pupils equal round reactive to light, neck supple, no masses, no lymphadenopathy, thyroid nonpalpable.  Skin: Warm and dry, no rashes. Cardiac: Regular rate and rhythm, no murmurs rubs or gallops, no lower extremity edema.  Respiratory: Clear to auscultation bilaterally. Not using accessory muscles, speaking in full sentences. Right great toe: Ingrown toenail with chronic paronychia, purulence, drainage at the medial nail plate.  Procedure:  Removal of right great medial nail plate with phenol matricectomy. Risks, benefits, alternatives explained to patient. Consent obtained. Time out conducted. Noted no overlying induration or erythema at site of injection. Toe cleaned with alcohol, then a total of 10cc lidocaine 2% infiltrated at adjacent webspaces at the location of the bifurcation of  the common digital nerve to proper digital nerves.  Some lidocaine also infiltrated at hyponychium and under nail bed.  Adequate anesthesia ensured. Toe prepped and draped in a sterile fashion. Nail elevator used to separate nail plate from nail bed. Clippers used to cut toenail in a longitudinal fashion to proximal nail fold and matrix. Hemostat then used to separate nail fragment from surrounding structures. Nail bed and matrix treated. Minor bleeding controlled with pressure and phenol. Antibiotic ointment applied. Toe  dressed. Advised to return if increased redness, swelling, drainage, fevers, or chills.  Impression and Recommendations:    Paronychia of great toe, right Chronic at this point, medial nail plate excision with phenol matricectomy. Percocet for postoperative pain, Zofran for expected nausea. Return to see me in 2 weeks. Adding another course of antibiotics.  ___________________________________________ Gwen Her. Dianah Field, M.D., ABFM., CAQSM. Primary Care and [place] Instructor of [place] of [facility] of Medicine

## 2018-03-23 ENCOUNTER — Ambulatory Visit: Payer: 59 | Admitting: Sports Medicine

## 2018-04-05 ENCOUNTER — Encounter: Payer: Self-pay | Admitting: Sports Medicine

## 2018-04-05 ENCOUNTER — Ambulatory Visit (INDEPENDENT_AMBULATORY_CARE_PROVIDER_SITE_OTHER): Payer: 59 | Admitting: Sports Medicine

## 2018-04-05 DIAGNOSIS — R635 Abnormal weight gain: Secondary | ICD-10-CM

## 2018-04-05 DIAGNOSIS — L03031 Cellulitis of right toe: Secondary | ICD-10-CM

## 2018-04-05 MED ORDER — PHENTERMINE HCL 37.5 MG PO TABS: ORAL_TABLET | ORAL | 0 refills | Status: DC

## 2018-04-05 MED FILL — PHENTERMINE 37.5 MG TABLET: 37.5 | 30 days supply | Qty: 30 | Fill #0

## 2018-04-05 NOTE — Assessment & Plan Note (Signed)
Restarting phentermine per Rosana Berger request. Return monthly for weight checks and refills.

## 2018-04-05 NOTE — Assessment & Plan Note (Signed)
Medial nail plate excision, phenol matricectomy done 2 weeks ago, doing extremely well. I debrided some of the tissue down to clean, bleeding granulation surface. Band-Aid applied, return as needed for this.

## 2018-04-05 NOTE — Progress Notes (Signed)
Subjective:    CC: Postop wound check  HPI: [name] [name] returns, he is a pleasant 32 year old male, 2 weeks ago we did a medial nail plate excision with phenol matricectomy of his right great toe, he is doing extremely well.  In addition he would like to restart phentermine.  I reviewed the past medical history, family history, social history, surgical history, and allergies today and no changes were needed.  Please see the problem list section below in epic for further details.  Past Medical History: No past medical history on file. Past Surgical History: Past Surgical History:  Procedure Laterality Date  . INNER EAR SURGERY     Social History: Social History   Socioeconomic History  . Marital status: Single    Spouse name: Not on file  . Number of children: Not on file  . Years of education: Not on file  . Highest education level: Not on file  Occupational History  . Not on file  Social Needs  . Financial resource strain: Not on file  . Food insecurity:    Worry: Not on file    Inability: Not on file  . Transportation needs:    Medical: Not on file    Non-medical: Not on file  Tobacco Use  . Smoking status: Never Smoker  . Smokeless tobacco: Never Used  Substance and Sexual Activity  . Alcohol use: No  . Drug use: No  . Sexual activity: Never  Lifestyle  . Physical activity:    Days per week: Not on file    Minutes per session: Not on file  . Stress: Not on file  Relationships  . Social connections:    Talks on phone: Not on file    Gets together: Not on file    Attends religious service: Not on file    Active member of club or organization: Not on file    Attends meetings of clubs or organizations: Not on file    Relationship status: Not on file  Other Topics Concern  . Not on file  Social History Narrative  . Not on file   Family History: Family History  Problem Relation Age of Onset  . Cancer Father   . Hypertension Father   . Cancer Paternal Uncle    . Stroke Maternal Grandfather   . Hypertension Paternal Grandmother   . Cancer Paternal Grandfather   . Alzheimer's disease Paternal Grandfather    Allergies: No Known Allergies Medications: See med rec.  Review of Systems: No fevers, chills, night sweats, weight loss, chest pain, or shortness of breath.   Objective:    General: Well Developed, well nourished, and in no acute distress.  Neuro: Alert and oriented x3, extra-ocular muscles intact, sensation grossly intact.  HEENT: Normocephalic, atraumatic, pupils equal round reactive to light, neck supple, no masses, no lymphadenopathy, thyroid nonpalpable.  Skin: Warm and dry, no rashes. Cardiac: Regular rate and rhythm, no murmurs rubs or gallops, no lower extremity edema.  Respiratory: Clear to auscultation bilaterally. Not using accessory muscles, speaking in full sentences. Right great toe: Healing medial nail plate excision post matricectomy.  No signs of axial superinfection, no pain.  I aggressively debrided the overlying eschar and devitalized tissues with a gauze pad and peroxide down to bleeding granulation tissue.  Impression and Recommendations:    Paronychia of great toe, right Medial nail plate excision, phenol matricectomy done 2 weeks ago, doing extremely well. I debrided some of the tissue down to clean, bleeding granulation surface. Band-Aid applied,  return as needed for this.  Abnormal weight gain Restarting phentermine per Rosana Berger request. Return monthly for weight checks and refills.  I spent 25 minutes with this patient, greater than 50% was face-to-face time counseling regarding the above diagnoses ___________________________________________ Gwen Her. Dianah Field, M.D., ABFM., CAQSM. Primary Care and [place] Instructor of [place] of [facility] of [employer]

## 2018-04-25 MED FILL — AMLODIPINE 2.5 MG TABLET: 2.5 | 30 days supply | Qty: 30 | Fill #3

## 2018-04-30 ENCOUNTER — Other Ambulatory Visit: Payer: Self-pay | Admitting: Sports Medicine

## 2018-04-30 DIAGNOSIS — M545 Low back pain, unspecified: Secondary | ICD-10-CM

## 2018-04-30 DIAGNOSIS — G8929 Other chronic pain: Secondary | ICD-10-CM

## 2018-04-30 MED FILL — MELOXICAM 15 MG TABLET: 15 | 90 days supply | Qty: 90 | Fill #0

## 2018-05-08 ENCOUNTER — Encounter: Payer: Self-pay | Admitting: Sports Medicine

## 2018-05-08 ENCOUNTER — Ambulatory Visit (INDEPENDENT_AMBULATORY_CARE_PROVIDER_SITE_OTHER): Payer: 59 | Admitting: Sports Medicine

## 2018-05-08 DIAGNOSIS — R635 Abnormal weight gain: Secondary | ICD-10-CM

## 2018-05-08 MED ORDER — PHENTERMINE HCL 37.5 MG PO TABS: ORAL_TABLET | ORAL | 0 refills | Status: DC

## 2018-05-08 MED FILL — PHENTERMINE 37.5 MG TABLET: 37.5 | 30 days supply | Qty: 30 | Fill #0

## 2018-05-08 NOTE — Assessment & Plan Note (Signed)
6 pound weight loss after the first month of phentermine, refilling medication, return in 1 month, entering the second month.

## 2018-05-08 NOTE — Progress Notes (Signed)
  Subjective:    CC: Weight check  HPI: Denzel returns, he is lost 6 pounds on phentermine.  Toe is doing well.  I reviewed the past medical history, family history, social history, surgical history, and allergies today and no changes were needed.  Please see the problem list section below in epic for further details.  Past Medical History: No past medical history on file. Past Surgical History: Past Surgical History:  Procedure Laterality Date  . INNER EAR SURGERY     Social History: Social History   Socioeconomic History  . Marital status: Single    Spouse name: Not on file  . Number of children: Not on file  . Years of education: Not on file  . Highest education level: Not on file  Occupational History  . Not on file  Social Needs  . Financial resource strain: Not on file  . Food insecurity:    Worry: Not on file    Inability: Not on file  . Transportation needs:    Medical: Not on file    Non-medical: Not on file  Tobacco Use  . Smoking status: Never Smoker  . Smokeless tobacco: Never Used  Substance and Sexual Activity  . Alcohol use: No  . Drug use: No  . Sexual activity: Never  Lifestyle  . Physical activity:    Days per week: Not on file    Minutes per session: Not on file  . Stress: Not on file  Relationships  . Social connections:    Talks on phone: Not on file    Gets together: Not on file    Attends religious service: Not on file    Active member of club or organization: Not on file    Attends meetings of clubs or organizations: Not on file    Relationship status: Not on file  Other Topics Concern  . Not on file  Social History Narrative  . Not on file   Family History: Family History  Problem Relation Age of Onset  . Cancer Father   . Hypertension Father   . Cancer Paternal Uncle   . Stroke Maternal Grandfather   . Hypertension Paternal Grandmother   . Cancer Paternal Grandfather   . Alzheimer's disease Paternal Grandfather     Allergies: No Known Allergies Medications: See med rec.  Review of Systems: No fevers, chills, night sweats, weight loss, chest pain, or shortness of breath.   Objective:    General: Well Developed, well nourished, and in no acute distress.  Neuro: Alert and oriented x3, extra-ocular muscles intact, sensation grossly intact.  HEENT: Normocephalic, atraumatic, pupils equal round reactive to light, neck supple, no masses, no lymphadenopathy, thyroid nonpalpable.  Skin: Warm and dry, no rashes. Cardiac: Regular rate and rhythm, no murmurs rubs or gallops, no lower extremity edema.  Respiratory: Clear to auscultation bilaterally. Not using accessory muscles, speaking in full sentences.  Impression and Recommendations:    Abnormal weight gain 6 pound weight loss after the first month of phentermine, refilling medication, return in 1 month, entering the second month. ___________________________________________ Gwen Her. Dianah Field, M.D., ABFM., CAQSM. Primary Care and [place] Instructor of [place] of [facility] of Medicine

## 2018-05-25 ENCOUNTER — Other Ambulatory Visit: Payer: Self-pay | Admitting: Sports Medicine

## 2018-05-25 DIAGNOSIS — I7300 Raynaud's syndrome without gangrene: Secondary | ICD-10-CM

## 2018-05-25 MED FILL — AMLODIPINE 2.5 MG TABLET: 2.5 | 90 days supply | Qty: 90 | Fill #0

## 2018-06-05 ENCOUNTER — Ambulatory Visit: Payer: 59 | Admitting: Sports Medicine

## 2018-06-06 ENCOUNTER — Encounter: Payer: Self-pay | Admitting: Sports Medicine

## 2018-06-06 ENCOUNTER — Ambulatory Visit (INDEPENDENT_AMBULATORY_CARE_PROVIDER_SITE_OTHER): Payer: 59 | Admitting: Sports Medicine

## 2018-06-06 DIAGNOSIS — R635 Abnormal weight gain: Secondary | ICD-10-CM | POA: Diagnosis not present

## 2018-06-06 DIAGNOSIS — F909 Attention-deficit hyperactivity disorder, unspecified type: Secondary | ICD-10-CM | POA: Diagnosis not present

## 2018-06-06 DIAGNOSIS — Z0000 Encounter for general adult medical examination without abnormal findings: Secondary | ICD-10-CM

## 2018-06-06 MED ORDER — METHYLPHENIDATE HCL ER (OSM) 18 MG PO TBCR: 18.0000 mg | EXTENDED_RELEASE_TABLET | Freq: Every day | ORAL | 0 refills | Status: DC

## 2018-06-06 MED ORDER — PHENTERMINE HCL 37.5 MG PO TABS: ORAL_TABLET | ORAL | 0 refills | Status: DC

## 2018-06-06 MED FILL — PHENTERMINE 37.5 MG TABLET: 37.5 | 30 days supply | Qty: 30 | Fill #0

## 2018-06-06 MED FILL — CONCERTA 18 MG TABLET ER: 18 | 90 days supply | Qty: 90 | Fill #0

## 2018-06-06 NOTE — Assessment & Plan Note (Signed)
Flu clinic is going to happen at his office in about a week, so no flu shot here today.
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
## 2018-06-06 NOTE — Assessment & Plan Note (Signed)
Refilling medication

## 2018-06-06 NOTE — Assessment & Plan Note (Addendum)
6 pound weight loss the first month, 3 pound weight loss the second month but his mom has been staying with him and doing a lot of scrumptuous cooking. Refilling phentermine. Return in a month, if he plateaus over this next month we will add Topamax will consider switching to phendimetrazine.

## 2018-07-18 ENCOUNTER — Ambulatory Visit (INDEPENDENT_AMBULATORY_CARE_PROVIDER_SITE_OTHER): Payer: 59 | Admitting: Sports Medicine

## 2018-07-18 ENCOUNTER — Encounter: Payer: Self-pay | Admitting: Sports Medicine

## 2018-07-18 VITALS — BP 111/73 | HR 84 | Ht 69.0 in | Wt 209.0 lb

## 2018-07-18 DIAGNOSIS — R635 Abnormal weight gain: Secondary | ICD-10-CM

## 2018-07-18 DIAGNOSIS — Z23 Encounter for immunization: Secondary | ICD-10-CM | POA: Diagnosis not present

## 2018-07-18 MED ORDER — PHENTERMINE HCL 37.5 MG PO TABS: ORAL_TABLET | ORAL | 0 refills | Status: DC

## 2018-07-18 MED FILL — PHENTERMINE 37.5 MG TABLET: 37.5 | 30 days supply | Qty: 30 | Fill #0
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
## 2018-08-08 MED FILL — MELOXICAM 15 MG TABLET: 15 | 90 days supply | Qty: 90 | Fill #1

## 2018-08-15 ENCOUNTER — Encounter: Payer: Self-pay | Admitting: Sports Medicine

## 2018-08-15 ENCOUNTER — Ambulatory Visit (INDEPENDENT_AMBULATORY_CARE_PROVIDER_SITE_OTHER): Payer: 59 | Admitting: Sports Medicine

## 2018-08-15 DIAGNOSIS — R635 Abnormal weight gain: Secondary | ICD-10-CM | POA: Diagnosis not present

## 2018-08-15 MED ORDER — PHENTERMINE HCL 37.5 MG PO TABS: ORAL_TABLET | ORAL | 0 refills | Status: DC

## 2018-08-15 MED FILL — PHENTERMINE 37.5 MG TABLET: 37.5 | 30 days supply | Qty: 30 | Fill #0

## 2018-08-15 NOTE — Assessment & Plan Note (Signed)
Only a few pounds, but is only been 3 weeks, refilling phentermine, entering the fifth month. Return in 5 weeks.

## 2018-08-15 NOTE — Progress Notes (Signed)
  Subjective:    CC: Weight check  HPI: After 3 weeks, only 3 additional pounds.  I reviewed the past medical history, family history, social history, surgical history, and allergies today and no changes were needed.  Please see the problem list section below in epic for further details.  Past Medical History: No past medical history on file. Past Surgical History: Past Surgical History:  Procedure Laterality Date  . INNER EAR SURGERY     Social History: Social History   Socioeconomic History  . Marital status: Single    Spouse name: Not on file  . Number of children: Not on file  . Years of education: Not on file  . Highest education level: Not on file  Occupational History  . Not on file  Social Needs  . Financial resource strain: Not on file  . Food insecurity:    Worry: Not on file    Inability: Not on file  . Transportation needs:    Medical: Not on file    Non-medical: Not on file  Tobacco Use  . Smoking status: Never Smoker  . Smokeless tobacco: Never Used  Substance and Sexual Activity  . Alcohol use: No  . Drug use: No  . Sexual activity: Never  Lifestyle  . Physical activity:    Days per week: Not on file    Minutes per session: Not on file  . Stress: Not on file  Relationships  . Social connections:    Talks on phone: Not on file    Gets together: Not on file    Attends religious service: Not on file    Active member of club or organization: Not on file    Attends meetings of clubs or organizations: Not on file    Relationship status: Not on file  Other Topics Concern  . Not on file  Social History Narrative  . Not on file   Family History: Family History  Problem Relation Age of Onset  . Cancer Father   . Hypertension Father   . Cancer Paternal Uncle   . Stroke Maternal Grandfather   . Hypertension Paternal Grandmother   . Cancer Paternal Grandfather   . Alzheimer's disease Paternal Grandfather    Allergies: No Known  Allergies Medications: See med rec.  Review of Systems: No fevers, chills, night sweats, weight loss, chest pain, or shortness of breath.   Objective:    General: Well Developed, well nourished, and in no acute distress.  Neuro: Alert and oriented x3, extra-ocular muscles intact, sensation grossly intact.  HEENT: Normocephalic, atraumatic, pupils equal round reactive to light, neck supple, no masses, no lymphadenopathy, thyroid nonpalpable.  Skin: Warm and dry, no rashes. Cardiac: Regular rate and rhythm, no murmurs rubs or gallops, no lower extremity edema.  Respiratory: Clear to auscultation bilaterally. Not using accessory muscles, speaking in full sentences.  Impression and Recommendations:    Abnormal weight gain Only a few pounds, but is only been 3 weeks, refilling phentermine, entering the fifth month. Return in 5 weeks. ___________________________________________ Gwen Her. Dianah Field, M.D., ABFM., CAQSM. Primary Care and Sports Medicine [facility]  Adjunct Professor of [place] of [facility] of Medicine

## 2018-09-03 ENCOUNTER — Other Ambulatory Visit: Payer: Self-pay | Admitting: Sports Medicine

## 2018-09-03 DIAGNOSIS — I7300 Raynaud's syndrome without gangrene: Secondary | ICD-10-CM

## 2018-09-03 MED FILL — AMLODIPINE 2.5 MG TABLET: 2.5 | 30 days supply | Qty: 30 | Fill #0

## 2018-09-25 ENCOUNTER — Ambulatory Visit (INDEPENDENT_AMBULATORY_CARE_PROVIDER_SITE_OTHER): Payer: 59 | Admitting: Sports Medicine

## 2018-09-25 ENCOUNTER — Encounter: Payer: Self-pay | Admitting: Sports Medicine

## 2018-09-25 DIAGNOSIS — R635 Abnormal weight gain: Secondary | ICD-10-CM | POA: Diagnosis not present

## 2018-09-25 DIAGNOSIS — F909 Attention-deficit hyperactivity disorder, unspecified type: Secondary | ICD-10-CM

## 2018-09-25 MED ORDER — METHYLPHENIDATE HCL ER (OSM) 18 MG PO TBCR: 18.0000 mg | EXTENDED_RELEASE_TABLET | Freq: Every day | ORAL | 0 refills | Status: DC

## 2018-09-25 MED ORDER — PHENTERMINE HCL 37.5 MG PO TABS: ORAL_TABLET | ORAL | 0 refills | Status: DC

## 2018-09-25 MED FILL — CONCERTA 18 MG TABLET ER: 18 | 90 days supply | Qty: 90 | Fill #0

## 2018-09-25 MED FILL — PHENTERMINE 37.5 MG TABLET: 37.5 | 30 days supply | Qty: 30 | Fill #0

## 2018-09-25 NOTE — Progress Notes (Signed)
  Subjective:    CC: Follow-up  HPI: Abnormal weight gain: 3 additional pound weight loss.  Entering the sixth month.  Has been hesitant to do Topamax due to the cognitive side effects.  ADHD: Needs a refill on Concerta.  I reviewed the past medical history, family history, social history, surgical history, and allergies today and no changes were needed.  Please see the problem list section below in epic for further details.  Past Medical History: No past medical history on file. Past Surgical History: Past Surgical History:  Procedure Laterality Date  . INNER EAR SURGERY     Social History: Social History   Socioeconomic History  . Marital status: Single    Spouse name: Not on file  . Number of children: Not on file  . Years of education: Not on file  . Highest education level: Not on file  Occupational History  . Not on file  Social Needs  . Financial resource strain: Not on file  . Food insecurity:    Worry: Not on file    Inability: Not on file  . Transportation needs:    Medical: Not on file    Non-medical: Not on file  Tobacco Use  . Smoking status: Never Smoker  . Smokeless tobacco: Never Used  Substance and Sexual Activity  . Alcohol use: No  . Drug use: No  . Sexual activity: Never  Lifestyle  . Physical activity:    Days per week: Not on file    Minutes per session: Not on file  . Stress: Not on file  Relationships  . Social connections:    Talks on phone: Not on file    Gets together: Not on file    Attends religious service: Not on file    Active member of club or organization: Not on file    Attends meetings of clubs or organizations: Not on file    Relationship status: Not on file  Other Topics Concern  . Not on file  Social History Narrative  . Not on file   Family History: Family History  Problem Relation Age of Onset  . Cancer Father   . Hypertension Father   . Cancer Paternal Uncle   . Stroke Maternal Grandfather   . Hypertension  Paternal Grandmother   . Cancer Paternal Grandfather   . Alzheimer's disease Paternal Grandfather    Allergies: No Known Allergies Medications: See med rec.  Review of Systems: No fevers, chills, night sweats, weight loss, chest pain, or shortness of breath.   Objective:    General: Well Developed, well nourished, and in no acute distress.  Neuro: Alert and oriented x3, extra-ocular muscles intact, sensation grossly intact.  HEENT: Normocephalic, atraumatic, pupils equal round reactive to light, neck supple, no masses, no lymphadenopathy, thyroid nonpalpable.  Skin: Warm and dry, no rashes. Cardiac: Regular rate and rhythm, no murmurs rubs or gallops, no lower extremity edema.  Respiratory: Clear to auscultation bilaterally. Not using accessory muscles, speaking in full sentences.  Impression and Recommendations:    Abnormal weight gain 3 pound weight loss. Entering the sixth month. We will discuss the addition of Topamax at the next visit. ___________________________________________ Gwen Her. Dianah Field, M.D., ABFM., CAQSM. Primary Care and Sports Medicine [facility]  Adjunct Professor of [place] of [facility] of Medicine

## 2018-09-25 NOTE — Assessment & Plan Note (Signed)
3 pound weight loss. Entering the sixth month. We will discuss the addition of Topamax at the next visit.

## 2018-10-04 MED FILL — AMLODIPINE 2.5 MG TABLET: 2.5 | 60 days supply | Qty: 60 | Fill #1

## 2018-10-23 ENCOUNTER — Encounter: Payer: Self-pay | Admitting: Sports Medicine

## 2018-10-23 ENCOUNTER — Ambulatory Visit (INDEPENDENT_AMBULATORY_CARE_PROVIDER_SITE_OTHER): Payer: 59 | Admitting: Sports Medicine

## 2018-10-23 DIAGNOSIS — R635 Abnormal weight gain: Secondary | ICD-10-CM | POA: Diagnosis not present

## 2018-10-23 MED ORDER — PHENTERMINE HCL 37.5 MG PO TABS: 18.7500 mg | ORAL_TABLET | Freq: Every day | ORAL | 0 refills | Status: DC

## 2018-10-23 MED ORDER — TOPIRAMATE 50 MG PO TABS: ORAL_TABLET | ORAL | 0 refills | Status: DC

## 2018-10-23 MED FILL — TOPIRAMATE 50 MG TABLET: 50 | 38 days supply | Qty: 60 | Fill #0

## 2018-10-23 MED FILL — PHENTERMINE 37.5 MG TABLET: 37.5 | 90 days supply | Qty: 45 | Fill #0

## 2018-10-23 NOTE — Assessment & Plan Note (Signed)
Finished 6 full months of phentermine. 1 additional pound weight loss. Starting Topamax and refilling with half dose phentermine for 3 months. Return in 3 months.

## 2018-10-23 NOTE — Progress Notes (Signed)
  Subjective:    CC: Weight check  HPI: [name] [name] returns, he is doing very well, he has lost a single additional pound after 6 full months of phentermine.  I reviewed the past medical history, family history, social history, surgical history, and allergies today and no changes were needed.  Please see the problem list section below in epic for further details.  Past Medical History: History reviewed. No pertinent past medical history. Past Surgical History: Past Surgical History:  Procedure Laterality Date  . INNER EAR SURGERY     Social History: Social History   Socioeconomic History  . Marital status: Single    Spouse name: Not on file  . Number of children: Not on file  . Years of education: Not on file  . Highest education level: Not on file  Occupational History  . Not on file  Social Needs  . Financial resource strain: Not on file  . Food insecurity:    Worry: Not on file    Inability: Not on file  . Transportation needs:    Medical: Not on file    Non-medical: Not on file  Tobacco Use  . Smoking status: Never Smoker  . Smokeless tobacco: Never Used  Substance and Sexual Activity  . Alcohol use: No  . Drug use: No  . Sexual activity: Never  Lifestyle  . Physical activity:    Days per week: Not on file    Minutes per session: Not on file  . Stress: Not on file  Relationships  . Social connections:    Talks on phone: Not on file    Gets together: Not on file    Attends religious service: Not on file    Active member of club or organization: Not on file    Attends meetings of clubs or organizations: Not on file    Relationship status: Not on file  Other Topics Concern  . Not on file  Social History Narrative  . Not on file   Family History: Family History  Problem Relation Age of Onset  . Cancer Father   . Hypertension Father   . Cancer Paternal Uncle   . Stroke Maternal Grandfather   . Hypertension Paternal Grandmother   . Cancer Paternal  Grandfather   . Alzheimer's disease Paternal Grandfather    Allergies: No Known Allergies Medications: See med rec.  Review of Systems: No fevers, chills, night sweats, weight loss, chest pain, or shortness of breath.   Objective:    General: Well Developed, well nourished, and in no acute distress.  Neuro: Alert and oriented x3, extra-ocular muscles intact, sensation grossly intact.  HEENT: Normocephalic, atraumatic, pupils equal round reactive to light, neck supple, no masses, no lymphadenopathy, thyroid nonpalpable.  Skin: Warm and dry, no rashes. Cardiac: Regular rate and rhythm, no murmurs rubs or gallops, no lower extremity edema.  Respiratory: Clear to auscultation bilaterally. Not using accessory muscles, speaking in full sentences.  Impression and Recommendations:    Abnormal weight gain Finished 6 full months of phentermine. 1 additional pound weight loss. Starting Topamax and refilling with half dose phentermine for 3 months. Return in 3 months. ___________________________________________ Gwen Her. Dianah Field, M.D., ABFM., CAQSM. Primary Care and Sports Medicine [facility]  Adjunct Professor of [employer] [facility] of [employer]

## 2018-11-16 MED FILL — MELOXICAM 15 MG TABLET: 15 | 90 days supply | Qty: 90 | Fill #2

## 2018-12-11 ENCOUNTER — Other Ambulatory Visit: Payer: Self-pay | Admitting: Sports Medicine

## 2018-12-11 DIAGNOSIS — I7300 Raynaud's syndrome without gangrene: Secondary | ICD-10-CM

## 2018-12-11 MED FILL — AMLODIPINE 2.5 MG TABLET: 2.5 | 90 days supply | Qty: 90 | Fill #0

## 2018-12-18 ENCOUNTER — Encounter: Payer: Self-pay | Admitting: Sports Medicine

## 2018-12-18 DIAGNOSIS — R635 Abnormal weight gain: Secondary | ICD-10-CM

## 2018-12-19 MED ORDER — TOPIRAMATE 50 MG PO TABS: 50.0000 mg | ORAL_TABLET | Freq: Two times a day (BID) | ORAL | 11 refills | Status: DC

## 2018-12-19 MED FILL — TOPIRAMATE 50 MG TABLET: 50 | 30 days supply | Qty: 60 | Fill #0

## 2018-12-19 NOTE — Assessment & Plan Note (Signed)
Good additional weight loss, continue Topamax twice a day and he should have another month of half dose phentermine.  At the last visit 2 months ago we finish the full 6 months of full dose phentermine.

## 2018-12-21 ENCOUNTER — Other Ambulatory Visit: Payer: Self-pay | Admitting: Sports Medicine

## 2018-12-21 DIAGNOSIS — R635 Abnormal weight gain: Secondary | ICD-10-CM

## 2018-12-21 DIAGNOSIS — I7300 Raynaud's syndrome without gangrene: Secondary | ICD-10-CM

## 2018-12-21 DIAGNOSIS — F909 Attention-deficit hyperactivity disorder, unspecified type: Secondary | ICD-10-CM

## 2018-12-21 MED FILL — CONCERTA 18 MG TABLET ER: 18 | 90 days supply | Qty: 90 | Fill #0

## 2019-01-21 MED FILL — TOPIRAMATE 50 MG TABLET: 50 | 30 days supply | Qty: 60 | Fill #1 | Status: TO

## 2019-02-05 ENCOUNTER — Other Ambulatory Visit: Payer: Self-pay | Admitting: Sports Medicine

## 2019-02-05 DIAGNOSIS — R635 Abnormal weight gain: Secondary | ICD-10-CM

## 2019-02-05 MED ORDER — PHENTERMINE HCL 37.5 MG PO TABS: ORAL_TABLET | ORAL | 0 refills | Status: DC

## 2019-02-05 MED FILL — PHENTERMINE 37.5 MG TABLET: 37.5 | 90 days supply | Qty: 45 | Fill #0

## 2019-02-05 MED FILL — MELOXICAM 15 MG TABLET: 15 | 90 days supply | Qty: 90 | Fill #0

## 2019-02-25 MED FILL — TOPIRAMATE 50 MG TABLET: 50 | 30 days supply | Qty: 60 | Fill #0

## 2019-03-08 MED FILL — AMLODIPINE 2.5 MG TABLET: 2.5 | 90 days supply | Qty: 90 | Fill #0

## 2019-03-29 ENCOUNTER — Other Ambulatory Visit: Payer: Self-pay | Admitting: Sports Medicine

## 2019-03-29 DIAGNOSIS — F909 Attention-deficit hyperactivity disorder, unspecified type: Secondary | ICD-10-CM

## 2019-03-29 MED FILL — TOPIRAMATE 50 MG TABLET: 50 | 30 days supply | Qty: 60 | Fill #1

## 2019-03-29 MED FILL — CONCERTA 18 MG TABLET ER: 18 | 90 days supply | Qty: 90 | Fill #0

## 2019-04-30 MED FILL — TOPIRAMATE 50 MG TABLET: 50 | 30 days supply | Qty: 60 | Fill #2

## 2019-05-13 ENCOUNTER — Other Ambulatory Visit: Payer: Self-pay | Admitting: Sports Medicine

## 2019-05-13 DIAGNOSIS — G8929 Other chronic pain: Secondary | ICD-10-CM

## 2019-05-13 MED FILL — PHENTERMINE 37.5 MG TABLET: 37.5 | 90 days supply | Qty: 45 | Fill #0

## 2019-05-13 MED FILL — MELOXICAM 15 MG TABLET: 15 | 90 days supply | Qty: 90 | Fill #0

## 2019-05-31 MED FILL — TOPIRAMATE 50 MG TABLET: 50 | 30 days supply | Qty: 60 | Fill #3

## 2019-06-06 ENCOUNTER — Other Ambulatory Visit: Payer: Self-pay

## 2019-06-06 ENCOUNTER — Ambulatory Visit (INDEPENDENT_AMBULATORY_CARE_PROVIDER_SITE_OTHER): Payer: 59

## 2019-06-06 ENCOUNTER — Encounter: Payer: Self-pay | Admitting: Sports Medicine

## 2019-06-06 ENCOUNTER — Ambulatory Visit (INDEPENDENT_AMBULATORY_CARE_PROVIDER_SITE_OTHER): Payer: 59 | Admitting: Sports Medicine

## 2019-06-06 DIAGNOSIS — M25561 Pain in right knee: Secondary | ICD-10-CM

## 2019-06-06 DIAGNOSIS — M25562 Pain in left knee: Secondary | ICD-10-CM | POA: Diagnosis not present

## 2019-06-06 NOTE — Assessment & Plan Note (Signed)
Doing a lot of kneeling and painting a wall. I have asked him to try some different positions, activity modification. Continue meloxicam, patellofemoral rehab given. X-rays, wear knee pads. Ice 20 minutes 3-4 times a day. Return to see me in 1 month.

## 2019-06-06 NOTE — Progress Notes (Signed)
Subjective:    CC: Right knee pain  HPI: This is a pleasant 33 year old male, he is been painting his walls, he spends a lot of time in the kneeling position, no kneepads.  He has started to notice pain on his anterolateral knee, just to the lateral aspect of the patellar facets, worse with kneeling, going up and down stairs, bending his knee, no trauma, no mechanical symptoms.  Moderate, persistent.  I reviewed the past medical history, family history, social history, surgical history, and allergies today and no changes were needed.  Please see the problem list section below in epic for further details.  Past Medical History: No past medical history on file. Past Surgical History: Past Surgical History:  Procedure Laterality Date  . INNER EAR SURGERY     Social History: Social History   Socioeconomic History  . Marital status: Single    Spouse name: Not on file  . Number of children: Not on file  . Years of education: Not on file  . Highest education level: Not on file  Occupational History  . Not on file  Social Needs  . Financial resource strain: Not on file  . Food insecurity    Worry: Not on file    Inability: Not on file  . Transportation needs    Medical: Not on file    Non-medical: Not on file  Tobacco Use  . Smoking status: Never Smoker  . Smokeless tobacco: Never Used  Substance and Sexual Activity  . Alcohol use: No  . Drug use: No  . Sexual activity: Never  Lifestyle  . Physical activity    Days per week: Not on file    Minutes per session: Not on file  . Stress: Not on file  Relationships  . Social Herbalist on phone: Not on file    Gets together: Not on file    Attends religious service: Not on file    Active member of club or organization: Not on file    Attends meetings of clubs or organizations: Not on file    Relationship status: Not on file  Other Topics Concern  . Not on file  Social History Narrative  . Not on file   Family  History: Family History  Problem Relation Age of Onset  . Cancer Father   . Hypertension Father   . Cancer Paternal Uncle   . Stroke Maternal Grandfather   . Hypertension Paternal Grandmother   . Cancer Paternal Grandfather   . Alzheimer's disease Paternal Grandfather    Allergies: No Known Allergies Medications: See med rec.  Review of Systems: No fevers, chills, night sweats, weight loss, chest pain, or shortness of breath.   Objective:    General: Well Developed, well nourished, and in no acute distress.  Neuro: Alert and oriented x3, extra-ocular muscles intact, sensation grossly intact.  HEENT: Normocephalic, atraumatic, pupils equal round reactive to light, neck supple, no masses, no lymphadenopathy, thyroid nonpalpable.  Skin: Warm and dry, no rashes. Cardiac: Regular rate and rhythm, no murmurs rubs or gallops, no lower extremity edema.  Respiratory: Clear to auscultation bilaterally. Not using accessory muscles, speaking in full sentences. Right knee: Normal to inspection with no erythema or effusion or obvious bony abnormalities. Palpation normal with no warmth or joint line tenderness or patellar tenderness or condyle tenderness. ROM normal in flexion and extension and lower leg rotation. Ligaments with solid consistent endpoints including ACL, PCL, LCL, MCL. Negative Mcmurray's and provocative meniscal tests.  Non painful patellar compression. Patellar and quadriceps tendons unremarkable. Hamstring and quadriceps strength is normal.  Impression and Recommendations:    Patellofemoral joint pain, right Doing a lot of kneeling and painting a wall. I have asked him to try some different positions, activity modification. Continue meloxicam, patellofemoral rehab given. X-rays, wear knee pads. Ice 20 minutes 3-4 times a day. Return to see me in 1 month.   ___________________________________________ Gwen Her. Dianah Field, M.D., ABFM., CAQSM. Primary Care and Sports  Medicine [facility]  Adjunct Professor of [place] of [facility] of Medicine

## 2019-06-13 ENCOUNTER — Other Ambulatory Visit: Payer: Self-pay | Admitting: Sports Medicine

## 2019-06-13 DIAGNOSIS — I7300 Raynaud's syndrome without gangrene: Secondary | ICD-10-CM

## 2019-06-13 MED FILL — AMLODIPINE 2.5 MG TABLET: 2.5 | 30 days supply | Qty: 30 | Fill #0

## 2019-07-01 ENCOUNTER — Other Ambulatory Visit: Payer: Self-pay | Admitting: Sports Medicine

## 2019-07-01 DIAGNOSIS — F909 Attention-deficit hyperactivity disorder, unspecified type: Secondary | ICD-10-CM

## 2019-07-01 MED FILL — TOPIRAMATE 50 MG TABLET: 50 | 30 days supply | Qty: 60 | Fill #4

## 2019-07-01 MED FILL — CONCERTA 18 MG TABLET ER: 18 | 90 days supply | Qty: 90 | Fill #0

## 2019-07-04 ENCOUNTER — Other Ambulatory Visit: Payer: Self-pay

## 2019-07-04 ENCOUNTER — Encounter: Payer: Self-pay | Admitting: Sports Medicine

## 2019-07-04 ENCOUNTER — Ambulatory Visit (INDEPENDENT_AMBULATORY_CARE_PROVIDER_SITE_OTHER): Payer: 59 | Admitting: Sports Medicine

## 2019-07-04 DIAGNOSIS — M25561 Pain in right knee: Secondary | ICD-10-CM | POA: Diagnosis not present

## 2019-07-04 NOTE — Progress Notes (Signed)
  Subjective:    CC: Follow-up  HPI: Right patellofemoral syndrome: Improving.  I reviewed the past medical history, family history, social history, surgical history, and allergies today and no changes were needed.  Please see the problem list section below in epic for further details.  Past Medical History: No past medical history on file. Past Surgical History: Past Surgical History:  Procedure Laterality Date  . INNER EAR SURGERY     Social History: Social History   Socioeconomic History  . Marital status: Single    Spouse name: Not on file  . Number of children: Not on file  . Years of education: Not on file  . Highest education level: Not on file  Occupational History  . Not on file  Social Needs  . Financial resource strain: Not on file  . Food insecurity    Worry: Not on file    Inability: Not on file  . Transportation needs    Medical: Not on file    Non-medical: Not on file  Tobacco Use  . Smoking status: Never Smoker  . Smokeless tobacco: Never Used  Substance and Sexual Activity  . Alcohol use: No  . Drug use: No  . Sexual activity: Never  Lifestyle  . Physical activity    Days per week: Not on file    Minutes per session: Not on file  . Stress: Not on file  Relationships  . Social Herbalist on phone: Not on file    Gets together: Not on file    Attends religious service: Not on file    Active member of club or organization: Not on file    Attends meetings of clubs or organizations: Not on file    Relationship status: Not on file  Other Topics Concern  . Not on file  Social History Narrative  . Not on file   Family History: Family History  Problem Relation Age of Onset  . Cancer Father   . Hypertension Father   . Cancer Paternal Uncle   . Stroke Maternal Grandfather   . Hypertension Paternal Grandmother   . Cancer Paternal Grandfather   . Alzheimer's disease Paternal Grandfather    Allergies: No Known Allergies  Medications: See med rec.  Review of Systems: No fevers, chills, night sweats, weight loss, chest pain, or shortness of breath.   Objective:    General: Well Developed, well nourished, and in no acute distress.  Neuro: Alert and oriented x3, extra-ocular muscles intact, sensation grossly intact.  HEENT: Normocephalic, atraumatic, pupils equal round reactive to light, neck supple, no masses, no lymphadenopathy, thyroid nonpalpable.  Skin: Warm and dry, no rashes. Cardiac: Regular rate and rhythm, no murmurs rubs or gallops, no lower extremity edema.  Respiratory: Clear to auscultation bilaterally. Not using accessory muscles, speaking in full sentences.  Impression and Recommendations:    Patellofemoral joint pain, right 50% better with patellofemoral rehab, x-rays were unrevealing. If no further improvement or insufficient improvement after 1 month we will certainly proceed with MRI. His father did have what sounds to be a history of femoral condylar osteonecrosis. Amous's pain is more at the patellofemoral joint laterally.   ___________________________________________ Gwen Her. Dianah Field, M.D., ABFM., CAQSM. Primary Care and Sports Medicine [facility]  Adjunct Professor of [employer] [employer]

## 2019-07-04 NOTE — Assessment & Plan Note (Signed)
50% better with patellofemoral rehab, x-rays were unrevealing. If no further improvement or insufficient improvement after 1 month we will certainly proceed with MRI. His father did have what sounds to be a history of femoral condylar osteonecrosis. [name] [name]'s pain is more at the patellofemoral joint laterally.

## 2019-07-09 MED FILL — AMLODIPINE 2.5 MG TABLET: 2.5 | 60 days supply | Qty: 60 | Fill #1

## 2019-07-10 ENCOUNTER — Encounter: Payer: Self-pay | Admitting: Sports Medicine

## 2019-07-30 MED FILL — TOPIRAMATE 50 MG TABLET: 50 | 30 days supply | Qty: 60 | Fill #5

## 2019-08-15 ENCOUNTER — Other Ambulatory Visit: Payer: Self-pay | Admitting: Sports Medicine

## 2019-08-15 DIAGNOSIS — G8929 Other chronic pain: Secondary | ICD-10-CM

## 2019-08-15 DIAGNOSIS — M545 Low back pain, unspecified: Secondary | ICD-10-CM

## 2019-08-15 DIAGNOSIS — R635 Abnormal weight gain: Secondary | ICD-10-CM

## 2019-08-16 MED FILL — PHENTERMINE 37.5 MG TABLET: 37.5 | 90 days supply | Qty: 45 | Fill #0

## 2019-08-16 MED FILL — MELOXICAM 15 MG TABLET: 15 | 90 days supply | Qty: 90 | Fill #0

## 2019-09-02 MED FILL — TOPIRAMATE 50 MG TABLET: 50 | 30 days supply | Qty: 60 | Fill #6

## 2019-09-13 ENCOUNTER — Other Ambulatory Visit: Payer: Self-pay | Admitting: Sports Medicine

## 2019-09-13 DIAGNOSIS — I7300 Raynaud's syndrome without gangrene: Secondary | ICD-10-CM

## 2019-09-13 MED FILL — AMLODIPINE 2.5 MG TABLET: 2.5 | 30 days supply | Qty: 30 | Fill #0

## 2019-10-03 ENCOUNTER — Other Ambulatory Visit: Payer: Self-pay | Admitting: Sports Medicine

## 2019-10-03 DIAGNOSIS — F909 Attention-deficit hyperactivity disorder, unspecified type: Secondary | ICD-10-CM

## 2019-10-03 MED FILL — CONCERTA 18 MG TABLET ER: 18 | 90 days supply | Qty: 90 | Fill #0

## 2019-10-03 MED FILL — TOPIRAMATE 50 MG TABLET: 50 | 30 days supply | Qty: 60 | Fill #7

## 2019-10-16 MED FILL — AMLODIPINE 2.5 MG TABLET: 2.5 | 30 days supply | Qty: 30 | Fill #1

## 2019-11-06 MED FILL — TOPIRAMATE 50 MG TABLET: 50 | 30 days supply | Qty: 60 | Fill #8

## 2019-11-19 ENCOUNTER — Other Ambulatory Visit: Payer: Self-pay | Admitting: Sports Medicine

## 2019-11-19 DIAGNOSIS — R635 Abnormal weight gain: Secondary | ICD-10-CM

## 2019-11-19 DIAGNOSIS — G8929 Other chronic pain: Secondary | ICD-10-CM

## 2019-11-19 DIAGNOSIS — M545 Low back pain, unspecified: Secondary | ICD-10-CM

## 2019-11-19 MED FILL — AMLODIPINE 2.5 MG TABLET: 2.5 | 30 days supply | Qty: 30 | Fill #2

## 2019-11-19 MED FILL — PHENTERMINE 37.5 MG TABLET: 37.5 | 90 days supply | Qty: 45 | Fill #0

## 2019-11-19 MED FILL — MELOXICAM 15 MG TABLET: 15 | 90 days supply | Qty: 90 | Fill #0

## 2019-12-06 MED FILL — TOPIRAMATE 50 MG TABLET: 50 | 30 days supply | Qty: 60 | Fill #9

## 2019-12-24 ENCOUNTER — Other Ambulatory Visit: Payer: Self-pay | Admitting: Sports Medicine

## 2019-12-24 DIAGNOSIS — I7300 Raynaud's syndrome without gangrene: Secondary | ICD-10-CM

## 2019-12-24 MED FILL — AMLODIPINE 2.5 MG TABLET: 2.5 | 90 days supply | Qty: 90 | Fill #0

## 2020-01-07 ENCOUNTER — Other Ambulatory Visit: Payer: Self-pay | Admitting: Sports Medicine

## 2020-01-07 DIAGNOSIS — R635 Abnormal weight gain: Secondary | ICD-10-CM

## 2020-01-07 DIAGNOSIS — F909 Attention-deficit hyperactivity disorder, unspecified type: Secondary | ICD-10-CM

## 2020-01-07 MED FILL — CONCERTA 18 MG TABLET ER: 18 | 90 days supply | Qty: 90 | Fill #0

## 2020-01-07 MED FILL — TOPIRAMATE 50 MG TABLET: 50 | 30 days supply | Qty: 60 | Fill #0

## 2020-01-10 ENCOUNTER — Ambulatory Visit (INDEPENDENT_AMBULATORY_CARE_PROVIDER_SITE_OTHER): Payer: 59 | Admitting: Sports Medicine

## 2020-01-10 ENCOUNTER — Encounter: Payer: Self-pay | Admitting: Sports Medicine

## 2020-01-10 VITALS — BP 122/74 | HR 94 | Ht 69.0 in | Wt 190.0 lb

## 2020-01-10 DIAGNOSIS — R635 Abnormal weight gain: Secondary | ICD-10-CM | POA: Diagnosis not present

## 2020-01-10 DIAGNOSIS — Z0000 Encounter for general adult medical examination without abnormal findings: Secondary | ICD-10-CM

## 2020-01-10 DIAGNOSIS — E559 Vitamin D deficiency, unspecified: Secondary | ICD-10-CM

## 2020-01-10 DIAGNOSIS — N139 Obstructive and reflux uropathy, unspecified: Secondary | ICD-10-CM

## 2020-01-10 DIAGNOSIS — F909 Attention-deficit hyperactivity disorder, unspecified type: Secondary | ICD-10-CM

## 2020-01-10 NOTE — Assessment & Plan Note (Signed)
Continue Topamax twice a day, half dose phentermine, the combination does lead to good maintenance of his weight, he also notes a good improvement in focus.

## 2020-01-10 NOTE — Assessment & Plan Note (Signed)
Continues to do well with Concerta 18, this works well when combined with his low-dose phentermine. 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 I have advised him to get a stand-up workstation at home.

## 2020-01-10 NOTE — Progress Notes (Addendum)
Subjective:    CC: Annual Physical Exam  HPI:  This patient is here for their annual physical  I reviewed the past medical history, family history, social history, surgical history, and allergies today and no changes were needed.  Please see the problem list section below in epic for further details.  Past Medical History: No past medical history on file. Past Surgical History: Past Surgical History:  Procedure Laterality Date  . INNER EAR SURGERY     Social History: Social History   Socioeconomic History  . Marital status: Single    Spouse name: Not on file  . Number of children: Not on file  . Years of education: Not on file  . Highest education level: Not on file  Occupational History  . Not on file  Tobacco Use  . Smoking status: Never Smoker  . Smokeless tobacco: Never Used  Substance and Sexual Activity  . Alcohol use: No  . Drug use: No  . Sexual activity: Never  Other Topics Concern  . Not on file  Social History Narrative  . Not on file   Social Determinants of Health   Financial Resource Strain:   . Difficulty of Paying Living Expenses:   Food Insecurity:   . Worried About Charity fundraiser in the Last Year:   . Arboriculturist in the Last Year:   Transportation Needs:   . Film/video editor (Medical):   Marland Kitchen Lack of Transportation (Non-Medical):   Physical Activity:   . Days of Exercise per Week:   . Minutes of Exercise per Session:   Stress:   . Feeling of Stress :   Social Connections:   . Frequency of Communication with Friends and Family:   . Frequency of Social Gatherings with Friends and Family:   . Attends Religious Services:   . Active Member of Clubs or Organizations:   . Attends Archivist Meetings:   Marland Kitchen Marital Status:    Family History: Family History  Problem Relation Age of Onset  . Cancer Father   . Hypertension Father   . Cancer Paternal Uncle   . Stroke Maternal Grandfather   . Hypertension Paternal  Grandmother   . Cancer Paternal Grandfather   . Alzheimer's disease Paternal Grandfather    Allergies: No Known Allergies Medications: See med rec.  Review of Systems: No headache, visual changes, nausea, vomiting, diarrhea, constipation, dizziness, abdominal pain, skin rash, fevers, chills, night sweats, swollen lymph nodes, weight loss, chest pain, body aches, joint swelling, muscle aches, shortness of breath, mood changes, visual or auditory hallucinations.  Objective:    General: Well Developed, well nourished, and in no acute distress.  Neuro: Alert and oriented x3, extra-ocular muscles intact, sensation grossly intact. Cranial nerves II through XII are intact, motor, sensory, and coordinative functions are all intact. HEENT: Normocephalic, atraumatic, pupils equal round reactive to light, neck supple, no masses, no lymphadenopathy, thyroid nonpalpable. Oropharynx, nasopharynx, external ear canals are unremarkable. Skin: Warm and dry, no rashes noted.  Cardiac: Regular rate and rhythm, no murmurs rubs or gallops.  Respiratory: Clear to auscultation bilaterally. Not using accessory muscles, speaking in full sentences.  Abdominal: Soft, nontender, nondistended, positive bowel sounds, no masses, no organomegaly.  Musculoskeletal: Shoulder, elbow, wrist, hip, knee, ankle stable, and with full range of motion.  Impression and Recommendations:    The patient was counselled, risk factors were discussed, anticipatory guidance given.  Annual physical exam Annual physical as above, checking routine labs. Up-to-date on  screening measures, he has had both of his COVID-19 vaccines. He is requesting a PSA, he has a very strong family history of prostate cancer in nearly all the males in his family, and blood typing. I did explain to him that we would probably wait until 41 before repeating PSA testing. Of note he does get up twice at night to void.  Abnormal weight gain Continue Topamax twice  a day, half dose phentermine, the combination does lead to good maintenance of his weight, he also notes a good improvement in focus.  Adult ADHD Continues to do well with Concerta 18, this works well when combined with his low-dose phentermine. 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 I have advised him to get a stand-up workstation at home.  Vitamin D deficiency Labs look good, vitamin D is again quite low, I am going to call in the 50,000 units supplementation but then also add a daily vitamin D to take.   ___________________________________________ Gwen Her. Dianah Field, M.D., ABFM., CAQSM. Primary Care and Sports Medicine [facility]  Adjunct Professor of [place] of [facility] of Medicine

## 2020-01-10 NOTE — Assessment & Plan Note (Signed)
Annual physical as above, checking routine labs. Up-to-date on screening measures, he has had both of his COVID-19 vaccines. He is requesting a PSA, he has a very strong family history of prostate cancer in nearly all the males in his family, and blood typing. I did explain to him that we would probably wait until 41 before repeating PSA testing. Of note he does get up twice at night to void.

## 2020-01-15 DIAGNOSIS — N139 Obstructive and reflux uropathy, unspecified: Secondary | ICD-10-CM | POA: Diagnosis not present

## 2020-01-15 DIAGNOSIS — Z0000 Encounter for general adult medical examination without abnormal findings: Secondary | ICD-10-CM | POA: Diagnosis not present

## 2020-01-15 DIAGNOSIS — E559 Vitamin D deficiency, unspecified: Secondary | ICD-10-CM | POA: Diagnosis not present

## 2020-01-16 DIAGNOSIS — E559 Vitamin D deficiency, unspecified: Secondary | ICD-10-CM | POA: Insufficient documentation

## 2020-01-16 LAB — CBC
HCT: 49.8 % (ref 38.5–50.0)
Hemoglobin: 16.8 g/dL (ref 13.2–17.1)
MCH: 30.2 pg (ref 27.0–33.0)
MCHC: 33.7 g/dL (ref 32.0–36.0)
MCV: 89.6 fL (ref 80.0–100.0)
MPV: 10.9 fL (ref 7.5–12.5)
Platelets: 310 10*3/uL (ref 140–400)
RBC: 5.56 10*6/uL (ref 4.20–5.80)
RDW: 12.3 % (ref 11.0–15.0)
WBC: 7.2 10*3/uL (ref 3.8–10.8)

## 2020-01-16 LAB — COMPLETE METABOLIC PANEL WITH GFR
AG Ratio: 2.1 (calc) (ref 1.0–2.5)
ALT: 26 U/L (ref 9–46)
AST: 15 U/L (ref 10–40)
Albumin: 4.5 g/dL (ref 3.6–5.1)
Alkaline phosphatase (APISO): 101 U/L (ref 36–130)
BUN: 12 mg/dL (ref 7–25)
CO2: 25 mmol/L (ref 20–32)
Calcium: 9.8 mg/dL (ref 8.6–10.3)
Chloride: 112 mmol/L — ABNORMAL HIGH (ref 98–110)
Creat: 1.23 mg/dL (ref 0.60–1.35)
GFR, Est African American: 89 mL/min/{1.73_m2} (ref >=60)
GFR, Est Non African American: 77 mL/min/{1.73_m2} (ref >=60)
Globulin: 2.1 g/dL (calc) (ref 1.9–3.7)
Glucose, Bld: 94 mg/dL (ref 65–99)
Potassium: 4.7 mmol/L (ref 3.5–5.3)
Sodium: 143 mmol/L (ref 135–146)
Total Bilirubin: 0.6 mg/dL (ref 0.2–1.2)
Total Protein: 6.6 g/dL (ref 6.1–8.1)

## 2020-01-16 LAB — ABO AND RH

## 2020-01-16 LAB — LIPID PANEL W/REFLEX DIRECT LDL
Cholesterol: 140 mg/dL (ref <200)
HDL: 39 mg/dL — ABNORMAL LOW (ref >=40)
LDL Cholesterol (Calc): 77 mg/dL (calc)
Non-HDL Cholesterol (Calc): 101 mg/dL (calc) (ref <130)
Total CHOL/HDL Ratio: 3.6 (calc) (ref <5.0)
Triglycerides: 138 mg/dL (ref <150)

## 2020-01-16 LAB — TSH: TSH: 0.64 mIU/L (ref 0.40–4.50)

## 2020-01-16 LAB — PSA, TOTAL AND FREE
PSA, % Free: 33 % (calc) (ref >25)
PSA, Free: 0.1 ng/mL
PSA, Total: 0.3 ng/mL (ref <=4.0)

## 2020-01-16 LAB — VITAMIN D 25 HYDROXY (VIT D DEFICIENCY, FRACTURES): Vit D, 25-Hydroxy: 9 ng/mL — ABNORMAL LOW (ref 30–100)

## 2020-01-16 MED ORDER — VITAMIN D 50 MCG (2000 UT) PO CAPS: 1.0000 | ORAL_CAPSULE | Freq: Every day | ORAL | 3 refills | Status: DC

## 2020-01-16 MED ORDER — VITAMIN D (ERGOCALCIFEROL) 1.25 MG (50000 UNIT) PO CAPS: 50000.0000 [IU] | ORAL_CAPSULE | ORAL | 0 refills | Status: DC

## 2020-01-16 MED FILL — VITAMIN D3 25 MCG (1000 UT): 25 MCG | 50 days supply | Qty: 100 | Fill #0

## 2020-01-16 MED FILL — VIT D2 1.25 MG (50,000 UNIT: 1.25 MG | 56 days supply | Qty: 8 | Fill #0

## 2020-01-16 NOTE — Assessment & Plan Note (Signed)
Labs look good, vitamin D is again quite low, I am going to call in the 50,000 units supplementation but then also add a daily vitamin D to take.

## 2020-01-16 NOTE — Addendum Note (Signed)
Addended by: Silverio Decamp on: 01/16/2020 08:30 AM   Modules accepted: Orders

## 2020-02-21 ENCOUNTER — Other Ambulatory Visit: Payer: Self-pay | Admitting: Sports Medicine

## 2020-02-21 DIAGNOSIS — R635 Abnormal weight gain: Secondary | ICD-10-CM

## 2020-02-21 DIAGNOSIS — G8929 Other chronic pain: Secondary | ICD-10-CM

## 2020-02-21 MED FILL — MELOXICAM 15 MG TABLET: 15 | 90 days supply | Qty: 90 | Fill #0

## 2020-02-21 MED FILL — PHENTERMINE 37.5 MG TABLET: 37.5 | 90 days supply | Qty: 45 | Fill #0

## 2020-03-09 MED FILL — TOPIRAMATE 50 MG TABLET: 50 | 30 days supply | Qty: 60 | Fill #2

## 2020-03-09 MED FILL — VITAMIN D3 25 MCG (1000 UT): 25 MCG | 50 days supply | Qty: 100 | Fill #1

## 2020-03-25 ENCOUNTER — Other Ambulatory Visit: Payer: Self-pay | Admitting: Sports Medicine

## 2020-03-25 DIAGNOSIS — I7300 Raynaud's syndrome without gangrene: Secondary | ICD-10-CM

## 2020-03-25 MED FILL — AMLODIPINE 2.5 MG TABLET: 2.5 | 90 days supply | Qty: 90 | Fill #0

## 2020-04-10 ENCOUNTER — Other Ambulatory Visit: Payer: Self-pay | Admitting: Sports Medicine

## 2020-04-10 DIAGNOSIS — F909 Attention-deficit hyperactivity disorder, unspecified type: Secondary | ICD-10-CM

## 2020-04-10 MED FILL — CONCERTA 18 MG TABLET ER: 18 | 90 days supply | Qty: 90 | Fill #0

## 2020-04-10 MED FILL — TOPIRAMATE 50 MG TABLET: 50 | 30 days supply | Qty: 60 | Fill #3

## 2020-05-04 MED FILL — VITAMIN D3 25 MCG (1000 UT): 25 MCG | 50 days supply | Qty: 100 | Fill #2

## 2020-05-04 MED FILL — TOPIRAMATE 50 MG TABLET: 50 | 30 days supply | Qty: 60 | Fill #4

## 2020-05-20 ENCOUNTER — Other Ambulatory Visit: Payer: Self-pay | Admitting: Sports Medicine

## 2020-05-20 DIAGNOSIS — R635 Abnormal weight gain: Secondary | ICD-10-CM

## 2020-05-20 DIAGNOSIS — G8929 Other chronic pain: Secondary | ICD-10-CM

## 2020-05-20 DIAGNOSIS — M545 Low back pain, unspecified: Secondary | ICD-10-CM

## 2020-05-20 MED FILL — MELOXICAM 15 MG TABLET: 15 | 90 days supply | Qty: 90 | Fill #0

## 2020-05-20 MED FILL — PHENTERMINE 37.5 MG TABLET: 37.5 | 90 days supply | Qty: 45 | Fill #0

## 2020-06-12 MED FILL — TOPIRAMATE 50 MG TABLET: 50 | 30 days supply | Qty: 60 | Fill #5

## 2020-06-23 MED FILL — VITAMIN D3 25 MCG (1000 UT): 25 MCG | 50 days supply | Qty: 100 | Fill #3

## 2020-06-23 MED FILL — AMLODIPINE 2.5 MG TABLET: 2.5 | 90 days supply | Qty: 90 | Fill #1

## 2020-07-13 ENCOUNTER — Other Ambulatory Visit: Payer: Self-pay | Admitting: Sports Medicine

## 2020-07-13 DIAGNOSIS — F909 Attention-deficit hyperactivity disorder, unspecified type: Secondary | ICD-10-CM

## 2020-07-13 MED FILL — CONCERTA 18 MG TABLET ER: 18 | 90 days supply | Qty: 90 | Fill #0

## 2020-07-13 MED FILL — TOPIRAMATE 50 MG TABLET: 50 | 30 days supply | Qty: 60 | Fill #6

## 2020-08-11 ENCOUNTER — Other Ambulatory Visit (HOSPITAL_BASED_OUTPATIENT_CLINIC_OR_DEPARTMENT_OTHER): Payer: Self-pay | Admitting: Sports Medicine

## 2020-08-11 ENCOUNTER — Other Ambulatory Visit: Payer: Self-pay | Admitting: Sports Medicine

## 2020-08-11 DIAGNOSIS — E559 Vitamin D deficiency, unspecified: Secondary | ICD-10-CM

## 2020-08-11 MED FILL — TOPIRAMATE 50 MG TABLET: 50 | 30 days supply | Qty: 60 | Fill #7

## 2020-08-11 MED FILL — VITAMIN D3 25 MCG (1000 UT): 25 MCG | 100 days supply | Qty: 200 | Fill #0

## 2020-08-24 ENCOUNTER — Other Ambulatory Visit: Payer: Self-pay | Admitting: Sports Medicine

## 2020-08-24 DIAGNOSIS — M545 Low back pain, unspecified: Secondary | ICD-10-CM

## 2020-08-24 DIAGNOSIS — G8929 Other chronic pain: Secondary | ICD-10-CM

## 2020-08-24 MED FILL — MELOXICAM 15 MG TABLET: 15 | 90 days supply | Qty: 90 | Fill #0

## 2020-08-25 ENCOUNTER — Other Ambulatory Visit: Payer: Self-pay | Admitting: Sports Medicine

## 2020-08-25 DIAGNOSIS — R635 Abnormal weight gain: Secondary | ICD-10-CM

## 2020-08-25 MED FILL — PHENTERMINE 37.5 MG TABLET: 37.5 | 90 days supply | Qty: 45 | Fill #0

## 2020-09-09 MED FILL — AMLODIPINE 2.5 MG TABLET: 2.5 | 90 days supply | Qty: 90 | Fill #2

## 2020-09-09 MED FILL — TOPIRAMATE 50 MG TABLET: 50 | 30 days supply | Qty: 60 | Fill #8

## 2020-09-26 DIAGNOSIS — C801 Malignant (primary) neoplasm, unspecified: Secondary | ICD-10-CM

## 2020-09-26 HISTORY — DX: Malignant (primary) neoplasm, unspecified: C80.1

## 2020-10-14 ENCOUNTER — Other Ambulatory Visit: Payer: Self-pay | Admitting: Sports Medicine

## 2020-10-14 DIAGNOSIS — F909 Attention-deficit hyperactivity disorder, unspecified type: Secondary | ICD-10-CM

## 2020-10-14 MED FILL — CONCERTA 18 MG TABLET ER: 18 | 90 days supply | Qty: 90 | Fill #0

## 2020-10-14 MED FILL — TOPIRAMATE 50 MG TABLET: 50 | 30 days supply | Qty: 60 | Fill #9

## 2020-10-14 NOTE — Telephone Encounter (Signed)
Last written 07/13/2020 #90 no refills Last appt 01/10/2020

## 2020-11-10 ENCOUNTER — Other Ambulatory Visit: Payer: Self-pay | Admitting: Sports Medicine

## 2020-11-10 DIAGNOSIS — R635 Abnormal weight gain: Secondary | ICD-10-CM

## 2020-11-10 MED FILL — TOPIRAMATE 50 MG TABLET: 50 | 30 days supply | Qty: 60 | Fill #0

## 2020-11-30 ENCOUNTER — Ambulatory Visit (INDEPENDENT_AMBULATORY_CARE_PROVIDER_SITE_OTHER): Payer: No Typology Code available for payment source | Admitting: Sports Medicine

## 2020-11-30 ENCOUNTER — Other Ambulatory Visit: Payer: Self-pay

## 2020-11-30 ENCOUNTER — Other Ambulatory Visit: Payer: Self-pay | Admitting: Sports Medicine

## 2020-11-30 ENCOUNTER — Encounter: Payer: Self-pay | Admitting: Sports Medicine

## 2020-11-30 DIAGNOSIS — F909 Attention-deficit hyperactivity disorder, unspecified type: Secondary | ICD-10-CM | POA: Diagnosis not present

## 2020-11-30 DIAGNOSIS — G8929 Other chronic pain: Secondary | ICD-10-CM

## 2020-11-30 DIAGNOSIS — R635 Abnormal weight gain: Secondary | ICD-10-CM | POA: Diagnosis not present

## 2020-11-30 DIAGNOSIS — M545 Low back pain, unspecified: Secondary | ICD-10-CM

## 2020-11-30 MED ORDER — SERTRALINE HCL 25 MG PO TABS: 25.0000 mg | ORAL_TABLET | Freq: Every day | ORAL | 2 refills | Status: DC

## 2020-11-30 MED ORDER — MELOXICAM 15 MG PO TABS: 15.0000 mg | ORAL_TABLET | Freq: Every day | ORAL | 0 refills | Status: DC

## 2020-11-30 MED ORDER — PHENTERMINE HCL 37.5 MG PO TABS: 18.7500 mg | ORAL_TABLET | Freq: Every day | ORAL | 1 refills | Status: DC

## 2020-11-30 MED FILL — SERTRALINE HCL 25 MG TABLET: 25 | 30 days supply | Qty: 30 | Fill #0

## 2020-11-30 MED FILL — MELOXICAM 15 MG TABLET: 15 | 90 days supply | Qty: 90 | Fill #0

## 2020-11-30 MED FILL — PHENTERMINE 37.5 MG TABLET: 37.5 | 90 days supply | Qty: 45 | Fill #0

## 2020-11-30 NOTE — Assessment & Plan Note (Addendum)
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 He has been diagnosed with adult ADHD by Dr. Cheryln Manly. Historically things have been well controlled with Concerta as well as phentermine, this has also helped him keep his weight off. 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 He has had symptoms of anxiety and depression in the past but has never been on an SSRI. In an effort to decrease any barriers to focus at work we are going to discontinue his Topamax, I am also going to go ahead and add Zoloft 25 mg daily with 6-week up titrations. We went over the mechanism as well as potential side effects. He also has a behavioral therapist that he plans to get back in touch with.

## 2020-11-30 NOTE — Progress Notes (Addendum)
    Procedures performed today:    None.  Independent interpretation of notes and tests performed by another provider:   None.  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 He has been diagnosed with adult ADHD by Dr. Cheryln Manly. Historically things have been well controlled with Concerta as well as phentermine, this has also helped him keep his weight off. 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 He has had symptoms of anxiety and depression in the past but has never been on an SSRI. In an effort to decrease any barriers to focus at work we are going to discontinue his Topamax, I am also going to go ahead and add Zoloft 25 mg daily with 6-week up titrations. We went over the mechanism as well as potential side effects. He also has a behavioral therapist that he plans to get back in touch with.    ___________________________________________ Gwen Her. Dianah Field, M.D., ABFM., CAQSM. Primary Care and [place] Instructor of [place] of [facility] of Medicine

## 2020-12-01 MED FILL — VITAMIN D3 25 MCG (1000 UT): 25 MCG | 100 days supply | Qty: 200 | Fill #1

## 2020-12-16 ENCOUNTER — Other Ambulatory Visit: Payer: Self-pay | Admitting: Sports Medicine

## 2020-12-16 DIAGNOSIS — F4323 Adjustment disorder with mixed anxiety and depressed mood: Secondary | ICD-10-CM | POA: Insufficient documentation

## 2020-12-16 MED ORDER — VORTIOXETINE HBR 5 MG PO TABS: 5.0000 mg | ORAL_TABLET | Freq: Every day | ORAL | 11 refills | Status: DC

## 2020-12-16 MED FILL — TRINTELLIX 5 MG TABLET: 5 | 30 days supply | Qty: 30 | Fill #0

## 2020-12-16 NOTE — Assessment & Plan Note (Signed)
Sexual dysfunction with current SSRIs, has failed bupropion, sertraline. Anorgasmia. Switching to Trintellix.

## 2021-01-04 ENCOUNTER — Other Ambulatory Visit: Payer: Self-pay | Admitting: Sports Medicine

## 2021-01-04 ENCOUNTER — Other Ambulatory Visit (HOSPITAL_BASED_OUTPATIENT_CLINIC_OR_DEPARTMENT_OTHER): Payer: Self-pay

## 2021-01-04 DIAGNOSIS — I7300 Raynaud's syndrome without gangrene: Secondary | ICD-10-CM

## 2021-01-04 MED ORDER — AMLODIPINE BESYLATE 2.5 MG PO TABS
ORAL_TABLET | Freq: Every day | ORAL | 2 refills | Status: DC
  Filled 2021-01-04: qty 90, 90d supply, fill #0

## 2021-01-12 ENCOUNTER — Encounter: Payer: Self-pay | Admitting: Sports Medicine

## 2021-01-12 ENCOUNTER — Ambulatory Visit (INDEPENDENT_AMBULATORY_CARE_PROVIDER_SITE_OTHER): Payer: No Typology Code available for payment source | Admitting: Sports Medicine

## 2021-01-12 ENCOUNTER — Other Ambulatory Visit (HOSPITAL_BASED_OUTPATIENT_CLINIC_OR_DEPARTMENT_OTHER): Payer: Self-pay

## 2021-01-12 ENCOUNTER — Other Ambulatory Visit: Payer: Self-pay

## 2021-01-12 DIAGNOSIS — F4323 Adjustment disorder with mixed anxiety and depressed mood: Secondary | ICD-10-CM | POA: Diagnosis not present

## 2021-01-12 MED ORDER — VORTIOXETINE HBR 10 MG PO TABS
10.0000 mg | ORAL_TABLET | Freq: Every day | ORAL | 11 refills | Status: DC
  Filled 2021-01-12: qty 30, 30d supply, fill #0
  Filled 2021-02-07: qty 30, 30d supply, fill #1
  Filled 2021-03-10: qty 30, 30d supply, fill #2
  Filled 2021-04-14: qty 30, 30d supply, fill #3
  Filled 2021-05-18: qty 30, 30d supply, fill #4
  Filled 2021-06-16: qty 30, 30d supply, fill #5
  Filled 2021-07-15: qty 30, 30d supply, fill #6
  Filled 2021-08-13: qty 30, 30d supply, fill #7
  Filled 2021-09-10: qty 30, 30d supply, fill #8
  Filled 2021-10-10: qty 30, 30d supply, fill #9
  Filled 2021-11-10: qty 30, 30d supply, fill #10
  Filled 2021-12-13: qty 30, 30d supply, fill #11

## 2021-01-12 NOTE — Progress Notes (Signed)
    Procedures performed today:    None.  Independent interpretation of notes and tests performed by another provider:   None.  Brief History, Exam, Impression, and Recommendations:    Adjustment reaction with anxiety and depression [name] [name] returns, he is a pleasant 35 year old male, he was having some anxiety, depression, and essentially adjustment disorder symptoms due to work, his work circumstances changed, his boss changed and he is a lot happier, he was feeling pretty good on Zoloft initially after having failed Wellbutrin, but unfortunately developed anorgasmia. We switched him to Trintellix, he is only on 5 mg but has not noticed those side effects. He felt "happier" on the Zoloft so we will go ahead and bump him up to 10 of Trintellix with a 6-week PHQ/GAD follow-up, he can really just send me an MyChart message with his scores. This is a chronic process not at goal with pharmacologic management.    ___________________________________________ Gwen Her. Dianah Field, M.D., ABFM., CAQSM. Primary Care and [place] Instructor of [place] of [facility] of Medicine

## 2021-01-12 NOTE — Assessment & Plan Note (Addendum)
[name] [name] returns, he is a pleasant 35 year old male, he was having some anxiety, depression, and essentially adjustment disorder symptoms due to work, his work circumstances changed, his boss changed and he is a lot happier, he was feeling pretty good on Zoloft initially after having failed Wellbutrin, but unfortunately developed anorgasmia. We switched him to Trintellix, he is only on 5 mg but has not noticed those side effects. He felt "happier" on the Zoloft so we will go ahead and bump him up to 10 of Trintellix with a 6-week PHQ/GAD follow-up, he can really just send me an MyChart message with his scores. This is a chronic process not at goal with pharmacologic management.

## 2021-01-28 ENCOUNTER — Other Ambulatory Visit (HOSPITAL_BASED_OUTPATIENT_CLINIC_OR_DEPARTMENT_OTHER): Payer: Self-pay

## 2021-01-28 ENCOUNTER — Other Ambulatory Visit: Payer: Self-pay | Admitting: Sports Medicine

## 2021-01-28 DIAGNOSIS — F909 Attention-deficit hyperactivity disorder, unspecified type: Secondary | ICD-10-CM

## 2021-01-28 MED ORDER — METHYLPHENIDATE HCL ER (OSM) 18 MG PO TBCR
EXTENDED_RELEASE_TABLET | ORAL | 0 refills | Status: DC
  Filled 2021-01-28: qty 90, 90d supply, fill #0

## 2021-02-03 ENCOUNTER — Other Ambulatory Visit (HOSPITAL_BASED_OUTPATIENT_CLINIC_OR_DEPARTMENT_OTHER): Payer: Self-pay

## 2021-02-08 ENCOUNTER — Other Ambulatory Visit (HOSPITAL_BASED_OUTPATIENT_CLINIC_OR_DEPARTMENT_OTHER): Payer: Self-pay

## 2021-02-24 ENCOUNTER — Other Ambulatory Visit: Payer: Self-pay

## 2021-02-25 ENCOUNTER — Other Ambulatory Visit (HOSPITAL_BASED_OUTPATIENT_CLINIC_OR_DEPARTMENT_OTHER): Payer: Self-pay

## 2021-02-25 MED FILL — Phentermine HCl Tab 37.5 MG: ORAL | 90 days supply | Qty: 45 | Fill #0 | Status: AC

## 2021-03-02 ENCOUNTER — Other Ambulatory Visit (HOSPITAL_BASED_OUTPATIENT_CLINIC_OR_DEPARTMENT_OTHER): Payer: Self-pay

## 2021-03-02 ENCOUNTER — Other Ambulatory Visit: Payer: Self-pay | Admitting: Sports Medicine

## 2021-03-02 DIAGNOSIS — G8929 Other chronic pain: Secondary | ICD-10-CM

## 2021-03-02 DIAGNOSIS — M545 Low back pain, unspecified: Secondary | ICD-10-CM

## 2021-03-02 MED ORDER — MELOXICAM 15 MG PO TABS
ORAL_TABLET | ORAL | 0 refills | Status: DC
  Filled 2021-03-02: qty 90, 90d supply, fill #0

## 2021-03-03 ENCOUNTER — Other Ambulatory Visit (HOSPITAL_BASED_OUTPATIENT_CLINIC_OR_DEPARTMENT_OTHER): Payer: Self-pay

## 2021-03-03 ENCOUNTER — Emergency Department
Admission: EM | Admit: 2021-03-03 | Discharge: 2021-03-03 | Disposition: A | Discharge status: Home / Self Care | Payer: No Typology Code available for payment source | Source: Home / Self Care

## 2021-03-03 ENCOUNTER — Other Ambulatory Visit: Payer: Self-pay

## 2021-03-03 ENCOUNTER — Encounter: Payer: Self-pay | Admitting: *Deleted

## 2021-03-03 DIAGNOSIS — H5712 Ocular pain, left eye: Secondary | ICD-10-CM

## 2021-03-03 HISTORY — DX: Other specified behavioral and emotional disorders with onset usually occurring in childhood and adolescence: F98.8

## 2021-03-03 HISTORY — DX: Hyperlipidemia, unspecified: E78.5

## 2021-03-03 HISTORY — DX: Other seasonal allergic rhinitis: J30.2

## 2021-03-03 HISTORY — DX: Raynaud's syndrome without gangrene: I73.00

## 2021-03-03 HISTORY — DX: Anxiety disorder, unspecified: F41.9

## 2021-03-03 MED ORDER — ERYTHROMYCIN 5 MG/GM OP OINT
TOPICAL_OINTMENT | OPHTHALMIC | 0 refills | Status: DC
  Filled 2021-03-03: qty 3.5, 10d supply, fill #0

## 2021-03-03 MED ORDER — FLUORESCEIN SODIUM 1 MG OP STRP
1.0000 | ORAL_STRIP | Freq: Once | OPHTHALMIC | Status: AC
  Administered 2021-03-03: 1 via OPHTHALMIC

## 2021-03-03 MED ORDER — TETRACAINE HCL 0.5 % OP SOLN
1.0000 [drp] | Freq: Once | OPHTHALMIC | Status: AC
  Administered 2021-03-03: 1 [drp] via OPHTHALMIC

## 2021-03-03 NOTE — Discharge Instructions (Signed)
Apply erythromycin to lower eyelid once daily for the next 7 days.

## 2021-03-03 NOTE — ED Provider Notes (Signed)
[facility]  ____________________________________________  Time seen: Approximately 1:12 PM  I have reviewed the triage vital signs and the nursing notes.   HISTORY  Chief Complaint Eye Pain   Historian Patient     HPI [name] [name] is a 35 y.o. male presents to the urgent care with left eye pain and blurry vision.  Patient reports that he had a foreign body sensation when he first awoke today.  Patient states it was difficult for him to open his eye.  Patient states that when he looks at a computer screen up close, he can tell that his vision feels different than what it normally does with his left eye being slightly more blurry.  He denies falls or mechanisms of trauma.  He states that he wears glasses but not contact lenses.  He denies a history of similar symptoms in the past.  No eye redness.  No pain with extraocular eye muscle movement.  No nausea or vomiting.   Past Medical History:  Diagnosis Date  . ADD (attention deficit disorder)   . Anxiety   . Hyperlipidemia   . Raynaud disease   . Seasonal allergies      Immunizations up to date:  Yes.     Past Medical History:  Diagnosis Date  . ADD (attention deficit disorder)   . Anxiety   . Hyperlipidemia   . Raynaud disease   . Seasonal allergies     Patient Active Problem List   Diagnosis Date Noted  . Adjustment reaction with anxiety and depression 12/16/2020  . Vitamin D deficiency 01/16/2020  . Family history of prostate cancer 06/23/2017  . Raynaud phenomenon 12/05/2016  . Hypertriglyceridemia 05/17/2016  . History of Kawasaki's disease 05/13/2016  . 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 Abnormal weight gain 08/19/2014  . Acne vulgaris 06/19/2013  . Adult ADHD 04/30/2013  . Annual physical exam 08/07/2012    Past Surgical History:  Procedure Laterality Date  . INNER EAR SURGERY      Prior to Admission medications   Medication Sig Start Date End Date Taking? Authorizing Provider   amLODipine (NORVASC) 2.5 MG tablet TAKE 1 TABLET BY MOUTH ONCE DAILY 01/04/21 01/04/22 Yes Silverio Decamp, MD  cholecalciferol (VITAMIN D) 25 MCG (1000 UNIT) tablet TAKE 2 TABLETS BY MOUTH DAILY 08/11/20 08/11/21 Yes Silverio Decamp, MD  erythromycin ophthalmic ointment Place a 1/2 inch ribbon of ointment into the lower eyelid for seven days. 03/03/21  Yes Sherral Hammers, Ellison Hughs M, PA-C  fluticasone (FLONASE) 50 MCG/ACT nasal spray USE 1 SPRAY IN EACH NOSTRIL TWICE A DAY. USE LEFT HAND FOR RIGHT NOSTRIL AND RIGHT HAND FOR LEFT NOSTRIL 11/16/15  Yes Silverio Decamp, MD  meloxicam (MOBIC) 15 MG tablet TAKE 1 TABLET (15 MG TOTAL) BY MOUTH DAILY WITH BREAKFAST. 03/02/21 03/02/22 Yes Silverio Decamp, MD  methylphenidate (CONCERTA) 18 MG PO CR tablet TAKE 1 TABLET BY MOUTH DAILY IN THE EARLY AFTERNOON 01/28/21 07/27/21 Yes Silverio Decamp, MD  niacin 500 MG CR capsule TAKE 1 CAPSULE (500 MG TOTAL) BY MOUTH AT BEDTIME. 08/28/17  Yes Silverio Decamp, MD  phentermine (ADIPEX-P) 37.5 MG tablet TAKE 1/2 TABLET (18.75 MG TOTAL) BY MOUTH DAILY BEFORE BREAKFAST. 11/30/20 05/29/21 Yes Silverio Decamp, MD  vortioxetine HBr (TRINTELLIX) 10 MG TABS tablet Take 1 tablet (10 mg total) by mouth daily. 01/12/21 01/12/22 Yes Silverio Decamp, MD  sertraline (ZOLOFT) 25 MG tablet Take 1 tablet (25 mg total) by mouth daily. 11/30/20 12/16/20  Thekkekandam,  Gwen Her, MD  topiramate (TOPAMAX) 50 MG tablet TAKE 1 TABLET BY MOUTH TWICE DAILY 11/10/20 11/30/20  Silverio Decamp, MD    Allergies Patient has no known allergies.  Family History  Problem Relation Age of Onset  . Cancer Father   . Hypertension Father   . Cancer Paternal Uncle   . Stroke Maternal Grandfather   . Hypertension Paternal Grandmother   . Cancer Paternal Grandfather   . Alzheimer's disease Paternal Grandfather     Social History Social History   Tobacco Use  . Smoking status: Never Smoker  . Smokeless tobacco:  Never Used  Vaping Use  . Vaping Use: Never used  Substance Use Topics  . Alcohol use: No  . Drug use: No     Review of Systems  Constitutional: No fever/chills Eyes: Patient has left eye pain. ENT: No upper respiratory complaints. Respiratory: no cough. No SOB/ use of accessory muscles to breath Gastrointestinal:   No nausea, no vomiting.  No diarrhea.  No constipation. Musculoskeletal: Negative for musculoskeletal pain. Skin: Negative for rash, abrasions, lacerations, ecchymosis.    ____________________________________________   PHYSICAL EXAM:  VITAL SIGNS: ED Triage Vitals  Enc Vitals Group     BP 03/03/21 0933 127/79     Pulse Rate 03/03/21 0933 97     Resp 03/03/21 0933 16     Temp 03/03/21 0933 99.1 F (37.3 C)     Temp Source 03/03/21 0933 Oral     SpO2 03/03/21 0933 97 %     Weight 03/03/21 0930 210 lb (95.3 kg)     Height 03/03/21 0930 5\' 9"  (1.753 m)     Head Circumference --      Peak Flow --      Pain Score 03/03/21 0930 2     Pain Loc --      Pain Edu? --      Excl. in [place]? --      Constitutional: Alert and oriented. Well appearing and in no acute distress. Eyes: Conjunctivae are normal. PERRL. EOMI. patient had very small region of fluorescein uptake with staining of the left eye.  No perilimbal redness.  Head: Atraumatic. ENT:      Ears: TMs are pearly bilaterally.      Nose: No congestion/rhinnorhea.      Mouth/Throat: Mucous membranes are moist.  Neck: No stridor.  No cervical spine tenderness to palpation. Cardiovascular: Normal rate, regular rhythm. Normal S1 and S2.  Good peripheral circulation. Respiratory: Normal respiratory effort without tachypnea or retractions. Lungs CTAB. Good air entry to the bases with no decreased or absent breath sounds Gastrointestinal: Bowel sounds x 4 quadrants. Soft and nontender to palpation. No guarding or rigidity. No distention. Musculoskeletal: Full range of motion to all extremities. No obvious  deformities noted Neurologic:  Normal for age. No gross focal neurologic deficits are appreciated.  Skin:  Skin is warm, dry and intact. No rash noted. Psychiatric: Mood and affect are normal for age. Speech and behavior are normal.   ____________________________________________   LABS (all labs ordered are listed, but only abnormal results are displayed)  Labs Reviewed - No data to display ____________________________________________  EKG   ____________________________________________  RADIOLOGY   No results found.  ____________________________________________    PROCEDURES  Procedure(s) performed:     Procedures     Medications  fluorescein ophthalmic strip 1 strip (1 strip Right Eye Given 03/03/21 1000)  tetracaine (PONTOCAINE) 0.5 % ophthalmic solution 1 drop (1 drop Right Eye Given  03/03/21 1000)     ____________________________________________   INITIAL IMPRESSION / ASSESSMENT AND PLAN / ED COURSE  Pertinent labs & imaging results that were available during my care of the patient were reviewed by me and considered in my medical decision making (see chart for details).      Assessment and plan Left eye pain 35 year old male presents to the urgent care with left eye pain that started after patient had a foreign body sensation this morning.  Patient did have a very small region of fluorescein uptake with staining.  Visual acuity documented by nursing staff.  We will treat patient with erythromycin once daily before bed for the next 7 days with ophthalmology follow-up in 1 week to assess for symptomatic improvement.  Patient was advised to seek care at a local emergency department if symptoms seem to be worsening throughout the day.  He voiced understanding.     ____________________________________________  FINAL CLINICAL IMPRESSION(S) / ED DIAGNOSES  Final diagnoses:  Left eye pain      NEW MEDICATIONS STARTED DURING THIS VISIT:  ED Discharge  Orders         Ordered    erythromycin ophthalmic ointment        03/03/21 1008              This chart was dictated using voice recognition software/Dragon. Despite best efforts to proofread, errors can occur which can change the meaning. Any change was purely unintentional.     Lannie Fields, PA-C 03/03/21 1315

## 2021-03-03 NOTE — ED Triage Notes (Signed)
Pt c/o LT eye pain, redness and blurry x this morning. Denies injury. Wears glasses.

## 2021-03-10 ENCOUNTER — Other Ambulatory Visit: Payer: Self-pay | Admitting: Sports Medicine

## 2021-03-10 ENCOUNTER — Ambulatory Visit (INDEPENDENT_AMBULATORY_CARE_PROVIDER_SITE_OTHER): Payer: No Typology Code available for payment source

## 2021-03-10 ENCOUNTER — Ambulatory Visit (INDEPENDENT_AMBULATORY_CARE_PROVIDER_SITE_OTHER): Payer: No Typology Code available for payment source | Admitting: Sports Medicine

## 2021-03-10 ENCOUNTER — Other Ambulatory Visit (HOSPITAL_BASED_OUTPATIENT_CLINIC_OR_DEPARTMENT_OTHER): Payer: Self-pay

## 2021-03-10 ENCOUNTER — Encounter: Payer: Self-pay | Admitting: Sports Medicine

## 2021-03-10 ENCOUNTER — Other Ambulatory Visit: Payer: Self-pay

## 2021-03-10 VITALS — BP 130/87 | HR 96 | Temp 97.4°F

## 2021-03-10 DIAGNOSIS — N1330 Unspecified hydronephrosis: Secondary | ICD-10-CM | POA: Diagnosis not present

## 2021-03-10 DIAGNOSIS — R319 Hematuria, unspecified: Secondary | ICD-10-CM | POA: Diagnosis not present

## 2021-03-10 DIAGNOSIS — R109 Unspecified abdominal pain: Secondary | ICD-10-CM | POA: Diagnosis not present

## 2021-03-10 DIAGNOSIS — R222 Localized swelling, mass and lump, trunk: Secondary | ICD-10-CM

## 2021-03-10 DIAGNOSIS — N200 Calculus of kidney: Secondary | ICD-10-CM | POA: Diagnosis not present

## 2021-03-10 DIAGNOSIS — R10A2 Flank pain, left side: Secondary | ICD-10-CM

## 2021-03-10 MED ORDER — PROMETHAZINE HCL 25 MG PO TABS
25.0000 mg | ORAL_TABLET | Freq: Four times a day (QID) | ORAL | 3 refills | Status: DC | PRN
  Filled 2021-03-10: qty 30, 8d supply, fill #0

## 2021-03-10 MED ORDER — CIPROFLOXACIN HCL 750 MG PO TABS
750.0000 mg | ORAL_TABLET | Freq: Two times a day (BID) | ORAL | 0 refills | Status: AC
  Filled 2021-03-10: qty 14, 7d supply, fill #0

## 2021-03-10 MED ORDER — ONDANSETRON 8 MG PO TBDP
8.0000 mg | ORAL_TABLET | Freq: Three times a day (TID) | ORAL | 3 refills | Status: DC | PRN
  Filled 2021-03-10: qty 20, 7d supply, fill #0

## 2021-03-10 MED ORDER — HYDROCODONE-ACETAMINOPHEN 10-325 MG PO TABS
1.0000 | ORAL_TABLET | Freq: Three times a day (TID) | ORAL | 0 refills | Status: DC | PRN
  Filled 2021-03-10: qty 15, 5d supply, fill #0

## 2021-03-10 NOTE — Addendum Note (Signed)
Addended by: Silverio Decamp on: 03/10/2021 04:21 PM   Modules accepted: Orders, Level of Service

## 2021-03-10 NOTE — Assessment & Plan Note (Addendum)
This is a pleasant 35 year old male, he is a strong family history of nephrolithiasis, lately has been having left-sided flank pain with radiation around to the groin, a pinkish discoloration to his urine. On exam he has tenderness to his left costovertebral angle, we will get urinalysis, stat KUB and renal ultrasound. If nephrolithiasis is noted we will add Flomax and antibiotics, and he would simply prefer lithotripsy over ureteroscopy if given the option.  Update: KUB unremarkable, renal ultrasound shows hydronephrosis on the left consistent with acute obstruction, radiology recommendation is noncontrast CT, will order this stat. It is possible that he had nephrolithiasis on the left and passed the stone already.  Patient was complaining of nausea and increasing pain so we will add ciprofloxacin, high-dose hydrocodone, Zofran, Phenergan.  Update: CT scan personally reviewed, 2 mm stone at the UVJ, mild hydronephrosis, this will probably pass on his own, and he was feeling a lot better with pain medication and antiemetics. Follow-up in 1 week.  Patient was able to catch the stone, stone analysis shows that this is predominantly calcium oxalate as is typical, if he has recurrent nephrolithiasis we will test his urine for citrate and calcium excretion, and consider potassiums citrate supplementation and thiazide diuretic.

## 2021-03-10 NOTE — Addendum Note (Signed)
Addended by: Silverio Decamp on: 03/10/2021 12:05 PM   Modules accepted: Orders

## 2021-03-10 NOTE — Progress Notes (Addendum)
    Procedures performed today:    None.  Independent interpretation of notes and tests performed by another provider:   I personally reviewed the KUB and the renal ultrasound, KUB is normal, renal ultrasound shows acute left hydronephrosis no obvious stones.  CT scan personally reviewed, there is left-sided hydronephrosis, I do see a small 2 mm nephrolith at the ureteric vesicular junction likely obstructing.  Brief History, Exam, Impression, and Recommendations:    Left nephrolithiasis This is a pleasant 35 year old male, he is a strong family history of nephrolithiasis, lately has been having left-sided flank pain with radiation around to the groin, a pinkish discoloration to his urine. On exam he has tenderness to his left costovertebral angle, we will get urinalysis, stat KUB and renal ultrasound. If nephrolithiasis is noted we will add Flomax and antibiotics, and he would simply prefer lithotripsy over ureteroscopy if given the option.  Update: KUB unremarkable, renal ultrasound shows hydronephrosis on the left consistent with acute obstruction, radiology recommendation is noncontrast CT, will order this stat. It is possible that he had nephrolithiasis on the left and passed the stone already.  Patient was complaining of nausea and increasing pain so we will add ciprofloxacin, high-dose hydrocodone, Zofran, Phenergan.  Update: CT scan personally reviewed, 2 mm stone at the UVJ, mild hydronephrosis, this will probably pass on his own, and he was feeling a lot better with pain medication and antiemetics. Follow-up in 1 week.  Patient was able to catch the stone, stone analysis shows that this is predominantly calcium oxalate as is typical, if he has recurrent nephrolithiasis we will test his urine for citrate and calcium excretion, and consider potassiums citrate supplementation and thiazide diuretic.  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 I discussed this with Zuriel on the phone, we will get him referred to thoracic surgery for tissue sampling and hematology oncology.    ___________________________________________ Gwen Her. Dianah Field, M.D., ABFM., CAQSM. Primary Care and [place] Instructor of [employer] [facility]

## 2021-03-10 NOTE — Assessment & Plan Note (Signed)
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 I discussed this with [name] [name] on the phone, we will get him referred to thoracic surgery for tissue sampling and hematology oncology.

## 2021-03-10 NOTE — Addendum Note (Signed)
Addended by: Silverio Decamp on: 03/10/2021 12:19 PM   Modules accepted: Orders

## 2021-03-11 ENCOUNTER — Encounter: Payer: Self-pay | Admitting: *Deleted

## 2021-03-11 ENCOUNTER — Inpatient Hospital Stay: Payer: No Typology Code available for payment source | Attending: Hematology & Oncology | Admitting: *Deleted

## 2021-03-11 ENCOUNTER — Ambulatory Visit (INDEPENDENT_AMBULATORY_CARE_PROVIDER_SITE_OTHER): Payer: No Typology Code available for payment source

## 2021-03-11 DIAGNOSIS — R222 Localized swelling, mass and lump, trunk: Secondary | ICD-10-CM

## 2021-03-11 MED ORDER — IOHEXOL 300 MG/ML  SOLN
100.0000 mL | Freq: Once | INTRAMUSCULAR | Status: AC | PRN
  Administered 2021-03-11: 80 mL via INTRAVENOUS

## 2021-03-11 NOTE — Progress Notes (Signed)
Please see telemedicine encounter from today.   CT of chest scheduled for later today. Will follow tomorrow for results.   Oncology Nurse Navigator Documentation  Oncology Nurse Navigator Flowsheets 03/11/2021  Abnormal Finding Date 03/10/2021  Diagnosis Status Additional Work Up  Navigator Follow Up Date: 03/12/2021  Navigator Follow Up Reason: Appointment Review  Navigator Location [facility]  Referral Date to RadOnc/MedOnc 03/10/2021  Navigator Encounter Type Introductory Phone Call  Patient Visit Type MedOnc  Treatment Phase Abnormal Scans  Barriers/Navigation Needs Coordination of Care;Education  Education Other  Interventions Coordination of Care;Referrals;Psycho-Social Support;Education  Acuity Level 3-Moderate Needs (3-4 Barriers Identified)  Referrals Other  Coordination of Care Appts;Radiology  Education Method Verbal  Time Spent with Patient 45

## 2021-03-11 NOTE — Progress Notes (Signed)
Received referral for this patient due to newly found chest wall mass. Reviewed case with Dr Marin Olp. He would like patient to have a tissue diagnosis before having patient seen in the office. He requests that I call Dr Hendrickson's office and determine whether or not they would perform initial biopsy, or if patient should be seen by IR.  Contacted Dr Hendrickson's office and after a review, they requested that IR perform the initial biopsy, and that depending on results, they would see patient for possible resection.   Reviewed with Dr Marin Olp. Order for stat CT of the chest, and CT Guided Biopsy placed.  Called patient and spoke to him. Reviewed the plan put forth by Dr Marin Olp. Explained that if he wished, we could bring him in to be seen, however Dr Marin Olp preferred to wait until we had a tissue diagnosis. Patient agreed. Explained to him that we needed a CT scan for biopsy planning and then IR would contact him to schedule biopsy.   Patient was given my contact number in case of any questions or concerns.

## 2021-03-12 ENCOUNTER — Encounter: Payer: Self-pay | Admitting: *Deleted

## 2021-03-12 ENCOUNTER — Encounter (HOSPITAL_COMMUNITY): Payer: Self-pay

## 2021-03-12 DIAGNOSIS — N200 Calculus of kidney: Secondary | ICD-10-CM

## 2021-03-12 DIAGNOSIS — R10A2 Flank pain, left side: Secondary | ICD-10-CM

## 2021-03-12 DIAGNOSIS — R109 Unspecified abdominal pain: Secondary | ICD-10-CM

## 2021-03-12 LAB — URINALYSIS W MICROSCOPIC + REFLEX CULTURE
Bacteria, UA: NONE SEEN /HPF
Bilirubin Urine: NEGATIVE
Glucose, UA: NEGATIVE
Hyaline Cast: NONE SEEN /LPF
Ketones, ur: NEGATIVE
Nitrites, Initial: NEGATIVE
Specific Gravity, Urine: 1.02 (ref 1.001–1.035)
Squamous Epithelial / HPF: NONE SEEN /HPF (ref <=5)
pH: 6 (ref 5.0–8.0)

## 2021-03-12 LAB — URINE CULTURE
MICRO NUMBER:: 12010824
Result:: NO GROWTH
SPECIMEN QUALITY:: ADEQUATE

## 2021-03-12 LAB — CULTURE INDICATED

## 2021-03-12 NOTE — Progress Notes (Signed)
Patient had CT chest yesterday afternoon confirming earlier findings. Order for biopsy has already been placed. Message sent to scheduling requesting review. Patient has been scheduled for 03/16/2021.   Oncology Nurse Navigator Documentation  Oncology Nurse Navigator Flowsheets 03/12/2021  Abnormal Finding Date -  Diagnosis Status -  Navigator Follow Up Date: 03/16/2021  Navigator Follow Up Reason: Surgery  Navigator Location [facility]  Referral Date to RadOnc/MedOnc -  Navigator Encounter Type Appt/Treatment Plan Review;Scan Review  Patient Visit Type MedOnc  Treatment Phase Abnormal Scans  Barriers/Navigation Needs Coordination of Care;Education  Education -  Interventions Coordination of Care  Acuity Level 3-Moderate Needs (3-4 Barriers Identified)  Referrals -  Coordination of Care Radiology  Education Method -  Time Spent with Patient 30

## 2021-03-12 NOTE — Progress Notes (Unsigned)
       Patient Demographics  Patient Name  Tristen, Luce Legal Sex  Male DOB  1985/10/21 SSN  [id number] Address  [place] [address] Phone  [phone] [facility])  [phone] (Mobile)     RE: Biopsy Received: Today Markus Daft, MD  Ernestene Mention for CT guided biopsy of right chest wall mass.   [place]         Previous Messages    ----- Message -----  From: Lenore Cordia  Sent: 03/12/2021  11:10 AM EDT  To: Ir Procedure Requests  Subject: Biopsy                                         Procedure Requested:  CT Biopsy    Reason for Procedure:  Right anterolateral chest wall soft tissue mass seen on CT 03/10/2021      Provider Requesting:  Dr Marin Olp  Provider Telephone:  [phone]   Other Info:

## 2021-03-15 ENCOUNTER — Other Ambulatory Visit: Payer: Self-pay

## 2021-03-15 ENCOUNTER — Other Ambulatory Visit: Payer: Self-pay | Admitting: Student

## 2021-03-15 LAB — STONE ANALYSIS: Stone Weight: 0.005 g

## 2021-03-16 ENCOUNTER — Ambulatory Visit (HOSPITAL_COMMUNITY)
Admission: RE | Admit: 2021-03-16 | Discharge: 2021-03-16 | Disposition: A | Discharge status: Home / Self Care | Payer: No Typology Code available for payment source | Source: Ambulatory Visit | Attending: Hematology & Oncology | Admitting: Hematology & Oncology

## 2021-03-16 ENCOUNTER — Encounter (HOSPITAL_COMMUNITY): Payer: Self-pay

## 2021-03-16 ENCOUNTER — Other Ambulatory Visit: Payer: Self-pay

## 2021-03-16 ENCOUNTER — Encounter: Payer: Self-pay | Admitting: *Deleted

## 2021-03-16 ENCOUNTER — Other Ambulatory Visit (HOSPITAL_BASED_OUTPATIENT_CLINIC_OR_DEPARTMENT_OTHER): Payer: Self-pay

## 2021-03-16 DIAGNOSIS — D213 Benign neoplasm of connective and other soft tissue of thorax: Secondary | ICD-10-CM | POA: Insufficient documentation

## 2021-03-16 DIAGNOSIS — G8929 Other chronic pain: Secondary | ICD-10-CM

## 2021-03-16 DIAGNOSIS — Z79899 Other long term (current) drug therapy: Secondary | ICD-10-CM | POA: Insufficient documentation

## 2021-03-16 DIAGNOSIS — R222 Localized swelling, mass and lump, trunk: Secondary | ICD-10-CM | POA: Insufficient documentation

## 2021-03-16 DIAGNOSIS — Z791 Long term (current) use of non-steroidal anti-inflammatories (NSAID): Secondary | ICD-10-CM | POA: Diagnosis not present

## 2021-03-16 DIAGNOSIS — F909 Attention-deficit hyperactivity disorder, unspecified type: Secondary | ICD-10-CM

## 2021-03-16 DIAGNOSIS — M545 Low back pain, unspecified: Secondary | ICD-10-CM

## 2021-03-16 DIAGNOSIS — Z792 Long term (current) use of antibiotics: Secondary | ICD-10-CM | POA: Insufficient documentation

## 2021-03-16 DIAGNOSIS — R635 Abnormal weight gain: Secondary | ICD-10-CM

## 2021-03-16 DIAGNOSIS — I7300 Raynaud's syndrome without gangrene: Secondary | ICD-10-CM

## 2021-03-16 LAB — CBC
HCT: 50.5 % (ref 39.0–52.0)
Hemoglobin: 17 g/dL (ref 13.0–17.0)
MCH: 30 pg (ref 26.0–34.0)
MCHC: 33.7 g/dL (ref 30.0–36.0)
MCV: 89.1 fL (ref 80.0–100.0)
Platelets: 334 10*3/uL (ref 150–400)
RBC: 5.67 MIL/uL (ref 4.22–5.81)
RDW: 12.2 % (ref 11.5–15.5)
WBC: 9.2 10*3/uL (ref 4.0–10.5)
nRBC: 0 % (ref 0.0–0.2)

## 2021-03-16 LAB — PROTIME-INR
INR: 0.9 (ref 0.8–1.2)
Prothrombin Time: 12.1 seconds (ref 11.4–15.2)

## 2021-03-16 MED ORDER — MELOXICAM 15 MG PO TABS
15.0000 mg | ORAL_TABLET | Freq: Every day | ORAL | 0 refills | Status: DC
  Filled 2021-03-16: qty 1, 1d supply, fill #0

## 2021-03-16 MED ORDER — ONDANSETRON HCL 4 MG/2ML IJ SOLN
INTRAMUSCULAR | Status: AC
  Administered 2021-03-16: 4 mg via INTRAVENOUS
  Filled 2021-03-16: qty 2

## 2021-03-16 MED ORDER — LIDOCAINE HCL 1 % IJ SOLN
INTRAMUSCULAR | Status: AC
  Filled 2021-03-16: qty 10

## 2021-03-16 MED ORDER — ONDANSETRON HCL 4 MG/2ML IJ SOLN: 4.0000 mg | Freq: Once | INTRAMUSCULAR | Status: AC

## 2021-03-16 MED ORDER — MIDAZOLAM HCL 2 MG/2ML IJ SOLN
INTRAMUSCULAR | Status: AC
  Filled 2021-03-16: qty 2

## 2021-03-16 MED ORDER — AMLODIPINE BESYLATE 2.5 MG PO TABS
2.5000 mg | ORAL_TABLET | Freq: Every day | ORAL | 0 refills | Status: DC
  Filled 2021-03-16: qty 1, 1d supply, fill #0

## 2021-03-16 MED ORDER — FENTANYL CITRATE (PF) 100 MCG/2ML IJ SOLN
INTRAMUSCULAR | Status: AC
  Filled 2021-03-16: qty 2

## 2021-03-16 MED ORDER — MIDAZOLAM HCL 2 MG/2ML IJ SOLN
INTRAMUSCULAR | Status: AC | PRN
  Administered 2021-03-16: 1 mg via INTRAVENOUS

## 2021-03-16 MED ORDER — FENTANYL CITRATE (PF) 100 MCG/2ML IJ SOLN
INTRAMUSCULAR | Status: AC | PRN
  Administered 2021-03-16: 25 ug via INTRAVENOUS

## 2021-03-16 MED ORDER — SODIUM CHLORIDE 0.9 % IV SOLN
INTRAVENOUS | Status: DC
  Administered 2021-03-16: 10 mL/h via INTRAVENOUS

## 2021-03-16 MED ORDER — FLUTICASONE PROPIONATE 50 MCG/ACT NA SUSP
1.0000 | Freq: Every day | NASAL | 0 refills | Status: DC | PRN
  Filled 2021-03-16: qty 16, 60d supply, fill #0

## 2021-03-16 NOTE — Progress Notes (Signed)
Oncology Nurse Navigator Documentation  Oncology Nurse Navigator Flowsheets 03/16/2021  Abnormal Finding Date -  Diagnosis Status -  Navigator Follow Up Date: 03/19/2021  Navigator Follow Up Reason: Pathology  Navigator Location [facility]  Referral Date to RadOnc/MedOnc -  Navigator Encounter Type Appt/Treatment Plan Review  Patient Visit Type MedOnc  Treatment Phase Abnormal Scans  Barriers/Navigation Needs Coordination of Care;Education  Education -  Interventions None Required  Acuity Level 3-Moderate Needs (3-4 Barriers Identified)  Referrals -  Coordination of Care -  Education Method -  Time Spent with Patient 15

## 2021-03-16 NOTE — H&P (Signed)
Chief Complaint: Patient was seen in consultation today for right anterior chest wall mass biopsy at the request of Ennever,Peter R  Referring Physician(s): Ennever,Peter R  Supervising Physician: Sandi Mariscal  Patient Status: [facility] - In-pt  History of Present Illness: [name] [name] is a 35 y.o. male   Pt was seen in ED 03/11/21 for Lt flank pain CT Stone study revealed renal stone-- since has passed. But also revealed incidental finding of right anterior chest wall mass Denies pain in Rt chest  IMPRESSION: 1. 4.5 x 6.4 x 4.3 cm mass lesion at the right anterolateral chest wall expands and distorts the anterior inferior aspect of the right fifth rib. There is irregular periosteal reaction in the cortex of the rib with possible Codman's triangle appearance. Imaging features raise concern for neoplasm such as Ewing sarcoma/Askin tumor. Osteosarcoma or metastatic lesion not excluded. Solitary fibrous tumor of the pleura is considered less likely given the apparent chest wall invasion. 2. No definite pulmonary metastases although a 3 mm left lower lobe pulmonary nodule is noted. Attention on follow-up recommended. 3. Numerous tiny sclerotic foci in the humeral heads bilaterally and scapulae. Imaging features are nonspecific but may be related to benign etiology such as bone islands or osteopoikilosis.    He is scheduled today for biopsy of same To see Dr Marin Olp later date- after results  Past Medical History:  Diagnosis Date   ADD (attention deficit disorder)    Anxiety    Hyperlipidemia    Raynaud disease    Seasonal allergies     Past Surgical History:  Procedure Laterality Date   INNER EAR SURGERY      Allergies: Patient has no known allergies.  Medications: Prior to Admission medications   Medication Sig Start Date End Date Taking? Authorizing Provider  amLODipine (NORVASC) 2.5 MG tablet TAKE 1 TABLET BY MOUTH ONCE DAILY Patient taking differently: Take  2.5 mg by mouth daily. 01/04/21 01/04/22 Yes Silverio Decamp, MD  ciprofloxacin (CIPRO) 750 MG tablet Take 1 tablet (750 mg total) by mouth 2 (two) times daily for 7 days. 03/10/21 03/17/21 Yes Silverio Decamp, MD  fluticasone (FLONASE) 50 MCG/ACT nasal spray USE 1 SPRAY IN EACH NOSTRIL TWICE A DAY. USE LEFT HAND FOR RIGHT NOSTRIL AND RIGHT HAND FOR LEFT NOSTRIL Patient taking differently: Place 1 spray into both nostrils daily as needed for allergies. 11/16/15  Yes Silverio Decamp, MD  meloxicam (MOBIC) 15 MG tablet TAKE 1 TABLET (15 MG TOTAL) BY MOUTH DAILY WITH BREAKFAST. Patient taking differently: Take 15 mg by mouth daily. 03/02/21 03/02/22 Yes Silverio Decamp, MD  methylphenidate (CONCERTA) 18 MG PO CR tablet TAKE 1 TABLET BY MOUTH DAILY IN THE EARLY AFTERNOON Patient taking differently: Take 18 mg by mouth daily. 01/28/21 07/27/21 Yes Silverio Decamp, MD  niacin 500 MG tablet Take 500 mg by mouth at bedtime.   Yes [provider]  phentermine (ADIPEX-P) 37.5 MG tablet TAKE 1/2 TABLET (18.75 MG TOTAL) BY MOUTH DAILY BEFORE BREAKFAST. Patient taking differently: Take 18.75 mg by mouth daily before breakfast. 11/30/20 05/29/21 Yes Silverio Decamp, MD  vortioxetine HBr (TRINTELLIX) 10 MG TABS tablet Take 1 tablet (10 mg total) by mouth daily. 01/12/21 01/12/22 Yes Silverio Decamp, MD  cholecalciferol (VITAMIN D) 25 MCG (1000 UNIT) tablet TAKE 2 TABLETS BY MOUTH DAILY Patient not taking: No sig reported 08/11/20 08/11/21  Silverio Decamp, MD  erythromycin ophthalmic ointment Place a 1/2 inch ribbon of ointment into  the lower eyelid for seven days. Patient not taking: No sig reported 03/03/21   Lannie Fields, PA-C  HYDROcodone-acetaminophen [facility]) 10-325 MG tablet Take 1 tablet by mouth every 8 (eight) hours as needed. Patient not taking: No sig reported 03/10/21   Silverio Decamp, MD  niacin 500 MG CR capsule TAKE 1 CAPSULE (500 MG TOTAL) BY  MOUTH AT BEDTIME. Patient not taking: No sig reported 08/28/17   Silverio Decamp, MD  ondansetron (ZOFRAN-ODT) 8 MG disintegrating tablet Take 1 tablet (8 mg total) by mouth every 8 (eight) hours as needed for nausea. Patient not taking: No sig reported 03/10/21   Silverio Decamp, MD  promethazine (PHENERGAN) 25 MG tablet Take 1 tablet (25 mg total) by mouth every 6 (six) hours as needed for nausea. Patient not taking: No sig reported 03/10/21   Silverio Decamp, MD  sertraline (ZOLOFT) 25 MG tablet Take 1 tablet (25 mg total) by mouth daily. 11/30/20 12/16/20  Silverio Decamp, MD  topiramate (TOPAMAX) 50 MG tablet TAKE 1 TABLET BY MOUTH TWICE DAILY 11/10/20 11/30/20  Silverio Decamp, MD     Family History  Problem Relation Age of Onset   Cancer Father    Hypertension Father    Cancer Paternal Uncle    Stroke Maternal Grandfather    Hypertension Paternal Grandmother    Cancer Paternal Grandfather    Alzheimer's disease Paternal Grandfather     Social History   Socioeconomic History   Marital status: Single    Spouse name: Not on file   Number of children: Not on file   Years of education: Not on file   Highest education level: Not on file  Occupational History   Not on file  Tobacco Use   Smoking status: Never   Smokeless tobacco: Never  Vaping Use   Vaping Use: Never used  Substance and Sexual Activity   Alcohol use: No   Drug use: No   Sexual activity: Never  Other Topics Concern   Not on file  Social History Narrative   Not on file   Social Determinants of Health   Financial Resource Strain: Not on file  Food Insecurity: Not on file  Transportation Needs: Not on file  Physical Activity: Not on file  Stress: Not on file  Social Connections: Not on file    Review of Systems: A 12 point ROS discussed and pertinent positives are indicated in the HPI above.  All other systems are negative.  Review of Systems  Constitutional:  Negative  for activity change, fatigue, fever and unexpected weight change.  Respiratory:  Negative for cough and shortness of breath.   Cardiovascular:  Negative for chest pain.  Gastrointestinal:  Negative for abdominal pain.  Psychiatric/Behavioral:  Negative for behavioral problems and confusion.    Vital Signs: BP (!) 130/91   Pulse 87   Temp 98.3 F (36.8 C) (Oral)   Ht 5\' 10"  (1.778 m)   Wt 210 lb 1.6 oz (95.3 kg)   SpO2 100%   BMI 30.15 kg/m   Physical Exam HENT:     Mouth/Throat:     Mouth: Mucous membranes are moist.  Cardiovascular:     Rate and Rhythm: Normal rate and regular rhythm.     Heart sounds: Normal heart sounds.  Pulmonary:     Effort: Pulmonary effort is normal.     Breath sounds: Normal breath sounds.  Abdominal:     Palpations: Abdomen is soft.  Tenderness: There is no abdominal tenderness.  Musculoskeletal:        General: No swelling. Normal range of motion.     Right lower leg: Edema present.     Left lower leg: No edema.  Skin:    General: Skin is warm.  Neurological:     Mental Status: He is alert and oriented to person, place, and time.  Psychiatric:        Behavior: Behavior normal.    Imaging: DG Abd 1 View  Result Date: 03/10/2021 CLINICAL DATA:  Left flank pain.  Kidney stone EXAM: ABDOMEN - 1 VIEW COMPARISON:  None. FINDINGS: The bowel gas pattern is normal. No radio-opaque calculi or other significant radiographic abnormality are seen. IMPRESSION: Negative. Electronically Signed   By: Franchot Gallo M.D.   On: 03/10/2021 11:34   CT Chest W Contrast  Result Date: 03/11/2021 CLINICAL DATA:  Chest wall mass seen on CT stone study. EXAM: CT CHEST WITH CONTRAST TECHNIQUE: Multidetector CT imaging of the chest was performed during intravenous contrast administration. CONTRAST:  26mL OMNIPAQUE IOHEXOL 300 MG/ML  SOLN COMPARISON:  CT stone study 03/10/2021 FINDINGS: Cardiovascular: The heart size is normal. No substantial pericardial effusion. No  thoracic aortic aneurysm. Mediastinum/Nodes: No mediastinal lymphadenopathy. There is no hilar lymphadenopathy. The esophagus has normal imaging features. There is no axillary lymphadenopathy. Lungs/Pleura: 4.5 x 6.4 x 4.3 cm mass lesion is identified at the right anterolateral chest wall. This erodes and expands the inferior aspect of the right fifth rib extending into the chest wall between the fifth and sixth ribs. The mass is relatively well-defined with internal calcifications. Irregular periosteal reaction noted in the right fifth rib with apparent Codmans triangle. Lesion extends into the chest wall between the fifth and sixth ribs and appears to involve the serratus anterior muscle with a finger of soft tissue tracking superficial to the right fifth rib cranially. The lesion is contiguous with but does not destroyed the sixth rib. 3 mm left lower lobe nodule identified on image 96/series 3. There is no pleural effusion. Upper Abdomen: Unremarkable. Musculoskeletal: Numerous tiny sclerotic foci are seen in the humeral heads bilaterally, scapula bilaterally, and thoracic spine. IMPRESSION: 1. 4.5 x 6.4 x 4.3 cm mass lesion at the right anterolateral chest wall expands and distorts the anterior inferior aspect of the right fifth rib. There is irregular periosteal reaction in the cortex of the rib with possible Codman's triangle appearance. Imaging features raise concern for neoplasm such as Ewing sarcoma/Askin tumor. Osteosarcoma or metastatic lesion not excluded. Solitary fibrous tumor of the pleura is considered less likely given the apparent chest wall invasion. 2. No definite pulmonary metastases although a 3 mm left lower lobe pulmonary nodule is noted. Attention on follow-up recommended. 3. Numerous tiny sclerotic foci in the humeral heads bilaterally and scapulae. Imaging features are nonspecific but may be related to benign etiology such as bone islands or osteopoikilosis. Electronically Signed   By:  Misty Stanley M.D.   On: 03/11/2021 16:53   US RENAL  Result Date: 03/10/2021 CLINICAL DATA:  35 year old male with left flank pain and hematuria. Query nephrolithiasis. EXAM: RENAL / URINARY TRACT ULTRASOUND COMPLETE COMPARISON:  KUB 1129 hours today. FINDINGS: Right Kidney: Renal measurements: 11.3 x 5.8 x 7.5 cm = volume: 259 mL. Echogenicity within normal limits. No mass or hydronephrosis visualized. Left Kidney: Renal measurements: 12.8 x 8.1 x 7.0 cm = volume: 377 mL. Unilateral left hydronephrosis (image 15). Normal cortical echogenicity and corticomedullary differentiation. No left  renal calculus identified by ultrasound. No left renal cyst or other lesion. Bladder: Appears normal for degree of bladder distention. Neither ureteral jet was detected with Doppler. No urinary debris. Other: None. IMPRESSION: 1. Unilateral left hydronephrosis compatible with acute obstructive uropathy in this clinical setting. No urinary calculus is identified by ultrasound, noncontrast CT Abdomen and Pelvis would best evaluate further. 2. Normal right kidney. Electronically Signed   By: Genevie Ann M.D.   On: 03/10/2021 11:59   CT RENAL STONE STUDY  Result Date: 03/10/2021 CLINICAL DATA:  Left hydronephrosis. EXAM: CT ABDOMEN AND PELVIS WITHOUT CONTRAST TECHNIQUE: Multidetector CT imaging of the abdomen and pelvis was performed following the standard protocol without IV contrast. COMPARISON:  Renal ultrasound from same day FINDINGS: Lower chest: 6.4 x 4.3 cm soft tissue mass which demonstrates internal calcifications/mineralization in the right anterolateral chest wall which appears to be centered at the right anterolateral fifth rib which demonstrates cortical break and aggressive periosteal reaction. The lesion extends into the pleura with effacement of the adjacent lung with associated relaxation atelectasis. No suspicious pulmonary nodules visualized within the lung bases. Normal size heart. No significant pericardial  effusion/thickening. Hepatobiliary: Unremarkable noncontrast appearance of the hepatic parenchyma. Gallbladder is unremarkable. No biliary ductal dilation. Pancreas: Within normal limits. Spleen: Within normal limits. Adrenals/Urinary Tract: Bilateral adrenal glands are unremarkable. Right kidneys unremarkable. Mild left hydroureteronephrosis to the level of a 1-2 mm stone at the left ureterovesicular junction. Urinary bladder is otherwise grossly unremarkable. No contour deforming renal masses. Stomach/Bowel: Stomach is distended with ingested contents otherwise unremarkable. No pathologic dilation of small bowel. The appendix and terminal ileum are grossly unremarkable. Descending and sigmoid colon are predominantly decompressed limiting evaluation. Vascular/Lymphatic: No abdominal aortic aneurysm. No pathologically enlarged abdominal or pelvic lymph nodes. Reproductive: Prostate is unremarkable. Other: No abdominopelvic ascites. 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 IMPRESSION: 1. Mild LEFT hydroureteronephrosis to the level of a 1-2 mm stone at the left ureterovesicular junction. 2. 6.4 x 4.3 cm soft tissue mass in the RIGHT anterolateral chest wall arising from the right anterolateral fifth rib which demonstrates cortical break and aggressive periosteal reaction, and extending into the adjacent pleura. 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 Recommend surgical consult and direct tissue sampling. 3. 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 These results will be called to the ordering clinician or representative by the Radiologist Assistant, and communication documented in the PACS or [employer]. Electronically Signed   By: Dahlia Bailiff MD   On: 03/10/2021 14:16     Labs:  CBC: No results for input(s): WBC, HGB, HCT, PLT in the last 8760 hours.  COAGS: No results for input(s): INR, APTT in the last 8760 hours.  BMP: No results for input(s): NA, K, CL, CO2, GLUCOSE, BUN, CALCIUM, CREATININE, GFRNONAA, GFRAA in the last 8760 hours.  Invalid input(s): CMP  LIVER FUNCTION TESTS: No results for input(s): BILITOT, AST, ALT, ALKPHOS, PROT, ALBUMIN in the last 8760 hours.  TUMOR MARKERS: No results for input(s): AFPTM, CEA, CA199, CHROMGRNA in the last 8760 hours.  Assessment and Plan:  Scheduled for right anterior chest wall mass biopsy Risks and benefits of right anterior chest wall mass biopsy was discussed with the patient and/or patient's family including, but not limited to bleeding, infection, damage to adjacent structures or low yield requiring additional tests.  All of the questions were answered and there is agreement to proceed. Consent signed and in chart.   Thank you for this interesting consult.  I greatly enjoyed meeting [facility] S[facility]TH and look forward to participating in their care.  A copy of this report was sent to the requesting provider on this date.  Electronically Signed: Lavonia Drafts, PA-C 03/16/2021, 9:43 AM   I spent a total of  30 Minutes   in face to face in clinical consultation, greater than 50% of which was counseling/coordinating care for right anterior chest wall mass bx

## 2021-03-16 NOTE — Progress Notes (Signed)
Pt ambulated without difficulty or bleeding.   Discharged home with his mother who will drive and stay with pt x 24 hrs.

## 2021-03-16 NOTE — Procedures (Signed)
Pre procedural Dx: Chest wall mass  Post procedural Dx: Same  Technically successful [place] and CT guided biopsy of indeterminate right anterior chest wall mass   EBL: None.  Complications: None immediate.   Ronny Bacon, MD Pager #: [phone]

## 2021-03-18 ENCOUNTER — Encounter: Payer: Self-pay | Admitting: *Deleted

## 2021-03-18 LAB — SURGICAL PATHOLOGY

## 2021-03-18 NOTE — Progress Notes (Signed)
Reviewed pathology with Dr Marin Olp. Findings show a benign chondroma. Dr Marin Olp states we do not need to bring patient in to be seen.   Patient is encouraged to keep his appointment with thoracic surgery next week. Once the tumor is excised, they will perform additional pathology to ensure benign result. Patient knows that at this time, we will sign off on his case, but that if at any time he needs oncology or hematology care, we would be more than happy to see him.   Oncology Nurse Navigator Documentation  Oncology Nurse Navigator Flowsheets 03/18/2021  Abnormal Finding Date -  Diagnosis Status -  Navigator Follow Up Date: -  Navigator Follow Up Reason: -  Navigation Complete Date: 03/18/2021  Post Navigation: Continue to Follow Patient? No  Reason Not Navigating Patient: No Cancer Diagnosis  Navigator Location [facility]  Referral Date to RadOnc/MedOnc -  Navigator Encounter Type Pathology Review;Telephone  Telephone Diagnostic Results;Outgoing Call  Patient Visit Type MedOnc  Treatment Phase Abnormal Scans  Barriers/Navigation Needs Coordination of Care;Education  Education Other  Interventions Education;Psycho-Social Support  Acuity Level 1-No Barriers  Referrals -  Coordination of Care -  Education Method Verbal  Time Spent with Patient 30

## 2021-03-26 ENCOUNTER — Other Ambulatory Visit: Payer: Self-pay

## 2021-03-26 ENCOUNTER — Encounter: Payer: Self-pay | Admitting: *Deleted

## 2021-03-26 ENCOUNTER — Encounter: Payer: Self-pay | Admitting: Thoracic Surgery (Cardiothoracic Vascular Surgery)

## 2021-03-26 ENCOUNTER — Other Ambulatory Visit: Payer: Self-pay | Admitting: *Deleted

## 2021-03-26 ENCOUNTER — Institutional Professional Consult (permissible substitution): Payer: No Typology Code available for payment source | Admitting: Thoracic Surgery (Cardiothoracic Vascular Surgery)

## 2021-03-26 VITALS — BP 116/79 | HR 90 | Resp 20 | Ht 70.0 in | Wt 223.0 lb

## 2021-03-26 DIAGNOSIS — R222 Localized swelling, mass and lump, trunk: Secondary | ICD-10-CM | POA: Diagnosis not present

## 2021-03-26 NOTE — Progress Notes (Signed)
[address]       [place] [address]             [phone]                    Roy M Villeda [facility] Medical Record #[id number] Date of Birth: 12-25-1985  Referring: Silverio Decamp,* Primary Care: Silverio Decamp, MD Primary Cardiologist: None  Chief Complaint:   No chief complaint on file.   History of Present Illness:    [name] [name] 35 y.o. male presents for evaluation of a right-sided chest wall mass.  This was found incidentally when the patient presented with kidney stones and underwent cross-sectional imaging.  He underwent an image guided biopsy of this lesion which showed benign chondroma.  He denies any symptoms.  He has recently noticed some fullness along his right chest after the biopsy.  His weight has been stable.  He has no neurologic symptoms.      Zubrod Score: At the time of surgery this patient's most appropriate activity status/level should be described as: [x]     0    Normal activity, no symptoms []     1    Restricted in physical strenuous activity but ambulatory, able to do out light work []     2    Ambulatory and capable of self care, unable to do work activities, up and about               >50 % of waking hours                              []     3    Only limited self care, in bed greater than 50% of waking hours []     4    Completely disabled, no self care, confined to bed or chair []     5    Moribund   Past Medical History:  Diagnosis Date   ADD (attention deficit disorder)    Anxiety    Hyperlipidemia    Raynaud disease    Seasonal allergies     Past Surgical History:  Procedure Laterality Date   INNER EAR SURGERY      Family History  Problem Relation Age of Onset   Cancer Father    Hypertension Father    Cancer Paternal Uncle    Stroke Maternal Grandfather    Hypertension Paternal Grandmother    Cancer Paternal Grandfather    Alzheimer's disease Paternal Grandfather      Social History    Tobacco Use  Smoking Status Never  Smokeless Tobacco Never    Social History   Substance and Sexual Activity  Alcohol Use No     No Known Allergies  Current Outpatient Medications  Medication Sig Dispense Refill   methylphenidate (CONCERTA) 18 MG PO CR tablet TAKE 1 TABLET BY MOUTH DAILY IN THE EARLY AFTERNOON (Patient taking differently: Take 18 mg by mouth daily.) 90 tablet 0   niacin 500 MG tablet Take 500 mg by mouth at bedtime.     phentermine (ADIPEX-P) 37.5 MG tablet TAKE 1/2 TABLET (18.75 MG TOTAL) BY MOUTH DAILY BEFORE BREAKFAST. (Patient taking differently: Take 18.75 mg by mouth daily before breakfast.) 45 tablet 1   vortioxetine HBr (TRINTELLIX) 10 MG TABS tablet Take 1 tablet (10 mg total) by mouth daily. 30 tablet 11   No current facility-administered medications for  this visit.    Review of Systems  Constitutional: Negative.   Respiratory: Negative.    Cardiovascular: Negative.   Musculoskeletal: Negative.   Neurological: Negative.     PHYSICAL EXAMINATION: BP 116/79 (BP Location: Left Arm, Patient Position: Sitting)   Pulse 90   Resp 20   Ht 5\' 10"  (1.778 m)   Wt 223 lb (101.2 kg)   SpO2 98% Comment: RA  BMI 32.00 kg/m  Physical Exam Constitutional:      General: He is not in acute distress.    Appearance: Normal appearance. He is not ill-appearing or toxic-appearing.  HENT:     Head: Normocephalic and atraumatic.  Eyes:     Extraocular Movements: Extraocular movements intact.  Cardiovascular:     Rate and Rhythm: Normal rate.  Pulmonary:     Effort: Pulmonary effort is normal. No respiratory distress.  Chest:       Comments: Fullness inferior to the right nipple. Musculoskeletal:        General: Normal range of motion.     Cervical back: Normal range of motion.  Skin:    General: Skin is warm and dry.  Neurological:     General: No focal deficit present.     Mental Status: He is alert and oriented to person, place, and time.     Diagnostic Studies & Laboratory data:     Recent Radiology Findings:   DG Abd 1 View  Result Date: 03/10/2021 CLINICAL DATA:  Left flank pain.  Kidney stone EXAM: ABDOMEN - 1 VIEW COMPARISON:  None. FINDINGS: The bowel gas pattern is normal. No radio-opaque calculi or other significant radiographic abnormality are seen. IMPRESSION: Negative. Electronically Signed   By: Franchot Gallo M.D.   On: 03/10/2021 11:34   CT Chest W Contrast  Result Date: 03/11/2021 CLINICAL DATA:  Chest wall mass seen on CT stone study. EXAM: CT CHEST WITH CONTRAST TECHNIQUE: Multidetector CT imaging of the chest was performed during intravenous contrast administration. CONTRAST:  82mL OMNIPAQUE IOHEXOL 300 MG/ML  SOLN COMPARISON:  CT stone study 03/10/2021 FINDINGS: Cardiovascular: The heart size is normal. No substantial pericardial effusion. No thoracic aortic aneurysm. Mediastinum/Nodes: No mediastinal lymphadenopathy. There is no hilar lymphadenopathy. The esophagus has normal imaging features. There is no axillary lymphadenopathy. Lungs/Pleura: 4.5 x 6.4 x 4.3 cm mass lesion is identified at the right anterolateral chest wall. This erodes and expands the inferior aspect of the right fifth rib extending into the chest wall between the fifth and sixth ribs. The mass is relatively well-defined with internal calcifications. Irregular periosteal reaction noted in the right fifth rib with apparent Codmans triangle. Lesion extends into the chest wall between the fifth and sixth ribs and appears to involve the serratus anterior muscle with a finger of soft tissue tracking superficial to the right fifth rib cranially. The lesion is contiguous with but does not destroyed the sixth rib. 3 mm left lower lobe nodule identified on image 96/series 3. There is no pleural effusion. Upper Abdomen: Unremarkable. Musculoskeletal: Numerous tiny sclerotic foci are seen in the humeral heads bilaterally, scapula bilaterally, and thoracic  spine. IMPRESSION: 1. 4.5 x 6.4 x 4.3 cm mass lesion at the right anterolateral chest wall expands and distorts the anterior inferior aspect of the right fifth rib. There is irregular periosteal reaction in the cortex of the rib with possible Codman's triangle appearance. Imaging features raise concern for neoplasm such as Ewing sarcoma/Askin tumor. Osteosarcoma or metastatic lesion not excluded. Solitary fibrous tumor of the  pleura is considered less likely given the apparent chest wall invasion. 2. No definite pulmonary metastases although a 3 mm left lower lobe pulmonary nodule is noted. Attention on follow-up recommended. 3. Numerous tiny sclerotic foci in the humeral heads bilaterally and scapulae. Imaging features are nonspecific but may be related to benign etiology such as bone islands or osteopoikilosis. Electronically Signed   By: Misty Stanley M.D.   On: 03/11/2021 16:53   US RENAL  Result Date: 03/10/2021 CLINICAL DATA:  35 year old male with left flank pain and hematuria. Query nephrolithiasis. EXAM: RENAL / URINARY TRACT ULTRASOUND COMPLETE COMPARISON:  KUB [id number] hours today. FINDINGS: Right Kidney: Renal measurements: 11.3 x 5.8 x 7.5 cm = volume: 259 mL. Echogenicity within normal limits. No mass or hydronephrosis visualized. Left Kidney: Renal measurements: 12.8 x 8.1 x 7.0 cm = volume: 377 mL. Unilateral left hydronephrosis (image 15). Normal cortical echogenicity and corticomedullary differentiation. No left renal calculus identified by ultrasound. No left renal cyst or other lesion. Bladder: Appears normal for degree of bladder distention. Neither ureteral jet was detected with Doppler. No urinary debris. Other: None. IMPRESSION: 1. Unilateral left hydronephrosis compatible with acute obstructive uropathy in this clinical setting. No urinary calculus is identified by ultrasound, noncontrast CT Abdomen and Pelvis would best evaluate further. 2. Normal right kidney. Electronically Signed   By: Genevie Ann M.D.   On: 03/10/2021 11:59   CT BIOPSY  Result Date: 03/16/2021 INDICATION: No known primary, now with indeterminate expansile mass within the anterior aspect of the right 5th-6th intercostal space, primarily involving the undersurface of the right fifth rib. EXAM: ULTRASOUND AND CT-GUIDED BIOPSY OF INDETERMINATE EXPANSILE MASS INVOLVING THE ANTERIOR ASPECT OF THE RIGHT 5th-6th INTERCOSTAL SPACE, PRIMARILY INVOLVING THE UNDERSURFACE OF THE RIGHT FIFTH RIB COMPARISON:  Chest CT-03/11/2021; CT abdomen and pelvis-03/10/2021 MEDICATIONS: Zofran 4 mg IV ANESTHESIA/SEDATION: Moderate (conscious) sedation was employed during this procedure. A total of Versed 1 mg and Fentanyl 25 mcg was administered intravenously. Moderate Sedation Time: 14 minutes. The patient's level of consciousness and vital signs were monitored continuously by radiology nursing throughout the procedure under my direct supervision. CONTRAST:  None. COMPLICATIONS: None immediate. PROCEDURE: Informed consent was obtained from the patient following an explanation of the procedure, risks, benefits and alternatives. A time out was performed prior to the initiation of the procedure. The patient was positioned supine on the CT table and a limited CT was performed for procedural planning demonstrating unchanged size and appearance of the expansile partially calcified at least 6.2 x 4.3 cm mass involving the anterior aspect of the right 5th - 6th intercostal space (image 22, series 4). The table position was marked and the mass was identified sonographically. The procedure was planned. The operative site was prepped and draped in the usual sterile fashion. Appropriate trajectory was confirmed with a 22 gauge spinal needle after the adjacent tissues were anesthetized with 1% Lidocaine with epinephrine. Under direct ultrasound guidance, a 17 gauge coaxial needle was advanced into the peripheral aspect of the mass. Multiple images were saved procedural  documentation purposes. Appropriate positioning was confirmed with CT imaging. Next, 6 core needle biopsies were obtained with an 18 gauge core needle biopsy device under direct ultrasound guidance. The co-axial needle was removed and hemostasis was achieved with manual compression. A limited postprocedural CT was negative complication, specifically, no evidence pneumothorax or significant peri biopsy hemorrhage. A dressing was applied. The patient tolerated the procedure well without immediate postprocedural complication. IMPRESSION: Technically successful ultrasound and CT  guided core needle biopsy of indeterminate expansile mass involving the anterior aspect of the right 5th - 6th intercostal space, primarily involving the undersurface of the right fifth rib. Electronically Signed   By: Sandi Mariscal M.D.   On: 03/16/2021 14:41   CT RENAL STONE STUDY  Result Date: 03/10/2021 CLINICAL DATA:  Left hydronephrosis. EXAM: CT ABDOMEN AND PELVIS WITHOUT CONTRAST TECHNIQUE: Multidetector CT imaging of the abdomen and pelvis was performed following the standard protocol without IV contrast. COMPARISON:  Renal ultrasound from same day FINDINGS: Lower chest: 6.4 x 4.3 cm soft tissue mass which demonstrates internal calcifications/mineralization in the right anterolateral chest wall which appears to be centered at the right anterolateral fifth rib which demonstrates cortical break and aggressive periosteal reaction. The lesion extends into the pleura with effacement of the adjacent lung with associated relaxation atelectasis. No suspicious pulmonary nodules visualized within the lung bases. Normal size heart. No significant pericardial effusion/thickening. Hepatobiliary: Unremarkable noncontrast appearance of the hepatic parenchyma. Gallbladder is unremarkable. No biliary ductal dilation. Pancreas: Within normal limits. Spleen: Within normal limits. Adrenals/Urinary Tract: Bilateral adrenal glands are unremarkable. Right  kidneys unremarkable. Mild left hydroureteronephrosis to the level of a 1-2 mm stone at the left ureterovesicular junction. Urinary bladder is otherwise grossly unremarkable. No contour deforming renal masses. Stomach/Bowel: Stomach is distended with ingested contents otherwise unremarkable. No pathologic dilation of small bowel. The appendix and terminal ileum are grossly unremarkable. Descending and sigmoid colon are predominantly decompressed limiting evaluation. Vascular/Lymphatic: No abdominal aortic aneurysm. No pathologically enlarged abdominal or pelvic lymph nodes. Reproductive: Prostate is unremarkable. Other: No abdominopelvic ascites. 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 IMPRESSION: 1. Mild LEFT hydroureteronephrosis to the level of a 1-2 mm stone at the left ureterovesicular junction. 2. 6.4 x 4.3 cm soft tissue mass in the RIGHT anterolateral chest wall arising from the right anterolateral fifth rib which demonstrates cortical break and aggressive periosteal reaction, and extending into the adjacent pleura. 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 Recommend surgical consult and direct tissue sampling. 3. 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 These results will be called to the ordering clinician or representative by the Radiologist Assistant, and communication documented in the PACS or [employer]. Electronically Signed   By: Dahlia Bailiff MD   On: 03/10/2021 14:16       I have independently reviewed the above radiology studies  and reviewed the findings with the patient.   Recent Lab Findings: Lab Results  Component Value Date   WBC 9.2 03/16/2021   HGB 17.0 03/16/2021   HCT 50.5 03/16/2021   PLT 334 03/16/2021   GLUCOSE 94 01/15/2020    CHOL 140 01/15/2020   TRIG 138 01/15/2020   HDL 39 (L) 01/15/2020   LDLCALC 77 01/15/2020   ALT 26 01/15/2020   AST 15 01/15/2020   NA 143 01/15/2020   K 4.7 01/15/2020   CL 112 (H) 01/15/2020   CREATININE 1.23 01/15/2020   BUN 12 01/15/2020   CO2 25 01/15/2020   TSH 0.64 01/15/2020   INR 0.9 03/16/2021   HGBA1C 4.9 06/28/2017      Assessment / Plan:   35 year old male with soft tissue mass involving the right chest.  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  On review of the scan it does appear to have some  destruction of the fifth rib.  The sixth rib does not appear involved.  There are some calcifications within the neoplasm which speaks to his chronicity.  We discussed the risks and benefits of surgical excision with mesh reconstruction of his chest wall.  He is agreeable to proceed and is tentatively scheduled for April 06, 2021.     I  spent 40 minutes with  the patient face to face in counseling and coordination of care.    Lajuana Matte 03/26/2021 4:57 PM

## 2021-03-26 NOTE — H&P (View-Only) (Signed)
[address]       [place] [address]             [phone]                    [name] [name] Medical Record #[id number] Date of Birth: May 10, 1986  Referring: Silverio Decamp,* Primary Care: Silverio Decamp, MD Primary Cardiologist: None  Chief Complaint:   No chief complaint on file.   History of Present Illness:    [name] [name] 35 y.o. male presents for evaluation of a right-sided chest wall mass.  This was found incidentally when the patient presented with kidney stones and underwent cross-sectional imaging.  He underwent an image guided biopsy of this lesion which showed benign chondroma.  He denies any symptoms.  He has recently noticed some fullness along his right chest after the biopsy.  His weight has been stable.  He has no neurologic symptoms.      Zubrod Score: At the time of surgery this patient's most appropriate activity status/level should be described as: [x]     0    Normal activity, no symptoms []     1    Restricted in physical strenuous activity but ambulatory, able to do out light work []     2    Ambulatory and capable of self care, unable to do work activities, up and about               >50 % of waking hours                              []     3    Only limited self care, in bed greater than 50% of waking hours []     4    Completely disabled, no self care, confined to bed or chair []     5    Moribund   Past Medical History:  Diagnosis Date   ADD (attention deficit disorder)    Anxiety    Hyperlipidemia    Raynaud disease    Seasonal allergies     Past Surgical History:  Procedure Laterality Date   INNER EAR SURGERY      Family History  Problem Relation Age of Onset   Cancer Father    Hypertension Father    Cancer Paternal Uncle    Stroke Maternal Grandfather    Hypertension Paternal Grandmother    Cancer Paternal Grandfather    Alzheimer's disease Paternal Grandfather      Social History    Tobacco Use  Smoking Status Never  Smokeless Tobacco Never    Social History   Substance and Sexual Activity  Alcohol Use No     No Known Allergies  Current Outpatient Medications  Medication Sig Dispense Refill   methylphenidate (CONCERTA) 18 MG PO CR tablet TAKE 1 TABLET BY MOUTH DAILY IN THE EARLY AFTERNOON (Patient taking differently: Take 18 mg by mouth daily.) 90 tablet 0   niacin 500 MG tablet Take 500 mg by mouth at bedtime.     phentermine (ADIPEX-P) 37.5 MG tablet TAKE 1/2 TABLET (18.75 MG TOTAL) BY MOUTH DAILY BEFORE BREAKFAST. (Patient taking differently: Take 18.75 mg by mouth daily before breakfast.) 45 tablet 1   vortioxetine HBr (TRINTELLIX) 10 MG TABS tablet Take 1 tablet (10 mg total) by mouth daily. 30 tablet 11   No current facility-administered medications for  this visit.    Review of Systems  Constitutional: Negative.   Respiratory: Negative.    Cardiovascular: Negative.   Musculoskeletal: Negative.   Neurological: Negative.     PHYSICAL EXAMINATION: BP 116/79 (BP Location: Left Arm, Patient Position: Sitting)   Pulse 90   Resp 20   Ht 5\' 10"  (1.778 m)   Wt 223 lb (101.2 kg)   SpO2 98% Comment: RA  BMI 32.00 kg/m  Physical Exam Constitutional:      General: He is not in acute distress.    Appearance: Normal appearance. He is not ill-appearing or toxic-appearing.  HENT:     Head: Normocephalic and atraumatic.  Eyes:     Extraocular Movements: Extraocular movements intact.  Cardiovascular:     Rate and Rhythm: Normal rate.  Pulmonary:     Effort: Pulmonary effort is normal. No respiratory distress.  Chest:       Comments: Fullness inferior to the right nipple. Musculoskeletal:        General: Normal range of motion.     Cervical back: Normal range of motion.  Skin:    General: Skin is warm and dry.  Neurological:     General: No focal deficit present.     Mental Status: He is alert and oriented to person, place, and time.     Diagnostic Studies & Laboratory data:     Recent Radiology Findings:   DG Abd 1 View  Result Date: 03/10/2021 CLINICAL DATA:  Left flank pain.  Kidney stone EXAM: ABDOMEN - 1 VIEW COMPARISON:  None. FINDINGS: The bowel gas pattern is normal. No radio-opaque calculi or other significant radiographic abnormality are seen. IMPRESSION: Negative. Electronically Signed   By: Franchot Gallo M.D.   On: 03/10/2021 11:34   CT Chest W Contrast  Result Date: 03/11/2021 CLINICAL DATA:  Chest wall mass seen on CT stone study. EXAM: CT CHEST WITH CONTRAST TECHNIQUE: Multidetector CT imaging of the chest was performed during intravenous contrast administration. CONTRAST:  27mL OMNIPAQUE IOHEXOL 300 MG/ML  SOLN COMPARISON:  CT stone study 03/10/2021 FINDINGS: Cardiovascular: The heart size is normal. No substantial pericardial effusion. No thoracic aortic aneurysm. Mediastinum/Nodes: No mediastinal lymphadenopathy. There is no hilar lymphadenopathy. The esophagus has normal imaging features. There is no axillary lymphadenopathy. Lungs/Pleura: 4.5 x 6.4 x 4.3 cm mass lesion is identified at the right anterolateral chest wall. This erodes and expands the inferior aspect of the right fifth rib extending into the chest wall between the fifth and sixth ribs. The mass is relatively well-defined with internal calcifications. Irregular periosteal reaction noted in the right fifth rib with apparent Codmans triangle. Lesion extends into the chest wall between the fifth and sixth ribs and appears to involve the serratus anterior muscle with a finger of soft tissue tracking superficial to the right fifth rib cranially. The lesion is contiguous with but does not destroyed the sixth rib. 3 mm left lower lobe nodule identified on image 96/series 3. There is no pleural effusion. Upper Abdomen: Unremarkable. Musculoskeletal: Numerous tiny sclerotic foci are seen in the humeral heads bilaterally, scapula bilaterally, and thoracic  spine. IMPRESSION: 1. 4.5 x 6.4 x 4.3 cm mass lesion at the right anterolateral chest wall expands and distorts the anterior inferior aspect of the right fifth rib. There is irregular periosteal reaction in the cortex of the rib with possible Codman's triangle appearance. Imaging features raise concern for neoplasm such as Ewing sarcoma/Askin tumor. Osteosarcoma or metastatic lesion not excluded. Solitary fibrous tumor of the  pleura is considered less likely given the apparent chest wall invasion. 2. No definite pulmonary metastases although a 3 mm left lower lobe pulmonary nodule is noted. Attention on follow-up recommended. 3. Numerous tiny sclerotic foci in the humeral heads bilaterally and scapulae. Imaging features are nonspecific but may be related to benign etiology such as bone islands or osteopoikilosis. Electronically Signed   By: Misty Stanley M.D.   On: 03/11/2021 16:53   US RENAL  Result Date: 03/10/2021 CLINICAL DATA:  35 year old male with left flank pain and hematuria. Query nephrolithiasis. EXAM: RENAL / URINARY TRACT ULTRASOUND COMPLETE COMPARISON:  KUB 1129 hours today. FINDINGS: Right Kidney: Renal measurements: 11.3 x 5.8 x 7.5 cm = volume: 259 mL. Echogenicity within normal limits. No mass or hydronephrosis visualized. Left Kidney: Renal measurements: 12.8 x 8.1 x 7.0 cm = volume: 377 mL. Unilateral left hydronephrosis (image 15). Normal cortical echogenicity and corticomedullary differentiation. No left renal calculus identified by ultrasound. No left renal cyst or other lesion. Bladder: Appears normal for degree of bladder distention. Neither ureteral jet was detected with Doppler. No urinary debris. Other: None. IMPRESSION: 1. Unilateral left hydronephrosis compatible with acute obstructive uropathy in this clinical setting. No urinary calculus is identified by ultrasound, noncontrast CT Abdomen and Pelvis would best evaluate further. 2. Normal right kidney. Electronically Signed   By: Genevie Ann M.D.   On: 03/10/2021 11:59   CT BIOPSY  Result Date: 03/16/2021 INDICATION: No known primary, now with indeterminate expansile mass within the anterior aspect of the right 5th-6th intercostal space, primarily involving the undersurface of the right fifth rib. EXAM: ULTRASOUND AND CT-GUIDED BIOPSY OF INDETERMINATE EXPANSILE MASS INVOLVING THE ANTERIOR ASPECT OF THE RIGHT 5th-6th INTERCOSTAL SPACE, PRIMARILY INVOLVING THE UNDERSURFACE OF THE RIGHT FIFTH RIB COMPARISON:  Chest CT-03/11/2021; CT abdomen and pelvis-03/10/2021 MEDICATIONS: Zofran 4 mg IV ANESTHESIA/SEDATION: Moderate (conscious) sedation was employed during this procedure. A total of Versed 1 mg and Fentanyl 25 mcg was administered intravenously. Moderate Sedation Time: 14 minutes. The patient's level of consciousness and vital signs were monitored continuously by radiology nursing throughout the procedure under my direct supervision. CONTRAST:  None. COMPLICATIONS: None immediate. PROCEDURE: Informed consent was obtained from the patient following an explanation of the procedure, risks, benefits and alternatives. A time out was performed prior to the initiation of the procedure. The patient was positioned supine on the CT table and a limited CT was performed for procedural planning demonstrating unchanged size and appearance of the expansile partially calcified at least 6.2 x 4.3 cm mass involving the anterior aspect of the right 5th - 6th intercostal space (image 22, series 4). The table position was marked and the mass was identified sonographically. The procedure was planned. The operative site was prepped and draped in the usual sterile fashion. Appropriate trajectory was confirmed with a 22 gauge spinal needle after the adjacent tissues were anesthetized with 1% Lidocaine with epinephrine. Under direct ultrasound guidance, a 17 gauge coaxial needle was advanced into the peripheral aspect of the mass. Multiple images were saved procedural  documentation purposes. Appropriate positioning was confirmed with CT imaging. Next, 6 core needle biopsies were obtained with an 18 gauge core needle biopsy device under direct ultrasound guidance. The co-axial needle was removed and hemostasis was achieved with manual compression. A limited postprocedural CT was negative complication, specifically, no evidence pneumothorax or significant peri biopsy hemorrhage. A dressing was applied. The patient tolerated the procedure well without immediate postprocedural complication. IMPRESSION: Technically successful ultrasound and CT  guided core needle biopsy of indeterminate expansile mass involving the anterior aspect of the right 5th - 6th intercostal space, primarily involving the undersurface of the right fifth rib. Electronically Signed   By: Sandi Mariscal M.D.   On: 03/16/2021 14:41   CT RENAL STONE STUDY  Result Date: 03/10/2021 CLINICAL DATA:  Left hydronephrosis. EXAM: CT ABDOMEN AND PELVIS WITHOUT CONTRAST TECHNIQUE: Multidetector CT imaging of the abdomen and pelvis was performed following the standard protocol without IV contrast. COMPARISON:  Renal ultrasound from same day FINDINGS: Lower chest: 6.4 x 4.3 cm soft tissue mass which demonstrates internal calcifications/mineralization in the right anterolateral chest wall which appears to be centered at the right anterolateral fifth rib which demonstrates cortical break and aggressive periosteal reaction. The lesion extends into the pleura with effacement of the adjacent lung with associated relaxation atelectasis. No suspicious pulmonary nodules visualized within the lung bases. Normal size heart. No significant pericardial effusion/thickening. Hepatobiliary: Unremarkable noncontrast appearance of the hepatic parenchyma. Gallbladder is unremarkable. No biliary ductal dilation. Pancreas: Within normal limits. Spleen: Within normal limits. Adrenals/Urinary Tract: Bilateral adrenal glands are unremarkable. Right  kidneys unremarkable. Mild left hydroureteronephrosis to the level of a 1-2 mm stone at the left ureterovesicular junction. Urinary bladder is otherwise grossly unremarkable. No contour deforming renal masses. Stomach/Bowel: Stomach is distended with ingested contents otherwise unremarkable. No pathologic dilation of small bowel. The appendix and terminal ileum are grossly unremarkable. Descending and sigmoid colon are predominantly decompressed limiting evaluation. Vascular/Lymphatic: No abdominal aortic aneurysm. No pathologically enlarged abdominal or pelvic lymph nodes. Reproductive: Prostate is unremarkable. Other: No abdominopelvic ascites. 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 IMPRESSION: 1. Mild LEFT hydroureteronephrosis to the level of a 1-2 mm stone at the left ureterovesicular junction. 2. 6.4 x 4.3 cm soft tissue mass in the RIGHT anterolateral chest wall arising from the right anterolateral fifth rib which demonstrates cortical break and aggressive periosteal reaction, and extending into the adjacent pleura. 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 Recommend surgical consult and direct tissue sampling. 3. 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 These results will be called to the ordering clinician or representative by the Radiologist Assistant, and communication documented in the PACS or [employer]. Electronically Signed   By: Dahlia Bailiff MD   On: 03/10/2021 14:16       I have independently reviewed the above radiology studies  and reviewed the findings with the patient.   Recent Lab Findings: Lab Results  Component Value Date   WBC 9.2 03/16/2021   HGB 17.0 03/16/2021   HCT 50.5 03/16/2021   PLT 334 03/16/2021   GLUCOSE 94 01/15/2020    CHOL 140 01/15/2020   TRIG 138 01/15/2020   HDL 39 (L) 01/15/2020   LDLCALC 77 01/15/2020   ALT 26 01/15/2020   AST 15 01/15/2020   NA 143 01/15/2020   K 4.7 01/15/2020   CL 112 (H) 01/15/2020   CREATININE 1.23 01/15/2020   BUN 12 01/15/2020   CO2 25 01/15/2020   TSH 0.64 01/15/2020   INR 0.9 03/16/2021   HGBA1C 4.9 06/28/2017      Assessment / Plan:   35 year old male with soft tissue mass involving the right chest.  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  On review of the scan it does appear to have some  destruction of the fifth rib.  The sixth rib does not appear involved.  There are some calcifications within the neoplasm which speaks to his chronicity.  We discussed the risks and benefits of surgical excision with mesh reconstruction of his chest wall.  He is agreeable to proceed and is tentatively scheduled for April 06, 2021.     I  spent 40 minutes with  the patient face to face in counseling and coordination of care.    Lajuana Matte 03/26/2021 4:57 PM

## 2021-03-30 ENCOUNTER — Other Ambulatory Visit (HOSPITAL_BASED_OUTPATIENT_CLINIC_OR_DEPARTMENT_OTHER): Payer: Self-pay

## 2021-03-30 ENCOUNTER — Other Ambulatory Visit: Payer: Self-pay | Admitting: Sports Medicine

## 2021-03-30 DIAGNOSIS — I7300 Raynaud's syndrome without gangrene: Secondary | ICD-10-CM

## 2021-03-30 MED ORDER — AMLODIPINE BESYLATE 2.5 MG PO TABS
ORAL_TABLET | Freq: Every day | ORAL | 2 refills | Status: DC
  Filled 2021-03-30: qty 90, 90d supply, fill #0
  Filled 2021-07-07: qty 90, 90d supply, fill #1
  Filled 2021-10-05: qty 90, 90d supply, fill #2

## 2021-04-01 NOTE — Progress Notes (Addendum)
Surgical Instructions    Your procedure is scheduled on 04/06/21.  Report to [facility] Main Entrance "A" at 05:30 A.M., then check in with the Admitting office.  Call this number if you have problems the morning of surgery:  [phone]   If you have any questions prior to your surgery date call [phone]: Open Monday-Friday 8am-4pm    Remember:  Do not eat or drink after midnight the night before your surgery     Take these medicines the morning of surgery with A SIP OF WATER  amLODipine (NORVASC)  vortioxetine HBr (TRINTELLIX)  methylphenidate (CONCERTA)   As of today, STOP taking any Aspirin (unless otherwise instructed by your surgeon) Aleve, Naproxen, Ibuprofen, Motrin, Advil, Goody's, BC's, all herbal medications, fish oil, and all vitamins.          Do not wear jewelry  Do not wear lotions, powders, colognes, or deodorant. Do not shave 48 hours prior to surgery.  Men may shave face and neck. Do not bring valuables to the hospital.             [facility] is not responsible for any belongings or valuables.  Do NOT Smoke (Tobacco/Vaping) or drink Alcohol 24 hours prior to your procedure If you use a CPAP at night, you may bring all equipment for your overnight stay.   Contacts, glasses, dentures or bridgework may not be worn into surgery, please bring cases for these belongings   For patients admitted to the hospital, discharge time will be determined by your treatment team.   Patients discharged the day of surgery will not be allowed to drive home, and someone needs to stay with them for 24 hours.  ONLY 1 SUPPORT PERSON MAY BE PRESENT WHILE YOU ARE IN SURGERY. IF YOU ARE TO BE ADMITTED ONCE YOU ARE IN YOUR ROOM YOU WILL BE ALLOWED TWO (2) VISITORS.  Minor children may have two parents present. Special consideration for safety and communication needs will be reviewed on a case by case basis.  Special instructions:    Oral Hygiene is also important to reduce your  risk of infection.  Remember - BRUSH YOUR TEETH THE MORNING OF SURGERY WITH YOUR REGULAR TOOTHPASTE   [facility]- Preparing For Surgery  Before surgery, you can play an important role. Because skin is not sterile, your skin needs to be as free of germs as possible. You can reduce the number of germs on your skin by washing with CHG (chlorahexidine gluconate) Soap before surgery.  CHG is an antiseptic cleaner which kills germs and bonds with the skin to continue killing germs even after washing.     Please do not use if you have an allergy to CHG or antibacterial soaps. If your skin becomes reddened/irritated stop using the CHG.  Do not shave (including legs and underarms) for at least 48 hours prior to first CHG shower. It is OK to shave your face.  Please follow these instructions carefully.     Shower the NIGHT BEFORE SURGERY and the MORNING OF SURGERY with CHG Soap.   If you chose to wash your hair, wash your hair first as usual with your normal shampoo. After you shampoo, rinse your hair and body thoroughly to remove the shampoo.  Then [employer] and genitals (private parts) with your normal soap and rinse thoroughly to remove soap.  After that Use CHG Soap as you would any other liquid soap. You can apply CHG directly to the skin and wash gently with  a scrungie or a clean washcloth.   Apply the CHG Soap to your body ONLY FROM THE NECK DOWN.  Do not use on open wounds or open sores. Avoid contact with your eyes, ears, mouth and genitals (private parts). Wash Face and genitals (private parts)  with your normal soap.   Wash thoroughly, paying special attention to the area where your surgery will be performed.  Thoroughly rinse your body with warm water from the neck down.  DO NOT shower/wash with your normal soap after using and rinsing off the CHG Soap.  Pat yourself dry with a CLEAN TOWEL.  Wear CLEAN PAJAMAS to bed the night before surgery  Place CLEAN SHEETS on your bed the night  before your surgery  DO NOT SLEEP WITH PETS.   Day of Surgery:  Take a shower with CHG soap. Wear Clean/Comfortable clothing the morning of surgery Do not apply any deodorants/lotions.   Remember to brush your teeth WITH YOUR REGULAR TOOTHPASTE.   Please read over the following fact sheets that you were given.

## 2021-04-02 ENCOUNTER — Other Ambulatory Visit: Payer: Self-pay

## 2021-04-02 ENCOUNTER — Encounter (HOSPITAL_COMMUNITY): Payer: Self-pay

## 2021-04-02 ENCOUNTER — Encounter (HOSPITAL_COMMUNITY)
Admission: RE | Admit: 2021-04-02 | Discharge: 2021-04-02 | Disposition: A | Discharge status: Home / Self Care | Payer: No Typology Code available for payment source | Source: Ambulatory Visit | Attending: Thoracic Surgery (Cardiothoracic Vascular Surgery) | Admitting: Thoracic Surgery (Cardiothoracic Vascular Surgery)

## 2021-04-02 ENCOUNTER — Telehealth: Payer: Self-pay

## 2021-04-02 ENCOUNTER — Ambulatory Visit (HOSPITAL_COMMUNITY)
Admission: RE | Admit: 2021-04-02 | Discharge: 2021-04-02 | Disposition: A | Discharge status: Home / Self Care | Payer: No Typology Code available for payment source | Source: Ambulatory Visit | Attending: Thoracic Surgery (Cardiothoracic Vascular Surgery) | Admitting: Thoracic Surgery (Cardiothoracic Vascular Surgery)

## 2021-04-02 DIAGNOSIS — Z20822 Contact with and (suspected) exposure to covid-19: Secondary | ICD-10-CM | POA: Insufficient documentation

## 2021-04-02 DIAGNOSIS — R222 Localized swelling, mass and lump, trunk: Secondary | ICD-10-CM

## 2021-04-02 DIAGNOSIS — Z01818 Encounter for other preprocedural examination: Secondary | ICD-10-CM

## 2021-04-02 DIAGNOSIS — Z01812 Encounter for preprocedural laboratory examination: Secondary | ICD-10-CM | POA: Insufficient documentation

## 2021-04-02 HISTORY — DX: Cardiac murmur, unspecified: R01.1

## 2021-04-02 HISTORY — DX: Personal history of urinary calculi: Z87.442

## 2021-04-02 LAB — COMPREHENSIVE METABOLIC PANEL
ALT: 65 U/L — ABNORMAL HIGH (ref 0–44)
AST: 31 U/L (ref 15–41)
Albumin: 4.2 g/dL (ref 3.5–5.0)
Alkaline Phosphatase: 90 U/L (ref 38–126)
Anion gap: 8 (ref 5–15)
BUN: 11 mg/dL (ref 6–20)
CO2: 28 mmol/L (ref 22–32)
Calcium: 9.1 mg/dL (ref 8.9–10.3)
Chloride: 104 mmol/L (ref 98–111)
Creatinine, Ser: 1.18 mg/dL (ref 0.61–1.24)
GFR, Estimated: >60 mL/min (ref >60)
Glucose, Bld: 112 mg/dL — ABNORMAL HIGH (ref 70–99)
Potassium: 4.5 mmol/L (ref 3.5–5.1)
Sodium: 140 mmol/L (ref 135–145)
Total Bilirubin: 0.8 mg/dL (ref 0.3–1.2)
Total Protein: 6.7 g/dL (ref 6.5–8.1)

## 2021-04-02 LAB — SURGICAL PCR SCREEN
MRSA, PCR: NEGATIVE
Staphylococcus aureus: NEGATIVE

## 2021-04-02 LAB — CBC
HCT: 49.3 % (ref 39.0–52.0)
Hemoglobin: 16.9 g/dL (ref 13.0–17.0)
MCH: 30.6 pg (ref 26.0–34.0)
MCHC: 34.3 g/dL (ref 30.0–36.0)
MCV: 89.2 fL (ref 80.0–100.0)
Platelets: 312 10*3/uL (ref 150–400)
RBC: 5.53 MIL/uL (ref 4.22–5.81)
RDW: 12.2 % (ref 11.5–15.5)
WBC: 9 10*3/uL (ref 4.0–10.5)
nRBC: 0 % (ref 0.0–0.2)

## 2021-04-02 LAB — BLOOD GAS, ARTERIAL
Acid-Base Excess: 0.6 mmol/L (ref 0.0–2.0)
Bicarbonate: 24.7 mmol/L (ref 20.0–28.0)
Drawn by: 602861
FIO2: 21
O2 Saturation: 98.7 %
Patient temperature: 37
pCO2 arterial: 39.9 mmHg (ref 32.0–48.0)
pH, Arterial: 7.409 (ref 7.350–7.450)
pO2, Arterial: 122 mmHg — ABNORMAL HIGH (ref 83.0–108.0)

## 2021-04-02 LAB — TYPE AND SCREEN
ABO/RH(D): O POS
Antibody Screen: NEGATIVE

## 2021-04-02 LAB — SARS CORONAVIRUS 2 (TAT 6-24 HRS): SARS Coronavirus 2: NEGATIVE

## 2021-04-02 LAB — APTT: aPTT: 27 seconds (ref 24–36)

## 2021-04-02 LAB — PROTIME-INR
INR: 1 (ref 0.8–1.2)
Prothrombin Time: 13 seconds (ref 11.4–15.2)

## 2021-04-02 NOTE — Telephone Encounter (Signed)
FMLA for was completed and faxed to [employer] @ [phone]. Beginning leave 04/06/2021 through 06/07/2021. Forms mailed to home address per pt's request.

## 2021-04-02 NOTE — Progress Notes (Addendum)
PCP - Aundria Mems, MD Cardiologist - denies  PPM/ICD - denies Device Orders - N/A Rep Notified - N/A  Chest x-ray - 04/02/2021 EKG - 04/02/2021 Stress Test - denies ECHO - 05/25/2016 Cardiac Cath - denies  Sleep Study - denies CPAP - N/A  Fasting Blood Sugar - N/A  Blood Thinner Instructions: N/A Aspirin Instructions: Patient was instructed: As of today, STOP taking any Aspirin (unless otherwise instructed by your surgeon) Aleve, Naproxen, Ibuprofen, Motrin, Advil, Goody's, BC's, all herbal medications, fish oil, and all vitamins.  ERAS Protcol - no PRE-SURGERY Ensure or G2- N/A  COVID TEST- 04/02/2021   Anesthesia review: yes; patient verbalized that he has history of heart murmur  Patient denies shortness of breath, fever, cough and chest pain at PAT appointment   All instructions explained to the patient, with a verbal understanding of the material. Patient agrees to go over the instructions while at home for a better understanding. Patient also instructed to self quarantine after being tested for COVID-19. The opportunity to ask questions was provided.

## 2021-04-05 NOTE — Anesthesia Preprocedure Evaluation (Addendum)
Anesthesia Evaluation  Patient identified by MRN, date of birth, ID band Patient awake    Reviewed: Allergy & Precautions, NPO status , Patient's Chart, lab work & pertinent test results  Airway Mallampati: IV  TM Distance: >3 FB Neck ROM: Full    Dental no notable dental hx.    Pulmonary neg pulmonary ROS,    Pulmonary exam normal breath sounds clear to auscultation       Cardiovascular negative cardio ROS Normal cardiovascular exam Rhythm:Regular Rate:Normal     Neuro/Psych PSYCHIATRIC DISORDERS Anxiety negative neurological ROS     GI/Hepatic negative GI ROS, Neg liver ROS,   Endo/Other  negative endocrine ROS  Renal/GU Renal disease     Musculoskeletal negative musculoskeletal ROS (+)   Abdominal (+) + obese,   Peds  Hematology negative hematology ROS (+)   Anesthesia Other Findings CHEST WALL TUMOR  Reproductive/Obstetrics                           Anesthesia Physical Anesthesia Plan  ASA: 2  Anesthesia Plan: General   Post-op Pain Management:    Induction: Intravenous  PONV Risk Score and Plan: 2 and Ondansetron, Dexamethasone, Midazolam and Treatment may vary due to age or medical condition  Airway Management Planned: Oral ETT and Video Laryngoscope Planned  Additional Equipment:   Intra-op Plan:   Post-operative Plan: Extubation in OR  Informed Consent: I have reviewed the patients History and Physical, chart, labs and discussed the procedure including the risks, benefits and alternatives for the proposed anesthesia with the patient or authorized representative who has indicated his/her understanding and acceptance.     Dental advisory given  Plan Discussed with: CRNA  Anesthesia Plan Comments:        Anesthesia Quick Evaluation

## 2021-04-06 ENCOUNTER — Encounter (HOSPITAL_COMMUNITY)
Admission: RE | Disposition: A | Discharge status: Home / Self Care | Payer: Self-pay | Source: Home / Self Care | Attending: Thoracic Surgery (Cardiothoracic Vascular Surgery)

## 2021-04-06 ENCOUNTER — Inpatient Hospital Stay (HOSPITAL_COMMUNITY): Payer: No Typology Code available for payment source | Admitting: Certified Registered Nurse Anesthetist

## 2021-04-06 ENCOUNTER — Other Ambulatory Visit: Payer: Self-pay

## 2021-04-06 ENCOUNTER — Inpatient Hospital Stay (HOSPITAL_COMMUNITY): Payer: No Typology Code available for payment source | Admitting: Vascular Surgery

## 2021-04-06 ENCOUNTER — Inpatient Hospital Stay (HOSPITAL_COMMUNITY)
Admission: RE | Admit: 2021-04-06 | Discharge: 2021-04-09 | DRG: 581 | Disposition: A | Discharge status: Home / Self Care | Payer: No Typology Code available for payment source | Attending: Thoracic Surgery (Cardiothoracic Vascular Surgery) | Admitting: Thoracic Surgery (Cardiothoracic Vascular Surgery)

## 2021-04-06 ENCOUNTER — Encounter (HOSPITAL_COMMUNITY): Payer: Self-pay | Admitting: Thoracic Surgery (Cardiothoracic Vascular Surgery)

## 2021-04-06 ENCOUNTER — Inpatient Hospital Stay (HOSPITAL_COMMUNITY): Payer: No Typology Code available for payment source

## 2021-04-06 DIAGNOSIS — R222 Localized swelling, mass and lump, trunk: Secondary | ICD-10-CM

## 2021-04-06 DIAGNOSIS — F419 Anxiety disorder, unspecified: Secondary | ICD-10-CM | POA: Diagnosis present

## 2021-04-06 DIAGNOSIS — R635 Abnormal weight gain: Secondary | ICD-10-CM

## 2021-04-06 DIAGNOSIS — M62838 Other muscle spasm: Secondary | ICD-10-CM | POA: Diagnosis not present

## 2021-04-06 DIAGNOSIS — J302 Other seasonal allergic rhinitis: Secondary | ICD-10-CM | POA: Diagnosis present

## 2021-04-06 DIAGNOSIS — Z09 Encounter for follow-up examination after completed treatment for conditions other than malignant neoplasm: Secondary | ICD-10-CM

## 2021-04-06 DIAGNOSIS — N200 Calculus of kidney: Secondary | ICD-10-CM | POA: Diagnosis present

## 2021-04-06 DIAGNOSIS — I7300 Raynaud's syndrome without gangrene: Secondary | ICD-10-CM | POA: Diagnosis present

## 2021-04-06 DIAGNOSIS — F988 Other specified behavioral and emotional disorders with onset usually occurring in childhood and adolescence: Secondary | ICD-10-CM | POA: Diagnosis present

## 2021-04-06 DIAGNOSIS — D72828 Other elevated white blood cell count: Secondary | ICD-10-CM | POA: Diagnosis not present

## 2021-04-06 DIAGNOSIS — E785 Hyperlipidemia, unspecified: Secondary | ICD-10-CM | POA: Diagnosis present

## 2021-04-06 DIAGNOSIS — F909 Attention-deficit hyperactivity disorder, unspecified type: Secondary | ICD-10-CM

## 2021-04-06 DIAGNOSIS — Z79899 Other long term (current) drug therapy: Secondary | ICD-10-CM | POA: Diagnosis not present

## 2021-04-06 HISTORY — PX: CHEST WALL RECONSTRUCTION: SHX5103

## 2021-04-06 LAB — ABO/RH: ABO/RH(D): O POS

## 2021-04-06 SURGERY — RECONSTRUCTION, MAJOR, CHEST WALL
Anesthesia: General

## 2021-04-06 MED ORDER — PHENYLEPHRINE HCL (PRESSORS) 10 MG/ML IV SOLN
INTRAVENOUS | Status: DC | PRN
  Administered 2021-04-06 (×2): 80 ug via INTRAVENOUS
  Administered 2021-04-06: 120 ug via INTRAVENOUS

## 2021-04-06 MED ORDER — ENOXAPARIN SODIUM 40 MG/0.4ML IJ SOSY
40.0000 mg | PREFILLED_SYRINGE | Freq: Every day | INTRAMUSCULAR | Status: DC
  Administered 2021-04-07 – 2021-04-09 (×3): 40 mg via SUBCUTANEOUS
  Filled 2021-04-06 (×3): qty 0.4

## 2021-04-06 MED ORDER — PROMETHAZINE HCL 25 MG/ML IJ SOLN
6.2500 mg | INTRAMUSCULAR | Status: DC | PRN
  Administered 2021-04-06: 6.25 mg via INTRAVENOUS

## 2021-04-06 MED ORDER — FENTANYL CITRATE (PF) 250 MCG/5ML IJ SOLN
INTRAMUSCULAR | Status: AC
  Filled 2021-04-06: qty 5

## 2021-04-06 MED ORDER — BISACODYL 5 MG PO TBEC
10.0000 mg | DELAYED_RELEASE_TABLET | Freq: Every day | ORAL | Status: DC
  Administered 2021-04-07 – 2021-04-09 (×2): 10 mg via ORAL
  Filled 2021-04-06 (×2): qty 2

## 2021-04-06 MED ORDER — ONDANSETRON HCL 4 MG/2ML IJ SOLN
INTRAMUSCULAR | Status: AC
  Filled 2021-04-06: qty 2

## 2021-04-06 MED ORDER — VORTIOXETINE HBR 5 MG PO TABS
10.0000 mg | ORAL_TABLET | Freq: Every day | ORAL | Status: DC
  Administered 2021-04-07 – 2021-04-09 (×3): 10 mg via ORAL
  Filled 2021-04-06 (×3): qty 2

## 2021-04-06 MED ORDER — DEXMEDETOMIDINE (PRECEDEX) IN NS 20 MCG/5ML (4 MCG/ML) IV SYRINGE
PREFILLED_SYRINGE | INTRAVENOUS | Status: DC | PRN
  Administered 2021-04-06: 4 ug via INTRAVENOUS
  Administered 2021-04-06: 10 ug via INTRAVENOUS
  Administered 2021-04-06 (×2): 8 ug via INTRAVENOUS

## 2021-04-06 MED ORDER — KETOROLAC TROMETHAMINE 30 MG/ML IJ SOLN
30.0000 mg | Freq: Four times a day (QID) | INTRAMUSCULAR | Status: DC
  Administered 2021-04-06 – 2021-04-08 (×7): 30 mg via INTRAVENOUS
  Filled 2021-04-06 (×7): qty 1

## 2021-04-06 MED ORDER — MORPHINE SULFATE 1 MG/ML IV SOLN PCA
INTRAVENOUS | Status: AC
  Filled 2021-04-06: qty 30

## 2021-04-06 MED ORDER — SUGAMMADEX SODIUM 200 MG/2ML IV SOLN
INTRAVENOUS | Status: DC | PRN
  Administered 2021-04-06: 200 mg via INTRAVENOUS

## 2021-04-06 MED ORDER — PROMETHAZINE HCL 25 MG/ML IJ SOLN
INTRAMUSCULAR | Status: AC
  Filled 2021-04-06: qty 1

## 2021-04-06 MED ORDER — DIPHENHYDRAMINE HCL 12.5 MG/5ML PO ELIX: 12.5000 mg | ORAL_SOLUTION | Freq: Four times a day (QID) | ORAL | Status: DC | PRN

## 2021-04-06 MED ORDER — ORAL CARE MOUTH RINSE: 15.0000 mL | Freq: Once | OROMUCOSAL | Status: AC

## 2021-04-06 MED ORDER — BUPIVACAINE LIPOSOME 1.3 % IJ SUSP
INTRAMUSCULAR | Status: AC
  Filled 2021-04-06: qty 20

## 2021-04-06 MED ORDER — 0.9 % SODIUM CHLORIDE (POUR BTL) OPTIME
TOPICAL | Status: DC | PRN
  Administered 2021-04-06: 3000 mL

## 2021-04-06 MED ORDER — MIDAZOLAM HCL 2 MG/2ML IJ SOLN
INTRAMUSCULAR | Status: AC
  Filled 2021-04-06: qty 2

## 2021-04-06 MED ORDER — ROCURONIUM BROMIDE 100 MG/10ML IV SOLN
INTRAVENOUS | Status: DC | PRN
  Administered 2021-04-06: 60 mg via INTRAVENOUS
  Administered 2021-04-06: 40 mg via INTRAVENOUS

## 2021-04-06 MED ORDER — CEFAZOLIN SODIUM-DEXTROSE 2-4 GM/100ML-% IV SOLN
2.0000 g | INTRAVENOUS | Status: AC
  Administered 2021-04-06: 2 g via INTRAVENOUS
  Filled 2021-04-06: qty 100

## 2021-04-06 MED ORDER — MORPHINE SULFATE 1 MG/ML IV SOLN PCA: INTRAVENOUS | Status: DC

## 2021-04-06 MED ORDER — CEFAZOLIN SODIUM-DEXTROSE 2-4 GM/100ML-% IV SOLN
2.0000 g | Freq: Three times a day (TID) | INTRAVENOUS | Status: AC
  Administered 2021-04-06 (×2): 2 g via INTRAVENOUS
  Filled 2021-04-06 (×2): qty 100

## 2021-04-06 MED ORDER — DEXAMETHASONE SODIUM PHOSPHATE 10 MG/ML IJ SOLN
INTRAMUSCULAR | Status: AC
  Filled 2021-04-06: qty 1

## 2021-04-06 MED ORDER — DEXAMETHASONE SODIUM PHOSPHATE 10 MG/ML IJ SOLN
INTRAMUSCULAR | Status: DC | PRN
  Administered 2021-04-06: 10 mg via INTRAVENOUS

## 2021-04-06 MED ORDER — NALOXONE HCL 0.4 MG/ML IJ SOLN: 0.4000 mg | INTRAMUSCULAR | Status: DC | PRN

## 2021-04-06 MED ORDER — LIDOCAINE HCL (CARDIAC) PF 100 MG/5ML IV SOSY
PREFILLED_SYRINGE | INTRAVENOUS | Status: DC | PRN
  Administered 2021-04-06: 60 mg via INTRAVENOUS

## 2021-04-06 MED ORDER — BUPIVACAINE HCL (PF) 0.5 % IJ SOLN
INTRAMUSCULAR | Status: AC
  Filled 2021-04-06: qty 30

## 2021-04-06 MED ORDER — AMISULPRIDE (ANTIEMETIC) 5 MG/2ML IV SOLN
10.0000 mg | Freq: Once | INTRAVENOUS | Status: DC | PRN
Start: 2021-04-06 — End: 2021-04-06

## 2021-04-06 MED ORDER — SENNOSIDES-DOCUSATE SODIUM 8.6-50 MG PO TABS
1.0000 | ORAL_TABLET | Freq: Every day | ORAL | Status: DC
  Administered 2021-04-06 – 2021-04-08 (×3): 1 via ORAL
  Filled 2021-04-06 (×3): qty 1

## 2021-04-06 MED ORDER — KETOROLAC TROMETHAMINE 30 MG/ML IJ SOLN
INTRAMUSCULAR | Status: DC | PRN
  Administered 2021-04-06: 30 mg via INTRAVENOUS

## 2021-04-06 MED ORDER — METHYLPHENIDATE HCL ER 18 MG PO TB24
18.0000 mg | ORAL_TABLET | Freq: Every day | ORAL | Status: DC
  Administered 2021-04-07 – 2021-04-08 (×2): 18 mg via ORAL
  Filled 2021-04-06 (×3): qty 1

## 2021-04-06 MED ORDER — DIPHENHYDRAMINE HCL 50 MG/ML IJ SOLN
12.5000 mg | Freq: Four times a day (QID) | INTRAMUSCULAR | Status: DC | PRN
Start: 2021-04-06 — End: 2021-04-09

## 2021-04-06 MED ORDER — OXYCODONE HCL 5 MG PO TABS: 5.0000 mg | ORAL_TABLET | Freq: Once | ORAL | Status: DC | PRN

## 2021-04-06 MED ORDER — SODIUM CHLORIDE 0.9% FLUSH: 9.0000 mL | INTRAVENOUS | Status: DC | PRN

## 2021-04-06 MED ORDER — ACETAMINOPHEN 500 MG PO TABS
1000.0000 mg | ORAL_TABLET | Freq: Four times a day (QID) | ORAL | Status: DC
  Administered 2021-04-06 – 2021-04-09 (×12): 1000 mg via ORAL
  Filled 2021-04-06 (×12): qty 2

## 2021-04-06 MED ORDER — LACTATED RINGERS IV SOLN: INTRAVENOUS | Status: DC

## 2021-04-06 MED ORDER — FENTANYL CITRATE (PF) 100 MCG/2ML IJ SOLN
INTRAMUSCULAR | Status: AC
  Filled 2021-04-06: qty 2

## 2021-04-06 MED ORDER — KETOROLAC TROMETHAMINE 30 MG/ML IJ SOLN
INTRAMUSCULAR | Status: AC
  Filled 2021-04-06: qty 1

## 2021-04-06 MED ORDER — TRAMADOL HCL 50 MG PO TABS
50.0000 mg | ORAL_TABLET | Freq: Four times a day (QID) | ORAL | Status: DC | PRN
  Administered 2021-04-07 – 2021-04-09 (×4): 100 mg via ORAL
  Filled 2021-04-06 (×4): qty 2

## 2021-04-06 MED ORDER — PHENTERMINE HCL 37.5 MG PO TABS: 18.7500 mg | ORAL_TABLET | Freq: Every day | ORAL | Status: DC

## 2021-04-06 MED ORDER — LIDOCAINE 2% (20 MG/ML) 5 ML SYRINGE
INTRAMUSCULAR | Status: AC
  Filled 2021-04-06: qty 5

## 2021-04-06 MED ORDER — OXYCODONE HCL 5 MG/5ML PO SOLN: 5.0000 mg | Freq: Once | ORAL | Status: DC | PRN

## 2021-04-06 MED ORDER — MORPHINE SULFATE 1 MG/ML IV SOLN PCA
INTRAVENOUS | Status: DC
Start: 2021-04-06 — End: 2021-04-06
  Filled 2021-04-06: qty 30

## 2021-04-06 MED ORDER — PROPOFOL 10 MG/ML IV BOLUS
INTRAVENOUS | Status: DC | PRN
  Administered 2021-04-06: 200 mg via INTRAVENOUS

## 2021-04-06 MED ORDER — ACETAMINOPHEN 160 MG/5ML PO SOLN: 1000.0000 mg | Freq: Four times a day (QID) | ORAL | Status: DC

## 2021-04-06 MED ORDER — SODIUM CHLORIDE 0.9 % IV SOLN: INTRAVENOUS | Status: DC | PRN

## 2021-04-06 MED ORDER — CHLORHEXIDINE GLUCONATE 0.12 % MT SOLN
15.0000 mL | Freq: Once | OROMUCOSAL | Status: AC
  Administered 2021-04-06: 15 mL via OROMUCOSAL
  Filled 2021-04-06: qty 15

## 2021-04-06 MED ORDER — MIDAZOLAM HCL 2 MG/2ML IJ SOLN
INTRAMUSCULAR | Status: DC | PRN
  Administered 2021-04-06: 2 mg via INTRAVENOUS

## 2021-04-06 MED ORDER — AMLODIPINE BESYLATE 2.5 MG PO TABS
2.5000 mg | ORAL_TABLET | Freq: Every day | ORAL | Status: DC
  Administered 2021-04-07 – 2021-04-09 (×3): 2.5 mg via ORAL
  Filled 2021-04-06 (×3): qty 1

## 2021-04-06 MED ORDER — MORPHINE SULFATE 1 MG/ML IV SOLN PCA
INTRAVENOUS | Status: DC
  Administered 2021-04-06: 39.4 mg via INTRAVENOUS
  Administered 2021-04-07: 1.5 mg via INTRAVENOUS
  Administered 2021-04-07: 20 mg via INTRAVENOUS
  Administered 2021-04-07: 10.5 mg via INTRAVENOUS
  Administered 2021-04-08: 1.5 mg via INTRAVENOUS
  Filled 2021-04-06 (×6): qty 30

## 2021-04-06 MED ORDER — ONDANSETRON HCL 4 MG/2ML IJ SOLN
4.0000 mg | Freq: Four times a day (QID) | INTRAMUSCULAR | Status: DC | PRN
  Administered 2021-04-06 – 2021-04-09 (×8): 4 mg via INTRAVENOUS
  Filled 2021-04-06 (×8): qty 2

## 2021-04-06 MED ORDER — ACETAMINOPHEN 500 MG PO TABS
1000.0000 mg | ORAL_TABLET | Freq: Once | ORAL | Status: AC
  Administered 2021-04-06: 1000 mg via ORAL
  Filled 2021-04-06: qty 2

## 2021-04-06 MED ORDER — ROCURONIUM BROMIDE 10 MG/ML (PF) SYRINGE
PREFILLED_SYRINGE | INTRAVENOUS | Status: AC
  Filled 2021-04-06: qty 10

## 2021-04-06 MED ORDER — FENTANYL CITRATE (PF) 250 MCG/5ML IJ SOLN
INTRAMUSCULAR | Status: DC | PRN
  Administered 2021-04-06: 50 ug via INTRAVENOUS
  Administered 2021-04-06: 150 ug via INTRAVENOUS
  Administered 2021-04-06: 100 ug via INTRAVENOUS
  Administered 2021-04-06: 50 ug via INTRAVENOUS
  Administered 2021-04-06: 100 ug via INTRAVENOUS
  Administered 2021-04-06: 50 ug via INTRAVENOUS

## 2021-04-06 MED ORDER — FENTANYL CITRATE (PF) 100 MCG/2ML IJ SOLN
25.0000 ug | INTRAMUSCULAR | Status: DC | PRN
  Administered 2021-04-06: 50 ug via INTRAVENOUS

## 2021-04-06 MED ORDER — PROPOFOL 10 MG/ML IV BOLUS
INTRAVENOUS | Status: AC
  Filled 2021-04-06: qty 40

## 2021-04-06 MED ORDER — ONDANSETRON HCL 4 MG/2ML IJ SOLN
INTRAMUSCULAR | Status: DC | PRN
  Administered 2021-04-06: 4 mg via INTRAVENOUS

## 2021-04-06 MED ORDER — BUPIVACAINE LIPOSOME 1.3 % IJ SUSP
INTRAMUSCULAR | Status: DC | PRN
  Administered 2021-04-06: 30 mL
  Administered 2021-04-06: 20 mL

## 2021-04-06 MED ORDER — KETOROLAC TROMETHAMINE 30 MG/ML IJ SOLN
30.0000 mg | Freq: Once | INTRAMUSCULAR | Status: DC | PRN
Start: 2021-04-06 — End: 2021-04-06

## 2021-04-06 SURGICAL SUPPLY — 87 items
BATTERY MAXDRIVER (MISCELLANEOUS) ×4 IMPLANT
BLADE CLIPPER SURG (BLADE) ×2 IMPLANT
CANISTER SUCT 3000ML PPV (MISCELLANEOUS) ×2 IMPLANT
CATH KIT ON-Q SILVERSOAK 5IN (CATHETERS) IMPLANT
CATH THORACIC 28FR (CATHETERS) ×2 IMPLANT
CATH THORACIC 36FR (CATHETERS) IMPLANT
CATH THORACIC 36FR RT ANG (CATHETERS) IMPLANT
CHLORAPREP W/TINT 26 (MISCELLANEOUS) ×2 IMPLANT
CLEANER TIP ELECTROSURG 2X2 (MISCELLANEOUS) ×2 IMPLANT
CLIP VESOCCLUDE MED 6/CT (CLIP) IMPLANT
CNTNR URN SCR LID CUP LEK RST (MISCELLANEOUS) ×2 IMPLANT
CONN ST 1/4X3/8  BEN (MISCELLANEOUS)
CONN ST 1/4X3/8 BEN (MISCELLANEOUS) IMPLANT
CONN Y 3/8X3/8X3/8  BEN (MISCELLANEOUS)
CONN Y 3/8X3/8X3/8 BEN (MISCELLANEOUS) IMPLANT
CONNECTOR BLAKE 2:1 CARIO BLK (MISCELLANEOUS) IMPLANT
CONT SPEC 4OZ STRL OR WHT (MISCELLANEOUS) ×2
DERMABOND ADVANCED (GAUZE/BANDAGES/DRESSINGS)
DERMABOND ADVANCED .7 DNX12 (GAUZE/BANDAGES/DRESSINGS) IMPLANT
DEVICE TROCAR PUNCTURE CLOSURE (ENDOMECHANICALS) ×2 IMPLANT
DISSECTOR BLUNT TIP ENDO 5MM (MISCELLANEOUS) IMPLANT
DRAIN CHANNEL 19F RND (DRAIN) ×2 IMPLANT
DRAIN CHANNEL 28F RND 3/8 FF (WOUND CARE) IMPLANT
DRAPE WARM FLUID 44X44 (DRAPES) ×2 IMPLANT
DRSG COVADERM 4X10 (GAUZE/BANDAGES/DRESSINGS) ×2 IMPLANT
ELECT BLADE 4.0 EZ CLEAN MEGAD (MISCELLANEOUS) ×2
ELECT REM PT RETURN 9FT ADLT (ELECTROSURGICAL) ×2
ELECTRODE BLDE 4.0 EZ CLN MEGD (MISCELLANEOUS) ×1 IMPLANT
ELECTRODE REM PT RTRN 9FT ADLT (ELECTROSURGICAL) ×1 IMPLANT
EVACUATOR SILICONE 100CC (DRAIN) ×2 IMPLANT
GAUZE 4X4 16PLY ~~LOC~~+RFID DBL (SPONGE) ×2 IMPLANT
GAUZE SPONGE 4X4 12PLY STRL (GAUZE/BANDAGES/DRESSINGS) ×2 IMPLANT
GLOVE SURG ENC TEXT LTX SZ7 (GLOVE) ×2 IMPLANT
GLOVE SURG ENC TEXT LTX SZ7.5 (GLOVE) ×2 IMPLANT
GLOVE SURG MICRO LTX SZ7 (GLOVE) ×2 IMPLANT
GLOVE SURG UNDER POLY LF SZ6 (GLOVE) ×2 IMPLANT
GOWN STRL REUS W/ TWL LRG LVL3 (GOWN DISPOSABLE) ×3 IMPLANT
GOWN STRL REUS W/ TWL XL LVL3 (GOWN DISPOSABLE) ×1 IMPLANT
GOWN STRL REUS W/TWL LRG LVL3 (GOWN DISPOSABLE) ×3
GOWN STRL REUS W/TWL XL LVL3 (GOWN DISPOSABLE) ×1
GRASPER SUT TROCAR 14GX15 (MISCELLANEOUS) ×2 IMPLANT
KIT BASIN OR (CUSTOM PROCEDURE TRAY) ×2 IMPLANT
KIT SUCTION CATH 14FR (SUCTIONS) ×4 IMPLANT
KIT TURNOVER KIT B (KITS) ×2 IMPLANT
MESH VENTRALIGHT ST 8X10 (Mesh General) ×2 IMPLANT
NEEDLE 1/2 CIR CATGUT .05X1.09 (NEEDLE) ×2 IMPLANT
NEEDLE SPNL 18GX3.5 QUINCKE PK (NEEDLE) ×2 IMPLANT
NS IRRIG 1000ML POUR BTL (IV SOLUTION) ×8 IMPLANT
PACK CHEST (CUSTOM PROCEDURE TRAY) ×2 IMPLANT
PACK UNIVERSAL I (CUSTOM PROCEDURE TRAY) ×2 IMPLANT
PAD ARMBOARD 7.5X6 YLW CONV (MISCELLANEOUS) ×4 IMPLANT
PASSER SUT SWANSON 36MM LOOP (INSTRUMENTS) ×2 IMPLANT
SEALANT SURG COSEAL 8ML (VASCULAR PRODUCTS) IMPLANT
SUT MNCRL AB 3-0 PS2 18 (SUTURE) ×2 IMPLANT
SUT PDS AB 1 CTX 36 (SUTURE) ×2 IMPLANT
SUT PROLENE 2 0 MH 48 (SUTURE) ×8 IMPLANT
SUT PROLENE 3 0 SH DA (SUTURE) IMPLANT
SUT PROLENE 4 0 RB 1 (SUTURE)
SUT PROLENE 4-0 RB1 .5 CRCL 36 (SUTURE) IMPLANT
SUT SILK  1 MH (SUTURE) ×6
SUT SILK 1 MH (SUTURE) ×6 IMPLANT
SUT SILK 1 TIES 10X30 (SUTURE) IMPLANT
SUT SILK 2 0 SH CR/8 (SUTURE) ×2 IMPLANT
SUT SILK 2 0SH CR/8 30 (SUTURE) IMPLANT
SUT SILK 3 0SH CR/8 30 (SUTURE) IMPLANT
SUT VIC AB 1 CTX 18 (SUTURE) IMPLANT
SUT VIC AB 1 CTX 36 (SUTURE)
SUT VIC AB 1 CTX36XBRD ANBCTR (SUTURE) IMPLANT
SUT VIC AB 2-0 CT1 27 (SUTURE) ×1
SUT VIC AB 2-0 CT1 TAPERPNT 27 (SUTURE) ×1 IMPLANT
SUT VIC AB 2-0 CTX 36 (SUTURE) ×4 IMPLANT
SUT VIC AB 3-0 X1 27 (SUTURE) IMPLANT
SUT VICRYL 0 UR6 27IN ABS (SUTURE) IMPLANT
SUT VICRYL 2 TP 1 (SUTURE) ×8 IMPLANT
SWAB COLLECTION DEVICE MRSA (MISCELLANEOUS) IMPLANT
SWAB CULTURE ESWAB REG 1ML (MISCELLANEOUS) IMPLANT
SYR CONTROL 10ML LL (SYRINGE) ×2 IMPLANT
SYSTEM SAHARA CHEST DRAIN ATS (WOUND CARE) ×2 IMPLANT
TAPE CLOTH 4X10 WHT NS (GAUZE/BANDAGES/DRESSINGS) ×2 IMPLANT
TAPE CLOTH SURG 4X10 WHT LF (GAUZE/BANDAGES/DRESSINGS) ×2 IMPLANT
TIP APPLICATOR SPRAY EXTEND 16 (VASCULAR PRODUCTS) IMPLANT
TOWEL GREEN STERILE (TOWEL DISPOSABLE) ×2 IMPLANT
TOWEL GREEN STERILE FF (TOWEL DISPOSABLE) ×2 IMPLANT
TRAP SPECIMEN MUCUS 40CC (MISCELLANEOUS) IMPLANT
TRAY FOLEY SLVR 16FR LF STAT (SET/KITS/TRAYS/PACK) ×2 IMPLANT
TROCAR XCEL 12X100 BLDLESS (ENDOMECHANICALS) ×2 IMPLANT
WATER STERILE IRR 1000ML POUR (IV SOLUTION) ×4 IMPLANT

## 2021-04-06 NOTE — Anesthesia Procedure Notes (Signed)
Procedure Name: Intubation Date/Time: 04/06/2021 7:46 AM Performed by: Lance Coon, CRNA Pre-anesthesia Checklist: Patient identified, Emergency Drugs available, Suction available, Patient being monitored and Timeout performed Patient Re-evaluated:Patient Re-evaluated prior to induction Oxygen Delivery Method: Circle system utilized Preoxygenation: Pre-oxygenation with 100% oxygen Induction Type: IV induction Ventilation: Mask ventilation without difficulty Laryngoscope Size: Glidescope and 4 Grade View: Grade I Tube type: Oral Tube size: 8.0 mm Number of attempts: 1 Airway Equipment and Method: Stylet and Video-laryngoscopy Placement Confirmation: ETT inserted through vocal cords under direct vision, positive ETCO2 and breath sounds checked- equal and bilateral Secured at: 23 cm Tube secured with: Tape Dental Injury: Teeth and Oropharynx as per pre-operative assessment

## 2021-04-06 NOTE — Plan of Care (Signed)

## 2021-04-06 NOTE — Transfer of Care (Signed)
Immediate Anesthesia Transfer of Care Note  Patient: [name] [name]  Procedure(s) Performed: CHEST WALL RESECTION WITH MESH RECONSTRUCTION  Patient Location: PACU  Anesthesia Type:General  Level of Consciousness: drowsy and patient cooperative  Airway & Oxygen Therapy: Patient Spontanous Breathing  Post-op Assessment: Report given to RN and Post -op Vital signs reviewed and stable  Post vital signs: Reviewed and stable  Last Vitals:  Vitals Value Taken Time  BP 125/67 04/06/21 1104  Temp    Pulse 94 04/06/21 1106  Resp 14 04/06/21 1106  SpO2 95 % 04/06/21 1106  Vitals shown include unvalidated device data.  Last Pain:  Vitals:   04/06/21 0610  TempSrc:   PainSc: 0-No pain         Complications: No notable events documented.

## 2021-04-06 NOTE — Op Note (Signed)
     [address]       [place] [address]             [phone]       04/06/2021 Patient:  [name] [name] Pre-Op Dx: Chest wall mass Post-op Dx: Same Procedure: Chest wall mass excision Chest wall reconstruction with 8 x 10 inch Bard bioabsorbable permanent mesh Intercostal nerve block  Surgeon and Role:      * Heaven Meeker, Lucile Crater, MD - Primary    Evonnie Pat, PA-C- assisting   Anesthesia  general EBL: 100 ml Blood Administration: None Specimen: Chest wall mass  Drains: 28 F argyle chest tube in right chest.  19 [place] Blake above the mesh reconstruction and soft tissue Counts: correct    Indications: 35 year old male with soft tissue mass involving the right chest.  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  On review of the scan it does appear to have some destruction of the fifth rib.  The sixth rib does not appear involved.  There are some calcifications within the neoplasm which speaks to his chronicity.  We discussed the risks and benefits of surgical excision with mesh reconstruction of his chest wall.  He is agreeable to proceed.  Findings: Firm soft tissue mass was identified upon entry into the pleural space.  And was involving to ribs.  Good coverage of the defect with the permanent mesh.  The defect measured 12 x 12 cm.  Operative Technique: After the risks, benefits and alternatives were thoroughly discussed, the patient was brought to the operative theatre.  Anesthesia was induced, the patient was then placed in a left lazy lateral decubitus position and was prepped and draped in normal sterile fashion.  An appropriate surgical pause was performed, and pre-operative antibiotics were dosed accordingly.  We made a 6 cm incision above the sixth rib and carried this down to the level of the muscle using Bovie cautery.  We then created skin flaps to aid in our exposure.  We entered the pleural space above the sixth rib and it was  evident that the mass was involving this rib as well is the fifth rib.  RN incision was extended to 10 cm to allow for adequate excision.  The fifth and sixth ribs were skeletonized medially and laterally and then divided with a bone cutter.  The intercostal vascular bundle was controlled with electrocautery.  We excised the mass along with the 2 segments of ribs in its entirety.  The defect measured 12 x 12 cm, and we sized a permanent mesh to fill in the space.  Using 2-0 Prolene sutures the mesh was tacked through the ribs after holes were drilled in them.  The mesh was secured as an underlay.   An intercostal nerve block was performed under direct visualization.  A 36F chest tube was then placed in the pleural space under the mesh, and a19F blake was place above the mesh. The skin and soft tissue were closed with absorbable suture    The patient tolerated the procedure without any immediate complications, and was transferred to the PACU in stable condition.  Deana Krock Bary Leriche

## 2021-04-06 NOTE — Plan of Care (Signed)
  Problem: Education: Goal: Knowledge of General Education information will improve Description: Including pain rating scale, medication(s)/side effects and non-pharmacologic comfort measures Outcome: Progressing   Problem: Health Behavior/Discharge Planning: Goal: Ability to manage health-related needs will improve Outcome: Progressing   Problem: Clinical Measurements: Goal: Ability to maintain clinical measurements within normal limits will improve Outcome: Progressing Goal: Respiratory complications will improve Outcome: Progressing Goal: Cardiovascular complication will be avoided Outcome: Progressing   Problem: Nutrition: Goal: Adequate nutrition will be maintained Outcome: Progressing   Problem: Coping: Goal: Level of anxiety will decrease Outcome: Progressing

## 2021-04-06 NOTE — Interval H&P Note (Signed)
History and Physical Interval Note:  04/06/2021 7:37 AM  [name] [name]  has presented today for surgery, with the diagnosis of CHEST WALL TUMOR.  The various methods of treatment have been discussed with the patient and family. After consideration of risks, benefits and other options for treatment, the patient has consented to  Procedure(s): CHEST WALL RESECTION WITH MESH RECONSTRUCTION (N/A) as a surgical intervention.  The patient's history has been reviewed, patient examined, no change in status, stable for surgery.  I have reviewed the patient's chart and labs.  Questions were answered to the patient's satisfaction.     Ameka Krigbaum Bary Leriche

## 2021-04-06 NOTE — Brief Op Note (Signed)
04/06/2021  10:33 AM  PATIENT:  [name] [name]  35 y.o. male  PRE-OPERATIVE DIAGNOSIS:  CHEST WALL TUMOR  POST-OPERATIVE DIAGNOSIS:  CHEST WALL TUMOR  PROCEDURE:  Procedure(s): CHEST WALL RESECTION WITH MESH RECONSTRUCTION (N/A)  SURGEON:  Surgeon(s) and Role:    * Lightfoot, Lucile Crater, MD - Primary  PHYSICIAN ASSISTANT: Idaly Verret PA-C  ASSISTANTS: none   ANESTHESIA:   general  EBL:  100 mL   BLOOD ADMINISTERED:none  DRAINS:  BLAKE DRAIN OVER MESH, 0NE PLEURAL CHEST TUBE    LOCAL MEDICATIONS USED:  OTHER EXPAREL NERVE BLOCK, INTERCOSTAL  SPECIMEN:  Source of Specimen:  CHEST WALL RESECTION  DISPOSITION OF SPECIMEN:  PATHOLOGY  COUNTS:  YES  TOURNIQUET:  * No tourniquets in log *  DICTATION: .Dragon Dictation  PLAN OF CARE: Admit to inpatient   PATIENT DISPOSITION:  PACU - hemodynamically stable.   Delay start of Pharmacological VTE agent (>24hrs) due to surgical blood loss or risk of bleeding: NO  COMPLICATIONS: NO KNOWN

## 2021-04-07 ENCOUNTER — Inpatient Hospital Stay (HOSPITAL_COMMUNITY): Payer: No Typology Code available for payment source

## 2021-04-07 ENCOUNTER — Encounter (HOSPITAL_COMMUNITY): Payer: Self-pay | Admitting: Thoracic Surgery (Cardiothoracic Vascular Surgery)

## 2021-04-07 LAB — BASIC METABOLIC PANEL
Anion gap: 9 (ref 5–15)
BUN: 13 mg/dL (ref 6–20)
CO2: 27 mmol/L (ref 22–32)
Calcium: 9 mg/dL (ref 8.9–10.3)
Chloride: 100 mmol/L (ref 98–111)
Creatinine, Ser: 0.98 mg/dL (ref 0.61–1.24)
GFR, Estimated: >60 mL/min (ref >60)
Glucose, Bld: 127 mg/dL — ABNORMAL HIGH (ref 70–99)
Potassium: 4.8 mmol/L (ref 3.5–5.1)
Sodium: 136 mmol/L (ref 135–145)

## 2021-04-07 LAB — CBC
HCT: 44.4 % (ref 39.0–52.0)
Hemoglobin: 14.8 g/dL (ref 13.0–17.0)
MCH: 30 pg (ref 26.0–34.0)
MCHC: 33.3 g/dL (ref 30.0–36.0)
MCV: 89.9 fL (ref 80.0–100.0)
Platelets: 286 10*3/uL (ref 150–400)
RBC: 4.94 MIL/uL (ref 4.22–5.81)
RDW: 12.3 % (ref 11.5–15.5)
WBC: 21.6 10*3/uL — ABNORMAL HIGH (ref 4.0–10.5)
nRBC: 0 % (ref 0.0–0.2)

## 2021-04-07 MED ORDER — CYCLOBENZAPRINE HCL 10 MG PO TABS
5.0000 mg | ORAL_TABLET | Freq: Once | ORAL | Status: AC
  Administered 2021-04-07: 5 mg via ORAL
  Filled 2021-04-07: qty 1

## 2021-04-07 NOTE — Discharge Instructions (Signed)

## 2021-04-07 NOTE — Anesthesia Postprocedure Evaluation (Signed)
Anesthesia Post Note  Patient: [name] [name]  Procedure(s) Performed: CHEST WALL RESECTION WITH MESH RECONSTRUCTION     Patient location during evaluation: PACU Anesthesia Type: General Level of consciousness: awake Pain management: pain level controlled Vital Signs Assessment: post-procedure vital signs reviewed and stable Respiratory status: spontaneous breathing, nonlabored ventilation, respiratory function stable and patient connected to nasal cannula oxygen Cardiovascular status: blood pressure returned to baseline and stable Postop Assessment: no apparent nausea or vomiting Anesthetic complications: no   No notable events documented.  Last Vitals:  Vitals:   04/07/21 0600 04/07/21 0627  BP: (!) 114/56   Pulse: 78   Resp: 13 14  Temp:    SpO2: 97% 98%    Last Pain:  Vitals:   04/07/21 0627  TempSrc:   PainSc: 2                  Shawonda Kerce P Matti Killingsworth

## 2021-04-07 NOTE — Plan of Care (Signed)

## 2021-04-07 NOTE — Hospital Course (Signed)
    History of Present Illness:    [name] [name] 35 y.o. male presents for evaluation of a right-sided chest wall mass.  This was found incidentally when the patient presented with kidney stones and underwent cross-sectional imaging.  He underwent an image guided biopsy of this lesion which showed benign chondroma.  He denies any symptoms.  He has recently noticed some fullness along his right chest after the biopsy.  His weight has been stable.  He has no neurologic symptoms.  After full discussion with the patient and review of all relevant studies it was agreed that the patient should undergo resection of this mass.  He was admitted electively for the procedure.  Hospital course  The patient was taken the operating room on 04/06/2021 at which time he underwent the following procedure: Chest wall mass excision Chest wall reconstruction with 8 x 10 inch Bard bioabsorbable permanent mesh Intercostal nerve block.  The patient tolerated the procedure well and was taken to the postanesthesia care unit in stable condition.  Postoperative hospital course:  The patient has done well.  Pain has managed adequately Early with PCA.  Chest tube drainage has been relatively low and tube was removed on postoperative day #1.  He additionally does have a JP drain in the region of the reconstruction which will be kept in place at time of discharge.  Renal function is noted to be in the normal range as is hemoglobin and hematocrit.  He is tolerating a gradual increase in activity using standard postoperative protocols.  Incisions noted to be healing well without evidence of infection.  He is tolerating diet.

## 2021-04-07 NOTE — Discharge Summary (Addendum)
Physician Discharge Summary  Patient ID: [name] [name] MRN: [id number] DOB/AGE: [birth year] 35 y.o.  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 male presents for evaluation of a right-sided chest wall mass.  This was found incidentally when the patient presented with kidney stones and underwent cross-sectional imaging.  He underwent an image guided biopsy of this lesion which showed benign chondroma.  He denies any symptoms.  He has recently noticed some fullness along his right chest after the biopsy.  His weight has been stable.  He has no neurologic symptoms.  After full discussion with the patient and review of all relevant studies it was agreed that the patient should undergo resection of this mass.  He was admitted electively for the procedure.  Hospital course  The patient was taken the operating room on 04/06/2021 at which time he underwent the following procedure: Chest wall mass excision Chest wall reconstruction with 8 x 10 inch Bard bioabsorbable permanent mesh Intercostal nerve block.  The patient tolerated the procedure well and was taken to the postanesthesia care unit in stable condition.  Postoperative hospital course:  The patient has done well.  Pain has  managed adequately Early with PCA.  Chest tube drainage has been relatively low and tube was removed on postoperative day #1.  Renal function is noted to be in the normal range as is hemoglobin and hematocrit.  He is tolerating a gradual increase in activity using standard postoperative protocols.  Incisions noted to be healing well without evidence of infection.  He is tolerating diet.  Pain management has been converted to oral regimen which is adequate.  He is tolerating increasing activities using standard postoperative protocols.  Oxygen was weaned and he maintains good saturations on room air.  At the time of discharge the patient was felt to be quite stable.  Discharged Condition: good  Consults: None  Significant Diagnostic Studies: DG Chest 2 View  Result Date: 04/03/2021 CLINICAL DATA:  Pre-admission chest x-ray or chest wall resection with mesh reconstruction. Chest wall mass. EXAM: CHEST - 2 VIEW COMPARISON:  CT scan of the chest March 11, 2021 FINDINGS: The known chest wall mass in the anterolateral right chest is again identified. The heart, hila, and mediastinum are unremarkable. No pneumothorax. No other nodules or masses. No suspicious infiltrates. Multiple tiny sclerotic foci are seen in the bilateral humeral heads, better assessed on previous CT imaging. IMPRESSION: 1. The known right chest wall mass is again identified without obvious change. 2. Numerous sclerotic foci in the bilateral humeral heads, better assessed on previous CT imaging. 3. No acute abnormalities. Electronically Signed   By: Dorise Bullion III M.D   On: 04/03/2021 15:45   CT Chest W Contrast  Result Date: 03/11/2021 CLINICAL DATA:  Chest  wall mass seen on CT stone study. EXAM: CT CHEST WITH CONTRAST TECHNIQUE: Multidetector CT imaging of the chest was performed during intravenous contrast administration. CONTRAST:  1mL OMNIPAQUE IOHEXOL 300 MG/ML  SOLN COMPARISON:  CT stone study 03/10/2021 FINDINGS:  Cardiovascular: The heart size is normal. No substantial pericardial effusion. No thoracic aortic aneurysm. Mediastinum/Nodes: No mediastinal lymphadenopathy. There is no hilar lymphadenopathy. The esophagus has normal imaging features. There is no axillary lymphadenopathy. Lungs/Pleura: 4.5 x 6.4 x 4.3 cm mass lesion is identified at the right anterolateral chest wall. This erodes and expands the inferior aspect of the right fifth rib extending into the chest wall between the fifth and sixth ribs. The mass is relatively well-defined with internal calcifications. Irregular periosteal reaction noted in the right fifth rib with apparent Codmans triangle. Lesion extends into the chest wall between the fifth and sixth ribs and appears to involve the serratus anterior muscle with a finger of soft tissue tracking superficial to the right fifth rib cranially. The lesion is contiguous with but does not destroyed the sixth rib. 3 mm left lower lobe nodule identified on image 96/series 3. There is no pleural effusion. Upper Abdomen: Unremarkable. Musculoskeletal: Numerous tiny sclerotic foci are seen in the humeral heads bilaterally, scapula bilaterally, and thoracic spine. IMPRESSION: 1. 4.5 x 6.4 x 4.3 cm mass lesion at the right anterolateral chest wall expands and distorts the anterior inferior aspect of the right fifth rib. There is irregular periosteal reaction in the cortex of the rib with possible Codman's triangle appearance. Imaging features raise concern for neoplasm such as Ewing sarcoma/Askin tumor. Osteosarcoma or metastatic lesion not excluded. Solitary fibrous tumor of the pleura is considered less likely given the apparent chest wall invasion. 2. No definite pulmonary metastases although a 3 mm left lower lobe pulmonary nodule is noted. Attention on follow-up recommended. 3. Numerous tiny sclerotic foci in the humeral heads bilaterally and scapulae. Imaging features are nonspecific but may be related to  benign etiology such as bone islands or osteopoikilosis. Electronically Signed   By: Misty Stanley M.D.   On: 03/11/2021 16:53   CT BIOPSY  Result Date: 03/16/2021 INDICATION: No known primary, now with indeterminate expansile mass within the anterior aspect of the right 5th-6th intercostal space, primarily involving the undersurface of the right fifth rib. EXAM: ULTRASOUND AND CT-GUIDED BIOPSY OF INDETERMINATE EXPANSILE MASS INVOLVING THE ANTERIOR ASPECT OF THE RIGHT 5th-6th INTERCOSTAL SPACE, PRIMARILY INVOLVING THE UNDERSURFACE OF THE RIGHT FIFTH RIB COMPARISON:  Chest CT-03/11/2021; CT abdomen and pelvis-03/10/2021 MEDICATIONS: Zofran 4 mg IV ANESTHESIA/SEDATION: Moderate (conscious) sedation was employed during this procedure. A total of Versed 1 mg and Fentanyl 25 mcg was administered intravenously. Moderate Sedation Time: 14 minutes. The patient's level of consciousness and vital signs were monitored continuously by radiology nursing throughout the procedure under my direct supervision. CONTRAST:  None. COMPLICATIONS: None immediate. PROCEDURE: Informed consent was obtained from the patient following an explanation of the procedure, risks, benefits and alternatives. A time out was performed prior to the initiation of the procedure. The patient was positioned supine on the CT table and a limited CT was performed for procedural planning demonstrating unchanged size and appearance of the expansile partially calcified at least 6.2 x 4.3 cm mass involving the anterior aspect of the right 5th - 6th intercostal space (image 22, series 4). The table position was marked and the mass was identified sonographically. The procedure was planned. The operative site was prepped and draped in the usual sterile fashion. Appropriate  trajectory was confirmed with a 22 gauge spinal needle after the adjacent tissues were anesthetized with 1% Lidocaine with epinephrine. Under direct ultrasound guidance, a 17 gauge coaxial  needle was advanced into the peripheral aspect of the mass. Multiple images were saved procedural documentation purposes. Appropriate positioning was confirmed with CT imaging. Next, 6 core needle biopsies were obtained with an 18 gauge core needle biopsy device under direct ultrasound guidance. The co-axial needle was removed and hemostasis was achieved with manual compression. A limited postprocedural CT was negative complication, specifically, no evidence pneumothorax or significant peri biopsy hemorrhage. A dressing was applied. The patient tolerated the procedure well without immediate postprocedural complication. IMPRESSION: Technically successful ultrasound and CT guided core needle biopsy of indeterminate expansile mass involving the anterior aspect of the right 5th - 6th intercostal space, primarily involving the undersurface of the right fifth rib. Electronically Signed   By: Sandi Mariscal M.D.   On: 03/16/2021 14:41   DG CHEST PORT 1 VIEW  Result Date: 04/08/2021 CLINICAL DATA:  Chest tube removal EXAM: PORTABLE CHEST 1 VIEW COMPARISON:  04/07/2021 FINDINGS: Interval removal of a right-sided chest tube. There is no significant pneumothorax, however there has been accumulation of a small to moderate right pleural effusion with associated atelectasis or consolidation. The left lung is normally aerated. Mild cardiomegaly IMPRESSION: Interval removal of right-sided chest tube. There is no significant pneumothorax, however there has been accumulation of a small to moderate right pleural effusion with associated atelectasis or consolidation. Electronically Signed   By: Eddie Candle M.D.   On: 04/08/2021 09:24   DG CHEST PORT 1 VIEW  Result Date: 04/07/2021 CLINICAL DATA:  Chest tube post lung surgery, sore chest EXAM: PORTABLE CHEST 1 VIEW COMPARISON:  Portable exam 0624 hours compared to 04/06/2021, 04/02/2021 FINDINGS: RIGHT thoracostomy tube unchanged. Suspected surgical drain overlies the inferior  lateral RIGHT chest wall. Enlargement of cardiac silhouette. Mediastinal contours and pulmonary vascularity normal. Bibasilar atelectasis. Lungs otherwise clear. No acute infiltrate, pleural effusion, or pneumothorax. No acute osseous findings. IMPRESSION: RIGHT thoracostomy tube without pneumothorax. Enlargement of cardiac silhouette with bibasilar atelectasis. Resection of inferior chest wall mass identified on preoperative exam. Electronically Signed   By: Lavonia Dana M.D.   On: 04/07/2021 09:13   DG Chest Port 1 View  Result Date: 04/06/2021 CLINICAL DATA:  Follow-up chest wall surgery. EXAM: PORTABLE CHEST 1 VIEW COMPARISON:  04/02/2021 FINDINGS: Thoracostomy tube in place on the right. No pneumothorax. Mass density previously seen in the right lower chest is absent. The lungs are clear except for minimal atelectasis at the right base. IMPRESSION: Status post resection of a right lower chest wall mass. No visible pneumothorax. Minimal atelectasis at the right base. Electronically Signed   By: Nelson Chimes M.D.   On: 04/06/2021 14:06     Treatments:   04/06/2021 Patient:  [name] [name] Pre-Op Dx: Chest wall mass Post-op Dx: Same Procedure: Chest wall mass excision Chest wall reconstruction with 8 x 10 inch Bard bioabsorbable permanent mesh Intercostal nerve block   Surgeon and Role:      * Lightfoot, Lucile Crater, MD - Primary    Evonnie Pat, PA-C- assisting   Discharge Exam: Blood pressure 124/72, pulse 98, temperature 98.5 F (36.9 C), temperature source Oral, resp. rate 20, height 5\' 9"  (1.753 m), weight 101.2 kg, SpO2 97 %.    Disposition:  Discharge disposition: 01-Home or Self Care      Discharge Instructions     Discharge patient  Complete by: As directed    After has received his potassium supplement and JP drain is out. May give the potassium doses 4 hours apart   Discharge disposition: 01-Home or Self Care   Discharge patient date: 04/09/2021      Allergies as of  04/09/2021   No Known Allergies      Medication List     TAKE these medications    acetaminophen 500 MG tablet Commonly known as: TYLENOL Take 2 tablets (1,000 mg total) by mouth every 6 (six) hours as needed.   amLODipine 2.5 MG tablet Commonly known as: NORVASC TAKE 1 TABLET BY MOUTH ONCE DAILY   cyclobenzaprine 5 MG tablet Commonly known as: FLEXERIL Take 1 tablet (5 mg total) by mouth 3 (three) times daily as needed for muscle spasms.   ibuprofen 600 MG tablet Commonly known as: ADVIL Take 1 tablet (600 mg total) by mouth every 6 (six) hours as needed.   methylphenidate 18 MG CR tablet Commonly known as: Concerta Take 1 tablet (18 mg total) by mouth daily.   niacin 500 MG tablet Take 500 mg by mouth at bedtime.   phentermine 37.5 MG tablet Commonly known as: ADIPEX-P Take 0.5 tablets (18.75 mg total) by mouth daily before breakfast.   traMADol 50 MG tablet Commonly known as: ULTRAM Take 1 tablet (50 mg total) by mouth every 6 (six) hours as needed (mild pain).   Trintellix 10 MG Tabs tablet Generic drug: vortioxetine HBr Take 1 tablet (10 mg total) by mouth daily.        Follow-up Information     Lightfoot, Lucile Crater, MD Follow up.   Specialty: Cardiothoracic Surgery Why: Please see discharge paperwork for follow-up appointment with your surgeon.  Also obtain a MYCHART ACCOUNT.  This will assist you with managing appointments. Contact information: [place] [address] [phone]                 Signed: John Giovanni PA-C 04/10/2021, 10:25 AM

## 2021-04-07 NOTE — Progress Notes (Addendum)
[address]       [place] [address]             [phone]      1 Day Post-Op Procedure(s) (LRB): CHEST WALL RESECTION WITH MESH RECONSTRUCTION (N/A) Subjective: Used the PCA quite a bit last night but pain control has improved and starting to use less  Objective: Vital signs in last 24 hours: Temp:  [96.7 F (35.9 C)-98.4 F (36.9 C)] 98.4 F (36.9 C) (07/13 0335) Pulse Rate:  [75-108] 78 (07/13 0600) Cardiac Rhythm: Normal sinus rhythm (07/13 0335) Resp:  [11-18] 14 (07/13 0627) BP: (106-134)/(54-79) 114/56 (07/13 0600) SpO2:  [95 %-100 %] 98 % (07/13 0627) FiO2 (%):  [98 %] 98 % (07/12 1735)  Hemodynamic parameters for last 24 hours:    Intake/Output from previous day: 07/12 0701 - 07/13 0700 In: 2200 [P.O.:200; I.V.:2000] Out: 1308 [Urine:1025; Drains:113; Blood:100; Chest Tube:70] Intake/Output this shift: No intake/output data recorded.  General appearance: alert, cooperative, and no distress Heart: regular rate and rhythm Lungs: mildly dim in bases Abdomen: benign Extremities: PAS in place Wound: dressings CDI  Lab Results: Recent Labs    04/07/21 0011  WBC 21.6*  HGB 14.8  HCT 44.4  PLT 286   BMET:  Recent Labs    04/07/21 0011  NA 136  K 4.8  CL 100  CO2 27  GLUCOSE 127*  BUN 13  CREATININE 0.98  CALCIUM 9.0    PT/INR: No results for input(s): LABPROT, INR in the last 72 hours. ABG    Component Value Date/Time   PHART 7.409 04/02/2021 1500   HCO3 24.7 04/02/2021 1500   O2SAT 98.7 04/02/2021 1500   CBG (last 3)  No results for input(s): GLUCAP in the last 72 hours.  Meds Scheduled Meds:  acetaminophen  1,000 mg Oral Q6H   Or   acetaminophen (TYLENOL) oral liquid 160 mg/5 mL  1,000 mg Oral Q6H   amLODipine  2.5 mg Oral Daily   bisacodyl  10 mg Oral Daily   enoxaparin (LOVENOX) injection  40 mg Subcutaneous Daily   ketorolac  30 mg Intravenous Q6H   methylphenidate  18 mg Oral Daily   morphine    Intravenous Q4H   senna-docusate  1 tablet Oral QHS   vortioxetine HBr  10 mg Oral Daily   Continuous Infusions:  sodium chloride     PRN Meds:.Place/Maintain arterial line **AND** sodium chloride, diphenhydrAMINE **OR** diphenhydrAMINE, naloxone **AND** sodium chloride flush, ondansetron (ZOFRAN) IV, traMADol  Xrays DG Chest Port 1 View  Result Date: 04/06/2021 CLINICAL DATA:  Follow-up chest wall surgery. EXAM: PORTABLE CHEST 1 VIEW COMPARISON:  04/02/2021 FINDINGS: Thoracostomy tube in place on the right. No pneumothorax. Mass density previously seen in the right lower chest is absent. The lungs are clear except for minimal atelectasis at the right base. IMPRESSION: Status post resection of a right lower chest wall mass. No visible pneumothorax. Minimal atelectasis at the right base. Electronically Signed   By: Nelson Chimes M.D.   On: 04/06/2021 14:06    Assessment/Plan: S/P Procedure(s) (LRB): CHEST WALL RESECTION WITH MESH RECONSTRUCTION (N/A)  1 afebrile, VSS 2 sats good on 2 liters- wean/pulm toilet 3 good UOP 4 JP 113 cc- keep for now 5 CT 70 cc- poss d/c soon 6 CXR- minor right basilar ATX/ASD, no pntx on H2O seal 7 reactive leukocytosis with WBC 21- should trend down quickly 8 normal renal fxn 9 not anemic 10 pain  controlled on current RX- transition to po when tubes out 11 routine rehab modalities    LOS: 1 day    John Giovanni PA-C Pager [phone] 04/07/2021    Agree with above Will remove [place] CT, and keep blake Ambulation today Will d/c PCA following ambulation  Devann Cribb O Blenda Wisecup

## 2021-04-07 NOTE — Plan of Care (Signed)
  Problem: Coping: Goal: Level of anxiety will decrease Outcome: Not Progressing   Problem: Pain Managment: Goal: General experience of comfort will improve Outcome: Not Progressing   

## 2021-04-08 ENCOUNTER — Inpatient Hospital Stay (HOSPITAL_COMMUNITY): Payer: No Typology Code available for payment source

## 2021-04-08 LAB — CBC
HCT: 41.1 % (ref 39.0–52.0)
Hemoglobin: 13.5 g/dL (ref 13.0–17.0)
MCH: 29.7 pg (ref 26.0–34.0)
MCHC: 32.8 g/dL (ref 30.0–36.0)
MCV: 90.5 fL (ref 80.0–100.0)
Platelets: 232 10*3/uL (ref 150–400)
RBC: 4.54 MIL/uL (ref 4.22–5.81)
RDW: 12.4 % (ref 11.5–15.5)
WBC: 16.7 10*3/uL — ABNORMAL HIGH (ref 4.0–10.5)
nRBC: 0 % (ref 0.0–0.2)

## 2021-04-08 LAB — COMPREHENSIVE METABOLIC PANEL
ALT: 33 U/L (ref 0–44)
AST: 30 U/L (ref 15–41)
Albumin: 3.1 g/dL — ABNORMAL LOW (ref 3.5–5.0)
Alkaline Phosphatase: 64 U/L (ref 38–126)
Anion gap: 8 (ref 5–15)
BUN: 17 mg/dL (ref 6–20)
CO2: 26 mmol/L (ref 22–32)
Calcium: 8.8 mg/dL — ABNORMAL LOW (ref 8.9–10.3)
Chloride: 101 mmol/L (ref 98–111)
Creatinine, Ser: 1.21 mg/dL (ref 0.61–1.24)
GFR, Estimated: >60 mL/min (ref >60)
Glucose, Bld: 154 mg/dL — ABNORMAL HIGH (ref 70–99)
Potassium: 3.7 mmol/L (ref 3.5–5.1)
Sodium: 135 mmol/L (ref 135–145)
Total Bilirubin: 1 mg/dL (ref 0.3–1.2)
Total Protein: 5.6 g/dL — ABNORMAL LOW (ref 6.5–8.1)

## 2021-04-08 MED ORDER — CYCLOBENZAPRINE HCL 10 MG PO TABS
5.0000 mg | ORAL_TABLET | Freq: Two times a day (BID) | ORAL | Status: DC | PRN
  Administered 2021-04-08: 5 mg via ORAL
  Filled 2021-04-08: qty 1

## 2021-04-08 MED ORDER — KETOROLAC TROMETHAMINE 30 MG/ML IJ SOLN
15.0000 mg | Freq: Four times a day (QID) | INTRAMUSCULAR | Status: DC
  Administered 2021-04-08 – 2021-04-09 (×4): 15 mg via INTRAVENOUS
  Filled 2021-04-08 (×5): qty 1

## 2021-04-08 NOTE — Consult Note (Signed)
   [facility] Inpatient Consult   04/08/2021  AKSH SWART [birth year] [id number]   [place] Organization [ACO] Patient: Christmas plan  Patient is assigned with [place] Management for chronic disease management services.  Patient to be followed by a [place] Coordinator for the [place].   Plan: Will continue to follow and with [facility] team for post hospital support and needs. Patient will be followed by [place] Coordinator.   For questions or needs please contact:   Natividad Brood, RN BSN [facility] Liaison  [phone] business mobile phone Toll free office [phone]  Fax number: [phone] [email or web address]

## 2021-04-08 NOTE — Plan of Care (Signed)

## 2021-04-08 NOTE — Progress Notes (Addendum)
[address]       [place] [address]             [phone]      2 Days Post-Op Procedure(s) (LRB): CHEST WALL RESECTION WITH MESH RECONSTRUCTION (N/A) Subjective: C/o muscle spasms  Objective: Vital signs in last 24 hours: Temp:  [97.6 F (36.4 C)-98.6 F (37 C)] 98.6 F (37 C) (07/14 0307) Pulse Rate:  [85-124] 86 (07/14 0307) Cardiac Rhythm: Normal sinus rhythm (07/14 0307) Resp:  [11-40] 15 (07/14 0400) BP: (113-130)/(55-74) 113/67 (07/14 0307) SpO2:  [93 %-100 %] 95 % (07/14 0400)  Hemodynamic parameters for last 24 hours:    Intake/Output from previous day: 07/13 0701 - 07/14 0700 In: 500 [P.O.:500] Out: 350 [Drains:130; Chest Tube:220] Intake/Output this shift: No intake/output data recorded.  General appearance: alert, cooperative, and no distress Heart: regular rate and rhythm and tachy Lungs: dim right base Abdomen: benign Extremities: no edema Wound: dressings CDI  Lab Results: Recent Labs    04/07/21 0011 04/08/21 0018  WBC 21.6* 16.7*  HGB 14.8 13.5  HCT 44.4 41.1  PLT 286 232   BMET:  Recent Labs    04/07/21 0011 04/08/21 0018  NA 136 135  K 4.8 3.7  CL 100 101  CO2 27 26  GLUCOSE 127* 154*  BUN 13 17  CREATININE 0.98 1.21  CALCIUM 9.0 8.8*    PT/INR: No results for input(s): LABPROT, INR in the last 72 hours. ABG    Component Value Date/Time   PHART 7.409 04/02/2021 1500   HCO3 24.7 04/02/2021 1500   O2SAT 98.7 04/02/2021 1500   CBG (last 3)  No results for input(s): GLUCAP in the last 72 hours.  Meds Scheduled Meds:  acetaminophen  1,000 mg Oral Q6H   Or   acetaminophen (TYLENOL) oral liquid 160 mg/5 mL  1,000 mg Oral Q6H   amLODipine  2.5 mg Oral Daily   bisacodyl  10 mg Oral Daily   enoxaparin (LOVENOX) injection  40 mg Subcutaneous Daily   ketorolac  30 mg Intravenous Q6H   methylphenidate  18 mg Oral Daily   morphine   Intravenous Q4H   senna-docusate  1 tablet Oral QHS   vortioxetine  HBr  10 mg Oral Daily   Continuous Infusions:  sodium chloride     PRN Meds:.Place/Maintain arterial line **AND** sodium chloride, diphenhydrAMINE **OR** diphenhydrAMINE, naloxone **AND** sodium chloride flush, ondansetron (ZOFRAN) IV, traMADol  Xrays DG CHEST PORT 1 VIEW  Result Date: 04/07/2021 CLINICAL DATA:  Chest tube post lung surgery, sore chest EXAM: PORTABLE CHEST 1 VIEW COMPARISON:  Portable exam 0624 hours compared to 04/06/2021, 04/02/2021 FINDINGS: RIGHT thoracostomy tube unchanged. Suspected surgical drain overlies the inferior lateral RIGHT chest wall. Enlargement of cardiac silhouette. Mediastinal contours and pulmonary vascularity normal. Bibasilar atelectasis. Lungs otherwise clear. No acute infiltrate, pleural effusion, or pneumothorax. No acute osseous findings. IMPRESSION: RIGHT thoracostomy tube without pneumothorax. Enlargement of cardiac silhouette with bibasilar atelectasis. Resection of inferior chest wall mass identified on preoperative exam. Electronically Signed   By: Lavonia Dana M.D.   On: 04/07/2021 09:13   DG Chest Port 1 View  Result Date: 04/06/2021 CLINICAL DATA:  Follow-up chest wall surgery. EXAM: PORTABLE CHEST 1 VIEW COMPARISON:  04/02/2021 FINDINGS: Thoracostomy tube in place on the right. No pneumothorax. Mass density previously seen in the right lower chest is absent. The lungs are clear except for minimal atelectasis at the right base. IMPRESSION: Status post resection  of a right lower chest wall mass. No visible pneumothorax. Minimal atelectasis at the right base. Electronically Signed   By: Nelson Chimes M.D.   On: 04/06/2021 14:06    Assessment/Plan: S/P Procedure(s) (LRB): CHEST WALL RESECTION WITH MESH RECONSTRUCTION (N/A)  1 afeb, VSS, sinus tachy - multifactorial, mostly pain 2 sats good on 2 liters- add flutter valve 3 creat trending up a little, will reduce toradol to 15 mg 4 reactive leukocytosis trending down 5 H/H  stable- not anemic 6  CXR with increased ASD in right base with some effusion 7 bulb drain 130 cc 24/h- leave in place 8 add flexeril for spasm 9 mobilize further 10 d/c pca 11 routine rehab    LOS: 2 days    [name] Giovanni PA-C Pager [phone] 04/08/2021    Agree with above. Overall doing well. Adding muscle relaxer and lidocaine patches for pain control. Likely discharge tomorrow.  [name] [name]

## 2021-04-09 ENCOUNTER — Other Ambulatory Visit (HOSPITAL_BASED_OUTPATIENT_CLINIC_OR_DEPARTMENT_OTHER): Payer: Self-pay

## 2021-04-09 LAB — BASIC METABOLIC PANEL
Anion gap: 7 (ref 5–15)
BUN: 14 mg/dL (ref 6–20)
CO2: 28 mmol/L (ref 22–32)
Calcium: 8.3 mg/dL — ABNORMAL LOW (ref 8.9–10.3)
Chloride: 101 mmol/L (ref 98–111)
Creatinine, Ser: 0.95 mg/dL (ref 0.61–1.24)
GFR, Estimated: >60 mL/min (ref >60)
Glucose, Bld: 141 mg/dL — ABNORMAL HIGH (ref 70–99)
Potassium: 3 mmol/L — ABNORMAL LOW (ref 3.5–5.1)
Sodium: 136 mmol/L (ref 135–145)

## 2021-04-09 MED ORDER — TRAMADOL HCL 50 MG PO TABS
50.0000 mg | ORAL_TABLET | Freq: Four times a day (QID) | ORAL | 0 refills | Status: DC | PRN
  Filled 2021-04-09: qty 28, 7d supply, fill #0

## 2021-04-09 MED ORDER — METHYLPHENIDATE HCL ER (OSM) 18 MG PO TBCR: 18.0000 mg | EXTENDED_RELEASE_TABLET | Freq: Every day | ORAL | Status: DC

## 2021-04-09 MED ORDER — IBUPROFEN 600 MG PO TABS
600.0000 mg | ORAL_TABLET | Freq: Four times a day (QID) | ORAL | 0 refills | Status: DC | PRN
  Filled 2021-04-09: qty 30, 8d supply, fill #0

## 2021-04-09 MED ORDER — LIDOCAINE 5 % EX PTCH
1.0000 | MEDICATED_PATCH | CUTANEOUS | Status: DC
  Administered 2021-04-09: 1 via TRANSDERMAL
  Filled 2021-04-09: qty 1

## 2021-04-09 MED ORDER — PHENTERMINE HCL 37.5 MG PO TABS: 18.7500 mg | ORAL_TABLET | Freq: Every day | ORAL | Status: DC

## 2021-04-09 MED ORDER — ACETAMINOPHEN 500 MG PO TABS: 1000.0000 mg | ORAL_TABLET | Freq: Four times a day (QID) | ORAL | 0 refills | Status: DC | PRN

## 2021-04-09 MED ORDER — POTASSIUM CHLORIDE CRYS ER 20 MEQ PO TBCR
40.0000 meq | EXTENDED_RELEASE_TABLET | Freq: Two times a day (BID) | ORAL | Status: DC
  Administered 2021-04-09: 40 meq via ORAL
  Filled 2021-04-09: qty 2

## 2021-04-09 MED ORDER — CYCLOBENZAPRINE HCL 5 MG PO TABS
5.0000 mg | ORAL_TABLET | Freq: Three times a day (TID) | ORAL | 0 refills | Status: DC | PRN
  Filled 2021-04-09: qty 30, 10d supply, fill #0

## 2021-04-09 NOTE — Progress Notes (Signed)
[address]       [place] [address]             [phone]      3 Days Post-Op Procedure(s) (LRB): CHEST WALL RESECTION WITH MESH RECONSTRUCTION (N/A) Subjective: Feels pretty well, pain minor on current rx  Objective: Vital signs in last 24 hours: Temp:  [97.8 F (36.6 C)-100 F (37.8 C)] 98.5 F (36.9 C) (07/15 0714) Pulse Rate:  [85-98] 98 (07/15 0335) Cardiac Rhythm: Normal sinus rhythm (07/15 0727) Resp:  [16-20] 20 (07/15 0714) BP: (118-124)/(64-75) 124/72 (07/15 0714) SpO2:  [94 %-97 %] 96 % (07/15 0714) FiO2 (%):  [98 %] 98 % (07/14 0735)  Hemodynamic parameters for last 24 hours:    Intake/Output from previous day: 07/14 0701 - 07/15 0700 In: -  Out: 70 [Drains:70] Intake/Output this shift: Total I/O In: 360 [P.O.:360] Out: -   General appearance: alert, cooperative, and no distress Heart: regular rate and rhythm Lungs: dim right base Abdomen: benign Extremities: np edema Wound: incis healing well, some echymosis   Lab Results: Recent Labs    04/07/21 0011 04/08/21 0018  WBC 21.6* 16.7*  HGB 14.8 13.5  HCT 44.4 41.1  PLT 286 232   BMET:  Recent Labs    04/08/21 0018 04/09/21 0028  NA 135 136  K 3.7 3.0*  CL 101 101  CO2 26 28  GLUCOSE 154* 141*  BUN 17 14  CREATININE 1.21 0.95  CALCIUM 8.8* 8.3*    PT/INR: No results for input(s): LABPROT, INR in the last 72 hours. ABG    Component Value Date/Time   PHART 7.409 04/02/2021 1500   HCO3 24.7 04/02/2021 1500   O2SAT 98.7 04/02/2021 1500   CBG (last 3)  No results for input(s): GLUCAP in the last 72 hours.  Meds Scheduled Meds:  acetaminophen  1,000 mg Oral Q6H   Or   acetaminophen (TYLENOL) oral liquid 160 mg/5 mL  1,000 mg Oral Q6H   amLODipine  2.5 mg Oral Daily   bisacodyl  10 mg Oral Daily   enoxaparin (LOVENOX) injection  40 mg Subcutaneous Daily   ketorolac  15 mg Intravenous Q6H   methylphenidate  18 mg Oral Daily   senna-docusate  1 tablet  Oral QHS   vortioxetine HBr  10 mg Oral Daily   Continuous Infusions:  sodium chloride     PRN Meds:.Place/Maintain arterial line **AND** sodium chloride, cyclobenzaprine, diphenhydrAMINE **OR** diphenhydrAMINE, naloxone **AND** sodium chloride flush, ondansetron (ZOFRAN) IV, traMADol  Xrays DG CHEST PORT 1 VIEW  Result Date: 04/08/2021 CLINICAL DATA:  Chest tube removal EXAM: PORTABLE CHEST 1 VIEW COMPARISON:  04/07/2021 FINDINGS: Interval removal of a right-sided chest tube. There is no significant pneumothorax, however there has been accumulation of a small to moderate right pleural effusion with associated atelectasis or consolidation. The left lung is normally aerated. Mild cardiomegaly IMPRESSION: Interval removal of right-sided chest tube. There is no significant pneumothorax, however there has been accumulation of a small to moderate right pleural effusion with associated atelectasis or consolidation. Electronically Signed   By: Eddie Candle M.D.   On: 04/08/2021 09:24    Assessment/Plan: S/P Procedure(s) (LRB): CHEST WALL RESECTION WITH MESH RECONSTRUCTION (N/A)  1 afeb, VSS - sats good off O2, SR/Stachy 2 renal fxn improved on lower dose or toradol, K+ 3.0 - will replace 3 JP 70 cc 24 /h 4 has only walked in room, will see how he does walking in  halls today [place] home early afternoon if no new issues and does well with ambulation   LOS: 3 days    John Giovanni 04/09/2021

## 2021-04-09 NOTE — Consult Note (Signed)
   [facility] Inpatient Consult   04/09/2021  TRAE BOVENZI 1985-10-10 [id number]  [employer] Organization [ACO] Patient: Leonard by to see patient however, he had just transitioned home per nurse.  Patient will have a telephonic follow up call for post hospital support in the [place] benefit.   [facility]) Care Management is working in partnership with you to provide your patient with Disease Management, Transition of Care, Complex Care Management, and Wellness programs.           Natividad Brood, RN BSN [facility] Liaison  [phone] business mobile phone Toll free office [phone]  Fax number: [phone] [email or web address]

## 2021-04-09 NOTE — Plan of Care (Signed)

## 2021-04-12 ENCOUNTER — Other Ambulatory Visit: Payer: Self-pay | Admitting: *Deleted

## 2021-04-12 ENCOUNTER — Encounter: Payer: Self-pay | Admitting: *Deleted

## 2021-04-12 NOTE — Patient Outreach (Signed)
[facility]) Care Management  04/12/2021  [name] [name] Sep 03, 1986 [id number]   Transition of care call/case closure   Referral received:04/06/21 Initial outreach:04/12/21 Insurance: [employer]   Subjective: Initial successful telephone call to patient's preferred number in order to complete transition of care assessment; 2 HIPAA identifiers verified. Explained purpose of call and completed transition of care assessment.  [name] [name] states that he is doing alright adjusted to movement with chest incision area. He denies post-operative problems, says surgical incisions are unremarkable, denies redness at incision area, reinforced keeping site clean and dry and okay to have dry dressing. He states surgical pain well managed with prescribed medications, tolerating diet, denies bowel or bladder problems.  His Parent are available to assist with his recovery.   Reviewed accessing the following [place] Benefits :  States hedoes not have the hospital indemnity He uses a [facility] outpatient [facility] at [facility]     Objective:  [name] [name] was [facility] at [facility]/12-7/15/22 for Mass Chest Wall, chest wall resection with mesh reconstruction Comorbidities include: Left Nephrolithiasis, hypertriglyceridemia. He was discharged to home on 04/09/21  without the need for home health services or DME.   Assessment:  Patient voices good understanding of all discharge instructions.  See transition of care flowsheet for assessment details.   Plan:  Reviewed hospital discharge diagnosis of Chest Wall resection  and discharge treatment plan using hospital discharge instructions, assessing medication adherence, reviewing problems requiring provider notification, and discussing the importance of follow up with surgeon, primary care provider and/or specialists as directed.  Reviewed  healthy lifestyle program information to receive discounted premium  for  2023   Step 1: Get  your annual physical  Step 2: Complete your health assessment  Step 3:Identify your current health status and complete the corresponding action step between September 26, 2020 and May 27, 2021.     No ongoing care management needs identified so will close case to [place] Management services and route successful outreach letter with [place] Management pamphlet and 24 Hour Nurse Line Magnet to [place] Management clinical pool to be mailed to patient's home address.  Thanked patient for their services to [facility].  Joylene Draft, RN, BSN  [place] Management Coordinator  [phone]- Mobile [phone]- Toll Free Main Office

## 2021-04-13 ENCOUNTER — Other Ambulatory Visit (HOSPITAL_BASED_OUTPATIENT_CLINIC_OR_DEPARTMENT_OTHER): Payer: Self-pay

## 2021-04-13 MED ORDER — HYDROCODONE-ACETAMINOPHEN 10-325 MG PO TABS
1.0000 | ORAL_TABLET | Freq: Three times a day (TID) | ORAL | 0 refills | Status: DC | PRN
  Filled 2021-04-13: qty 30, 10d supply, fill #0

## 2021-04-14 ENCOUNTER — Other Ambulatory Visit (HOSPITAL_BASED_OUTPATIENT_CLINIC_OR_DEPARTMENT_OTHER): Payer: Self-pay

## 2021-04-16 ENCOUNTER — Telehealth: Payer: Self-pay | Admitting: *Deleted

## 2021-04-16 NOTE — Telephone Encounter (Signed)
Patient contacted the office stating when he stood up from lunch this afternoon he noticed blood dripping from one of his incisions. Patient is s/p chest wall reconstruction 7/12 by Dr. Kipp Brood. Patient states drainage is red in color. Patient and mother believe blood to becoming from either old chest tube site or JP drain site. Patient states he had a low grade temperature two days after leaving the hospital but was an isolated event. Reports coughing more after using incentive spirometer. Denies SOB. Photo sent to RN office phone. Upon observation, red blood is noticeably exiting from small incision. This matter was discussed with T. Craig, [place]. Per PA, patient was advised to place a pressure dressing over the area and to report any increase of bleeding, SOB, dizziness, or lightheadedness immediately. Patient and mother verbalize understanding.

## 2021-04-19 ENCOUNTER — Telehealth: Payer: Self-pay | Admitting: *Deleted

## 2021-04-19 NOTE — Telephone Encounter (Signed)
Followed up on call placed over the weekend to on call provider. Patient c/o bleeding from surgical site. Per patient, he spoke with Dr. Cyndia Bent who confirmed bleeding from site can be normal. Today, patient reports minimal bleeding from site with improvement of bruising. Patient f/u for suture removal tomorrow. No further questions at this time.

## 2021-04-20 ENCOUNTER — Other Ambulatory Visit: Payer: Self-pay

## 2021-04-20 ENCOUNTER — Ambulatory Visit (INDEPENDENT_AMBULATORY_CARE_PROVIDER_SITE_OTHER): Payer: Self-pay | Admitting: *Deleted

## 2021-04-20 DIAGNOSIS — Z4802 Encounter for removal of sutures: Secondary | ICD-10-CM

## 2021-04-20 NOTE — Progress Notes (Signed)
Patient arrived for nurse visit to remove sutures post- chest wall reconstruction 7/12 by Dr. Kipp Brood. One suture removed with no signs or symptoms of infection noted.  Patient tolerated suture removal well.  Patient instructed to keep the incision site clean and dry. Patient acknowledged instructions given. All questions answered.

## 2021-04-23 ENCOUNTER — Ambulatory Visit (INDEPENDENT_AMBULATORY_CARE_PROVIDER_SITE_OTHER): Payer: Self-pay | Admitting: Thoracic Surgery (Cardiothoracic Vascular Surgery)

## 2021-04-23 ENCOUNTER — Encounter: Payer: Self-pay | Admitting: Thoracic Surgery (Cardiothoracic Vascular Surgery)

## 2021-04-23 ENCOUNTER — Other Ambulatory Visit: Payer: Self-pay

## 2021-04-23 VITALS — BP 124/79 | HR 88 | Resp 20 | Ht 69.0 in | Wt 220.0 lb

## 2021-04-23 DIAGNOSIS — Z09 Encounter for follow-up examination after completed treatment for conditions other than malignant neoplasm: Secondary | ICD-10-CM

## 2021-04-23 DIAGNOSIS — R222 Localized swelling, mass and lump, trunk: Secondary | ICD-10-CM

## 2021-04-23 NOTE — Progress Notes (Signed)
      [address]       [place] [address]             [phone]        [name] [name] Medical Record [id number] Date of Birth: [birth year]  Referring: [name] [name],* Primary Care: [name] Decamp, MD Primary Cardiologist:None  Reason for visit:   follow-up  History of Present Illness:     [name] [name] presents for his 1 week follow-up appointment.  Overall he is doing well.  He is only using Tylenol and ibuprofen for pain.  He has had some drainage from one of the chest tube sites but this is slowing down nicely.  He denies any redness at the chest tube insertion sites.  Physical Exam: BP 124/79   Pulse 88   Resp 20   Ht '5\' 9"'$  (1.753 m)   Wt 220 lb (99.8 kg)   SpO2 98% Comment: RA  BMI 32.49 kg/m   Alert NAD Incision healing well..   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  The final pathology is still pending.  I will touch base with the pathologist and review the final pathology report.  [name] [name] is doing well from a pain standpoint.  He is only on Tylenol and ibuprofen.  I recommended that he remain on his weight restrictions for another month.  I will see him back in 49monthwith a chest x-ray. Once the pathology has resulted I will give him a call to discuss the findings.   HLajuana Matte7/29/2022 2:29 PM

## 2021-04-26 ENCOUNTER — Encounter (HOSPITAL_COMMUNITY): Payer: Self-pay | Admitting: Thoracic Surgery (Cardiothoracic Vascular Surgery)

## 2021-04-28 ENCOUNTER — Encounter (HOSPITAL_COMMUNITY): Payer: Self-pay | Admitting: Thoracic Surgery (Cardiothoracic Vascular Surgery)

## 2021-05-18 ENCOUNTER — Other Ambulatory Visit (HOSPITAL_BASED_OUTPATIENT_CLINIC_OR_DEPARTMENT_OTHER): Payer: Self-pay

## 2021-05-19 ENCOUNTER — Encounter (HOSPITAL_COMMUNITY): Payer: Self-pay

## 2021-05-20 ENCOUNTER — Other Ambulatory Visit: Payer: Self-pay | Admitting: Thoracic Surgery (Cardiothoracic Vascular Surgery)

## 2021-05-20 DIAGNOSIS — R222 Localized swelling, mass and lump, trunk: Secondary | ICD-10-CM

## 2021-05-21 ENCOUNTER — Ambulatory Visit
Admission: RE | Admit: 2021-05-21 | Discharge: 2021-05-21 | Disposition: A | Discharge status: Home / Self Care | Payer: No Typology Code available for payment source | Source: Ambulatory Visit | Attending: Thoracic Surgery (Cardiothoracic Vascular Surgery) | Admitting: Thoracic Surgery (Cardiothoracic Vascular Surgery)

## 2021-05-21 ENCOUNTER — Other Ambulatory Visit: Payer: Self-pay

## 2021-05-21 ENCOUNTER — Ambulatory Visit (INDEPENDENT_AMBULATORY_CARE_PROVIDER_SITE_OTHER): Payer: Self-pay | Admitting: Thoracic Surgery (Cardiothoracic Vascular Surgery)

## 2021-05-21 VITALS — BP 123/84 | HR 85 | Resp 20 | Ht 69.0 in | Wt 228.0 lb

## 2021-05-21 DIAGNOSIS — R222 Localized swelling, mass and lump, trunk: Secondary | ICD-10-CM

## 2021-05-21 DIAGNOSIS — Z09 Encounter for follow-up examination after completed treatment for conditions other than malignant neoplasm: Secondary | ICD-10-CM

## 2021-05-21 NOTE — Progress Notes (Signed)
      [address]       [name] [address]             [phone]        [name] [name]  Medical Record [id number] Date of Birth: [birth year]  Referring: [name] [name],* Primary Care: [name] Decamp, MD Primary Cardiologist:None  Reason for visit:   follow-up  History of Present Illness:     [name] presents for his 1 month follow-up appointment.  He is status post chest wall mass resection and mesh reconstruction.  Overall he is doing well.  He has noticed some new fullness underneath the incision but denies any redness or drainage.  Physical Exam: BP 123/84   Pulse 85   Resp 20   Ht '5\' 9"'$  (1.753 m)   Wt 228 lb (103.4 kg)   SpO2 99% Comment: RA  BMI 33.67 kg/m   Alert NAD Incision clean.  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  Margins all negative.    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  Currently doing well.  He does have effusion on the right side but does not have any respiratory symptoms.  I am tempted to leave this alone given that he admits to not taking deep breaths because of some tightness and pain.  He does feel much better now.  His case was presented at our tumor board to determine whether or not he needs radiation.  I will touch base with him in 1week to discuss the results of her tumor board decision.  I will see him back in 1 month with another chest x-ray to assess his pleural effusion.   Lajuana Matte 05/21/2021 4:41 PM

## 2021-05-27 ENCOUNTER — Other Ambulatory Visit: Payer: Self-pay | Admitting: *Deleted

## 2021-05-27 NOTE — Progress Notes (Signed)
The proposed treatment discussed in cancer conference is for discussion purpose only and is not a binding recommendation. The patient was not physically examined nor present for their treatment options. Therefore, final treatment plans cannot be decided.  ?

## 2021-05-28 ENCOUNTER — Telehealth (INDEPENDENT_AMBULATORY_CARE_PROVIDER_SITE_OTHER): Payer: Self-pay | Admitting: Thoracic Surgery (Cardiothoracic Vascular Surgery)

## 2021-05-28 ENCOUNTER — Other Ambulatory Visit: Payer: Self-pay

## 2021-05-28 DIAGNOSIS — Z09 Encounter for follow-up examination after completed treatment for conditions other than malignant neoplasm: Secondary | ICD-10-CM

## 2021-05-28 NOTE — Progress Notes (Signed)
     [address]       [place] [address]             [phone]       Patient: Home Provider: Office Consent for Telemedicine visit obtained.  Today's visit was completed via a real-time telehealth (see specific modality noted below). The patient/authorized person provided oral consent at the time of the visit to engage in a telemedicine encounter with the present provider at [facility]. The patient/authorized person was informed of the potential benefits, limitations, and risks of telemedicine. The patient/authorized person expressed understanding that the laws that protect confidentiality also apply to telemedicine. The patient/authorized person acknowledged understanding that telemedicine does not provide emergency services and that he or she would need to call 911 or proceed to the nearest hospital for help if such a need arose.   Total time spent in the clinical discussion 10 minutes.  Telehealth Modality: Phone visit (audio only)  I had a telephone visit with [name] [name].  He is status post chest wall resection July 2022.  He has returned back to work and is doing well.  He continues to have some shortness of breath but this is getting better with more exercise and use of the incentive spirometer.  This case was discussed in tumor board, and the plan is for [place] to refer him to radiation oncology for further assessment.  We are awaiting the slides to return from [facility] to assess the margins.  I am scheduled to see him back in 1 month with a chest x-ray to assess his effusion.  After that I likely will follow with him by annually with cross-sectional imaging.

## 2021-06-01 ENCOUNTER — Ambulatory Visit (INDEPENDENT_AMBULATORY_CARE_PROVIDER_SITE_OTHER): Payer: No Typology Code available for payment source

## 2021-06-01 ENCOUNTER — Ambulatory Visit (INDEPENDENT_AMBULATORY_CARE_PROVIDER_SITE_OTHER): Payer: No Typology Code available for payment source | Admitting: Sports Medicine

## 2021-06-01 ENCOUNTER — Other Ambulatory Visit: Payer: Self-pay

## 2021-06-01 DIAGNOSIS — R222 Localized swelling, mass and lump, trunk: Secondary | ICD-10-CM

## 2021-06-01 DIAGNOSIS — R2241 Localized swelling, mass and lump, right lower limb: Secondary | ICD-10-CM

## 2021-06-01 NOTE — Assessment & Plan Note (Signed)
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

## 2021-06-01 NOTE — Progress Notes (Signed)
    Procedures performed today:    None.  Independent interpretation of notes and tests performed by another provider:   None.  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    ___________________________________________ Gwen Her. Dianah Field, M.D., ABFM., CAQSM. Primary Care and [place] Instructor of [place] [employer] [facility] [employer]

## 2021-06-02 NOTE — Progress Notes (Signed)
Thoracic Location of Tumor / Histology: Right Chest Wall Mass  Patient presented for evaluation of kidney stones.  Imaging was obtained and showed abnormality within the chest.    CT Chest 03/11/2021: 4.5 x 6.4 x 4.3 cm mass lesion at the right anterolateral chest wall expands and distorts the anterior inferior aspect of the right fifth rib. There is irregular periosteal reaction in the cortex of the rib with possible Codman's triangle appearance. Imaging features raise concern for neoplasm such as Ewing sarcoma/Askin tumor. Osteosarcoma or metastatic lesion not excluded. Solitary fibrous tumor of the pleura is considered less likely given the apparent chest wall invasion.  No definite pulmonary metastases although a 3 mm left lower lobe pulmonary nodule is noted. Attention on follow-up recommended.  Biopsies of Chest Wall Mass 03/16/2021   Tobacco/Marijuana/Snuff/ETOH use: Non Smoker  Past/Anticipated interventions by cardiothoracic surgery, if any:  Dr. Kipp Brood 05/28/2021 -He continues to have some shortness of breath but this is getting better with more exercise and use of the incentive spirometer.   -This case was discussed in tumor board, and the plan is for [place] to refer him to radiation oncology for further assessment.   -We are awaiting the slides to return from [facility] to assess the margins.   - I am scheduled to see him back in 1 month with a chest x-ray to assess his effusion.  After that I likely will follow with him by annually with cross-sectional imaging. 03/26/2021 -35 year old male with soft tissue mass involving the right chest.   -CT-guided biopsy revealed that this was a chondroid neoplasm.   -On review of the scan it does appear to have some destruction of the fifth rib.  The sixth rib does not appear involved.   -There are some calcifications within the neoplasm which speaks to his chronicity.   -We discussed the risks and benefits of surgical excision with mesh  reconstruction of his chest wall.  He is agreeable to proceed and is tentatively scheduled for April 06, 2021. -Chest Wall Resection with mesh reconstruction 04/06/2021 -Follow-up 06/25/2021   Past/Anticipated interventions by medical oncology, if any:   Signs/Symptoms Weight changes, if any: Has gained a few pounds since surgical recovery. Respiratory complaints, if any: No Hemoptysis, if any: None noted. Pain issues, if any:  No  SAFETY ISSUES: Prior radiation? No Pacemaker/ICD?  No Possible current pregnancy? N/a Is the patient on methotrexate? No  Current Complaints / other details:

## 2021-06-03 ENCOUNTER — Ambulatory Visit
Admission: RE | Admit: 2021-06-03 | Discharge: 2021-06-03 | Disposition: A | Discharge status: Home / Self Care | Payer: No Typology Code available for payment source | Source: Ambulatory Visit | Attending: Radiation Oncology | Admitting: Radiation Oncology

## 2021-06-03 ENCOUNTER — Other Ambulatory Visit: Payer: Self-pay | Admitting: Radiation Oncology

## 2021-06-03 ENCOUNTER — Encounter: Payer: Self-pay | Admitting: Radiation Oncology

## 2021-06-03 ENCOUNTER — Other Ambulatory Visit: Payer: Self-pay

## 2021-06-03 VITALS — BP 141/90 | HR 91 | Temp 96.2°F | Resp 20 | Ht 69.0 in | Wt 232.1 lb

## 2021-06-03 DIAGNOSIS — Z87442 Personal history of urinary calculi: Secondary | ICD-10-CM | POA: Diagnosis not present

## 2021-06-03 DIAGNOSIS — F988 Other specified behavioral and emotional disorders with onset usually occurring in childhood and adolescence: Secondary | ICD-10-CM | POA: Diagnosis not present

## 2021-06-03 DIAGNOSIS — Z8042 Family history of malignant neoplasm of prostate: Secondary | ICD-10-CM | POA: Diagnosis not present

## 2021-06-03 DIAGNOSIS — Z808 Family history of malignant neoplasm of other organs or systems: Secondary | ICD-10-CM | POA: Diagnosis not present

## 2021-06-03 DIAGNOSIS — I7300 Raynaud's syndrome without gangrene: Secondary | ICD-10-CM | POA: Diagnosis not present

## 2021-06-03 DIAGNOSIS — Z791 Long term (current) use of non-steroidal anti-inflammatories (NSAID): Secondary | ICD-10-CM | POA: Insufficient documentation

## 2021-06-03 DIAGNOSIS — R011 Cardiac murmur, unspecified: Secondary | ICD-10-CM | POA: Insufficient documentation

## 2021-06-03 DIAGNOSIS — E785 Hyperlipidemia, unspecified: Secondary | ICD-10-CM | POA: Diagnosis not present

## 2021-06-03 DIAGNOSIS — C413 Malignant neoplasm of ribs, sternum and clavicle: Secondary | ICD-10-CM | POA: Diagnosis present

## 2021-06-03 DIAGNOSIS — Z79899 Other long term (current) drug therapy: Secondary | ICD-10-CM | POA: Diagnosis not present

## 2021-06-03 DIAGNOSIS — R222 Localized swelling, mass and lump, trunk: Secondary | ICD-10-CM

## 2021-06-03 LAB — SURGICAL PATHOLOGY

## 2021-06-03 NOTE — Addendum Note (Signed)
Encounter addended by: Hayden Pedro, PA-C on: 06/03/2021 5:06 PM  Actions taken: Pend clinical note

## 2021-06-03 NOTE — Progress Notes (Addendum)
Radiation Oncology         ([phone]) [phone] ________________________________  Name: [name] [name]        MRN: [id number]  Date of Service: 06/03/2021 DOB: November 03, 1985  KW:[id number], Gwen Her, MD  Lajuana Matte, MD     REFERRING PHYSICIAN: Lajuana Matte, MD   DIAGNOSIS: The encounter diagnosis was Mass of right chest wall.   HISTORY OF PRESENT ILLNESS: [name] [name] is a 35 y.o. male seen at the request of Dr. Kipp Brood for a diagnosis of a cartilaginous tumor of the chest. The patient originally presented to his PCP for left flank pain and was being worked up for a kidney stone.  Plain films did not show any renal stones but renal protocol ultrasound showed unilateral hydronephrosis compatible with an acute obstructive uropathy no calculus was identified and a CT abdomen and pelvis was recommended with renal protocol.  This was performed on 03/10/2021 showing a mild left hydroureteronephrosis at the level of a 1 to 2 mm stone at the left UV junction incidentally is 6.4 cm soft tissue mass in the right anterior lateral chest wall arising from the right anterior lateral fifth rib was identified demonstrating a cortical break in periosteal reaction with extension to the adjacent pleura numerous sclerotic lesions in the pelvic girdle and spine representing bone islands were felt to be present as well a CT chest on 03/11/2021 identified the lesion and again measured this in the right anterior lateral chest wall up to 6.4 cm eroding and expanding through the aspect of the right fifth rib extending into the chest wall between the fifth and sixth ribs it was felt to be well-defined with internal calcifications and periosteal reaction in the right fifth rib was noted this extended between the fifth and sixth ribs and appeared to involve the serratus anterior muscle with soft tissue tracking superficial to the right fifth rib cranially it was contiguous with but did not destroy the sixth rib a 3 mm  left lower lobe nodule was also noted.  He underwent a CT-guided biopsy of this on 03/16/2021 which identified a chondroid neoplasm consistent with periosteal chondroma.  He met with Dr. Kipp Brood and underwent chest wall resection with mesh reconstruction on 04/06/2021.  Final pathology from this procedure was also reviewed at [facility], there was felt to be focal hypercellular well differentiated cartilaginous neoplasm with mild nuclear atypia and Dr. Kris Mouton from [place] and women's description documented that the preferred terminology for this would be a grade 1 chondrosarcoma but again is felt to be a very rare distribution based on location.  The patient was presented in multidisciplinary thoracic oncology conference, and he is seen today to discuss the possibility of radiotherapy to reduce risks of local recurrence.  He also noted prominence in the right lower extremity and it was felt that this represented the tuberosity of the right tibia no evidence of fracture dislocation or focal bone lesion was seen on the plain films that were performed on 06/01/2021.    PREVIOUS RADIATION THERAPY: No   PAST MEDICAL HISTORY:  Past Medical History:  Diagnosis Date   ADD (attention deficit disorder)    Anxiety    Heart murmur    History of kidney stones    Hyperlipidemia    Raynaud disease    Seasonal allergies        PAST SURGICAL HISTORY: Past Surgical History:  Procedure Laterality Date   CHEST WALL RECONSTRUCTION N/A 04/06/2021   Procedure: CHEST WALL  RESECTION WITH MESH RECONSTRUCTION;  Surgeon: Lajuana Matte, MD;  Location: [place];  Service: Thoracic;  Laterality: N/A;   INNER EAR SURGERY       FAMILY HISTORY:  Family History  Problem Relation Age of Onset   Cancer Father    Hypertension Father    Prostate cancer Father    Melanoma Father    Cancer Paternal Uncle    Prostate cancer Paternal Uncle    Stroke Maternal Grandfather    Hypertension Paternal Grandmother     Cancer Paternal Grandfather    Alzheimer's disease Paternal Grandfather    Prostate cancer Paternal Grandfather      SOCIAL HISTORY:  reports that he has never smoked. He has never used smokeless tobacco. He reports that he does not drink alcohol and does not use drugs. 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 locations. He enjoys going to [employer] football games. He lives in [place].    ALLERGIES: Patient has no known allergies.   MEDICATIONS:  Current Outpatient Medications  Medication Sig Dispense Refill   amLODipine (NORVASC) 2.5 MG tablet TAKE 1 TABLET BY MOUTH ONCE DAILY 90 tablet 2   meloxicam (MOBIC) 15 MG tablet      methylphenidate (CONCERTA) 18 MG PO CR tablet Take 1 tablet (18 mg total) by mouth daily.     niacin 500 MG tablet Take 500 mg by mouth at bedtime.     phentermine (ADIPEX-P) 37.5 MG tablet Take 0.5 tablets (18.75 mg total) by mouth daily before breakfast.     vortioxetine HBr (TRINTELLIX) 10 MG TABS tablet Take 1 tablet (10 mg total) by mouth daily. 30 tablet 11   No current facility-administered medications for this encounter.     REVIEW OF SYSTEMS: On review of systems, the patient reports that he is doing well overall.  He feels as though he is healed up very nicely and denies any concerns with his incision sites with swelling or of his prior drain sites.  He denies any shortness of breath but admits that he has not been able to exert himself as a result of recent surgery.  At rest he denies shortness of breath however.  No other complaints are verbalized.  PHYSICAL EXAM:  Wt Readings from Last 3 Encounters:  06/03/21 232 lb 2 oz (105.3 kg)  05/21/21 228 lb (103.4 kg)  04/23/21 220 lb (99.8 kg)   Temp Readings from Last 3 Encounters:  06/03/21 (!) 96.2 F (35.7 C) (Temporal)  04/09/21 98.5 F (36.9 C) (Oral)  04/02/21 98.5 F (36.9 C) (Oral)   BP Readings from Last 3  Encounters:  06/03/21 (!) 141/90  05/21/21 123/84  04/23/21 124/79   Pulse Readings from Last 3 Encounters:  06/03/21 91  05/21/21 85  04/23/21 88   Pain Assessment Pain Score: 0-No pain/10  In general this is a well appearing Caucasian male in no acute distress.  He's alert and oriented x4 and appropriate throughout the examination. Cardiopulmonary assessment is negative for acute distress and he exhibits normal effort.     ECOG = 0  0 - Asymptomatic (Fully active, able to carry on all predisease activities without restriction)  1 - Symptomatic but completely ambulatory (Restricted in physically strenuous activity but ambulatory and able to carry out work of a light or sedentary nature. For example, light housework, office work)  2 - Symptomatic, <50% in bed during the day (Ambulatory and capable  of all self care but unable to carry out any work activities. Up and about more than 50% of waking hours)  3 - Symptomatic, >50% in bed, but not bedbound (Capable of only limited self-care, confined to bed or chair 50% or more of waking hours)  4 - Bedbound (Completely disabled. Cannot carry on any self-care. Totally confined to bed or chair)  5 - Death   Eustace Pen MM, Creech RH, Tormey DC, et al. [phone]). "Toxicity and response criteria of the [facility]". [place] Oncol. 5 (6): 649-55    LABORATORY DATA:  Lab Results  Component Value Date   WBC 16.7 (H) 04/08/2021   HGB 13.5 04/08/2021   HCT 41.1 04/08/2021   MCV 90.5 04/08/2021   PLT 232 04/08/2021   Lab Results  Component Value Date   NA 136 04/09/2021   K 3.0 (L) 04/09/2021   CL 101 04/09/2021   CO2 28 04/09/2021   Lab Results  Component Value Date   ALT 33 04/08/2021   AST 30 04/08/2021   ALKPHOS 64 04/08/2021   BILITOT 1.0 04/08/2021      RADIOGRAPHY: DG Chest 2 View  Result Date: 06/02/2021 CLINICAL DATA:  35 year old male with a history of pleural effusion. History of prior chest  wall mass resection and reconstruction EXAM: CHEST - 2 VIEW COMPARISON:  05/21/2021, 04/08/2021, 04/07/2021, 04/06/2021, 04/02/2021 FINDINGS: Cardiomediastinal silhouette unchanged in size and contour. Similar appearance of asymmetric elevation of the right hemidiaphragm with blunting of the right costophrenic angle. Hazy opacity at the lateral right lung base associated with pleuroparenchymal thickening. The appearance is unchanged from the comparison chest x-ray. No pneumothorax.  No confluent airspace disease. No displaced fracture. IMPRESSION: Appearance at the right lung base of hazy opacity and pleuroparenchymal thickening is unchanged, and favored to represent postsurgical changes given the patient's recent history of chest wall resection and reconstruction. Electronically Signed   By: Corrie Mckusick D.O.   On: 06/02/2021 09:56   DG Chest 2 View  Result Date: 05/21/2021 CLINICAL DATA:  History of chest wall mass removal EXAM: CHEST - 2 VIEW COMPARISON:  04/08/2021 FINDINGS: Heart size is within normal limits. Small right pleural effusion. Hazy opacity in the right lung base. Left lung is clear. No pneumothorax. IMPRESSION: Small right pleural effusion with hazy opacity in the right lung base, which may reflect atelectasis or infection. Electronically Signed   By: Davina Poke D.O.   On: 05/21/2021 12:54   DG Knee Complete 4 Views Right  Result Date: 06/02/2021 CLINICAL DATA:  AP, lateral, obliques, question mass right tibial tuberosity EXAM: RIGHT KNEE - COMPLETE 4+ VIEW COMPARISON:  None. FINDINGS: There is no evidence of acute fracture or dislocation. No focal bone lesion. There is no significant degenerative change. No joint effusion. No abnormality at the area of palpable concern. IMPRESSION: Negative right knee radiographs. Electronically Signed   By: Maurine Simmering M.D.   On: 06/02/2021 16:47       IMPRESSION/PLAN: 1. 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 Dr. Lisbeth Renshaw discusses the pathology  findings and reviews the nature of chest wall based tumors.  He reviews the guidelines for work-up and pathologic assessment and recommends basing decisions on adjuvant radiotherapy on the margins of his tumor resection.  We will reach out to Dr. Melina Copa who is awaiting the stains and slides back from [place] [facility] in [place].  We did discuss the utility of definitive radiotherapy between 6 and 7 weeks as a daily  modality of trying to prevent recurrence and maintain local control again if the margins were positive.  We discussed the risks, benefits, short, and long term effects of radiotherapy, as well as the curative intent. Dr. Lisbeth Renshaw discusses the delivery and logistics of radiotherapy. We will coordinate next steps by phone with the patient if Dr. Lisbeth Renshaw recommends with radiation after reviewing his margins. 2. Possible genetic predisposition to malignancy. The patient is a candidate for genetic testing given his family history. He  was offered referral and will be scheduled in genetics clinic.  In a visit lasting 60 minutes, greater than 50% of the time was spent face to face discussing the patient's condition, in preparation for the discussion, and coordinating the patient's care.    The above documentation reflects my direct findings during this shared patient visit. Please see the separate note by Dr. Lisbeth Renshaw on this date for the remainder of the patient's plan of care.    Carola Rhine, [facility]   **Disclaimer: This note was dictated with voice recognition software. Similar sounding words can inadvertently be transcribed and this note may contain transcription errors which may not have been corrected upon publication of note.**

## 2021-06-04 ENCOUNTER — Telehealth: Payer: Self-pay | Admitting: Licensed Clinical Social Worker

## 2021-06-04 ENCOUNTER — Encounter: Payer: Self-pay | Admitting: Radiation Oncology

## 2021-06-04 NOTE — Addendum Note (Signed)
Encounter addended by: Hayden Pedro, PA-C on: 06/04/2021 6:56 AM  Actions taken: Clinical Note Signed

## 2021-06-04 NOTE — Telephone Encounter (Signed)
Scheduled appt per 9/8 referral. Pt is aware of appt date and time.

## 2021-06-07 ENCOUNTER — Other Ambulatory Visit: Payer: Self-pay | Admitting: Sports Medicine

## 2021-06-07 ENCOUNTER — Inpatient Hospital Stay: Payer: No Typology Code available for payment source

## 2021-06-07 ENCOUNTER — Other Ambulatory Visit: Payer: Self-pay

## 2021-06-07 ENCOUNTER — Encounter: Payer: Self-pay | Admitting: Licensed Clinical Social Worker

## 2021-06-07 ENCOUNTER — Inpatient Hospital Stay
Payer: No Typology Code available for payment source | Attending: Hematology & Oncology | Admitting: Licensed Clinical Social Worker

## 2021-06-07 ENCOUNTER — Other Ambulatory Visit (HOSPITAL_BASED_OUTPATIENT_CLINIC_OR_DEPARTMENT_OTHER): Payer: Self-pay

## 2021-06-07 DIAGNOSIS — Z800 Family history of malignant neoplasm of digestive organs: Secondary | ICD-10-CM | POA: Insufficient documentation

## 2021-06-07 DIAGNOSIS — Z803 Family history of malignant neoplasm of breast: Secondary | ICD-10-CM

## 2021-06-07 DIAGNOSIS — R222 Localized swelling, mass and lump, trunk: Secondary | ICD-10-CM | POA: Diagnosis not present

## 2021-06-07 DIAGNOSIS — F909 Attention-deficit hyperactivity disorder, unspecified type: Secondary | ICD-10-CM

## 2021-06-07 DIAGNOSIS — Z8042 Family history of malignant neoplasm of prostate: Secondary | ICD-10-CM

## 2021-06-07 DIAGNOSIS — M545 Low back pain, unspecified: Secondary | ICD-10-CM

## 2021-06-07 DIAGNOSIS — G8929 Other chronic pain: Secondary | ICD-10-CM

## 2021-06-07 LAB — GENETIC SCREENING ORDER

## 2021-06-07 MED ORDER — MELOXICAM 15 MG PO TABS
15.0000 mg | ORAL_TABLET | Freq: Every day | ORAL | 0 refills | Status: DC
  Filled 2021-06-07: qty 1, 1d supply, fill #0

## 2021-06-07 MED ORDER — MELOXICAM 15 MG PO TABS
15.0000 mg | ORAL_TABLET | Freq: Every day | ORAL | 3 refills | Status: DC
  Filled 2021-06-07: qty 90, 90d supply, fill #0
  Filled 2021-09-10: qty 90, 90d supply, fill #1
  Filled 2021-12-13: qty 90, 90d supply, fill #2
  Filled 2022-03-07: qty 90, 90d supply, fill #3

## 2021-06-07 MED ORDER — METHYLPHENIDATE HCL ER (OSM) 18 MG PO TBCR
EXTENDED_RELEASE_TABLET | ORAL | 0 refills | Status: DC
  Filled 2021-06-07: qty 90, 90d supply, fill #0

## 2021-06-07 NOTE — Addendum Note (Signed)
Addended by: Silverio Decamp on: 06/07/2021 02:45 PM   Modules accepted: Orders

## 2021-06-07 NOTE — Telephone Encounter (Signed)
[facility] called to clarify Meloxicam prescription was sent in for 1 tablet. Is this correct? If not, please send new Rx.

## 2021-06-07 NOTE — Progress Notes (Signed)
REFERRING PROVIDER: Hayden Pedro, PA-C [place],  [place] [address]  PRIMARY PROVIDER:  Silverio Decamp, MD  PRIMARY REASON FOR VISIT:  1. Family history of pancreatic cancer   2. Family history of breast cancer   3. Family history of prostate cancer   4. Mass of right chest wall      HISTORY OF PRESENT ILLNESS:   Mr. Nicol, a 35 y.o. male, was seen for a [place] cancer genetics consultation at the request of Shona Simpson, PA-C due to a personal and family history of cancer.  Mr. Ragone presents to clinic today to discuss the possibility of a hereditary predisposition to cancer, genetic testing, and to further clarify his future cancer risks, as well as potential cancer risks for family members.   In 2022, at the age of 52, Mr. Lantzy was diagnosed with a grade 1 chondrosarcoma of the chest wall. He underwent resection of this on 04/06/2021 and may have radiation.   CANCER HISTORY:  Oncology History   No history exists.    Past Medical History:  Diagnosis Date   ADD (attention deficit disorder)    Anxiety    Family history of breast cancer    Family history of pancreatic cancer    Family history of prostate cancer    Heart murmur    History of kidney stones    Hyperlipidemia    Raynaud disease    Seasonal allergies     Past Surgical History:  Procedure Laterality Date   CHEST WALL RECONSTRUCTION N/A 04/06/2021   Procedure: CHEST WALL RESECTION WITH MESH RECONSTRUCTION;  Surgeon: Lajuana Matte, MD;  Location: [facility];  Service: Thoracic;  Laterality: N/A;   INNER EAR SURGERY      Social History   Socioeconomic History   Marital status: Single    Spouse name: Not on file   Number of children: Not on file   Years of education: Not on file   Highest education level: Not on file  Occupational History   Not on file  Tobacco Use   Smoking status: Never   Smokeless tobacco: Never  Vaping Use   Vaping Use: Never used  Substance and  Sexual Activity   Alcohol use: No   Drug use: No   Sexual activity: Never  Other Topics Concern   Not on file  Social History Narrative   Not on file   Social Determinants of Health   Financial Resource Strain: Not on file  Food Insecurity: Not on file  Transportation Needs: Not on file  Physical Activity: Not on file  Stress: Not on file  Social Connections: Not on file     FAMILY HISTORY:  We obtained a detailed, 4-generation family history.  Significant diagnoses are listed below: Family History  Problem Relation Age of Onset   Hypertension Father    Prostate cancer Father        dx late 7s   Melanoma Father        dx 3s, on wrist   Prostate cancer Paternal Uncle        dx 59s   Melanoma Paternal Uncle    Breast cancer Maternal Grandmother    Pancreatic cancer Maternal Grandmother    Stroke Maternal Grandfather    Hypertension Paternal Grandmother    Other Paternal Grandmother        benign brain tumor   Alzheimer's disease Paternal Grandfather    Prostate cancer Paternal Grandfather    Mr.  Ramthun does not have children or siblings.   Mr. Ohern mother is living at 81 with no cancer history. Patient has 2 maternal uncles, no cancers. No cancers in maternal cousins. Maternal grandmother had breast young, unknown exact age of diagnosis. She also had pancreatic cancer in her 60s-70s and died from it. Maternal grandfather did not have cancer but did have a growth on his leg.   Mr. Franchi father had melanoma in his 33s on his wrist, prostate cancer in his late 59s and is living at 56. Patient had 1 paternal uncle. He had melanoma, unknown age, and prostate cancer in his 45s. He died of a heart attack in his early 33s. Paternal grandmother died in her mid 54s and had a benign brain tumor. Paternal grandfather had prostate cancer and died in his 72s. His father, patient's great grandfather, also had prostate cancer.  Mr. Granquist is unaware of previous family history of genetic  testing for hereditary cancer risks. There is no reported Ashkenazi Jewish ancestry. There is no known consanguinity.    GENETIC COUNSELING ASSESSMENT: Mr. Hanover is a 35 y.o. male with a family history of prostate cancer which is somewhat suggestive of a hereditary cancer syndrome and predisposition to cancer. We, therefore, discussed and recommended the following at today's visit.   DISCUSSION: We discussed that approximately 5-10% of prostate cancer is hereditary. Most cases of hereditary prostate cancer are associated with BRCA1/BRCA2 genes, although there are other genes associated with hereditary cancer as well. There are genes associated with breast and pancreatic cancer, as well as sarcoma. Li-Fraumeni syndrome is a condition that increases the risk for sarcoma. Cancers and risks are gene specific.  We discussed that testing is beneficial for several reasons including  knowing about other cancer risks, identifying potential screening and risk-reduction options that may be appropriate, and to understand if other family members could be at risk for cancer and allow them to undergo genetic testing.   We reviewed the characteristics, features and inheritance patterns of hereditary cancer syndromes. We also discussed genetic testing, including the appropriate family members to test, the process of testing, insurance coverage and turn-around-time for results. We discussed the implications of a negative, positive and/or variant of uncertain significant result. We recommended Mr. [place] Crane pursue genetic testing for the [facility] Multi-Cancer+RNA gene panel.   The Multi-Cancer Panel + RNA offered by [facility] includes sequencing and/or deletion duplication testing of the following 84 genes: AIP, ALK, APC, ATM, AXIN2,BAP1,  BARD1, BLM, BMPR1A, BRCA1, BRCA2, BRIP1, CASR, CDC73, CDH1, CDK4, CDKN1B, CDKN1C, CDKN2A (p14ARF), CDKN2A (p16INK4a), CEBPA, CHEK2, CTNNA1, DICER1, DIS3L2, EGFR (c.2369C>T, p.Thr790Met variant  only), EPCAM (Deletion/duplication testing only), FH, FLCN, GATA2, GPC3, GREM1 (Promoter region deletion/duplication testing only), HOXB13 (c.251G>A, p.Gly84Glu), HRAS, KIT, MAX, MEN1, MET, MITF (c.952G>A, p.Glu318Lys variant only), MLH1, MSH2, MSH3, MSH6, MUTYH, NBN, NF1, NF2, NTHL1, PALB2, PDGFRA, PHOX2B, PMS2, POLD1, POLE, POT1, PRKAR1A, PTCH1, PTEN, RAD50, RAD51C, RAD51D, RB1, RECQL4, RET, RUNX1, SDHAF2, SDHA (sequence changes only), SDHB, SDHC, SDHD, SMAD4, SMARCA4, SMARCB1, SMARCE1, STK11, SUFU, TERC, TERT, TMEM127, TP53, TSC1, TSC2, VHL, WRN and WT1.  Based on Mr. Hardge family history of cancer, he meets medical criteria for genetic testing. Despite that he meets criteria, he may still have an out of pocket cost. We discussed that if his out of pocket cost for testing is over $100, the laboratory will call and confirm whether he wants to proceed with testing.  If the out of pocket cost of testing is less than $100 he will  be billed by the genetic testing laboratory.   PLAN: After considering the risks, benefits, and limitations, Mr. Crisman provided informed consent to pursue genetic testing and the blood sample was sent to [facility] for analysis of the Multi-Cancer+RNA panel. Results should be available within approximately 2-3 weeks' time, at which point they will be disclosed by telephone to Mr. Nack, as will any additional recommendations warranted by these results. Mr. Kelnhofer will receive a summary of his genetic counseling visit and a copy of his results once available. This information will also be available in Epic.   Mr. Delbene questions were answered to his satisfaction today. Our contact information was provided should additional questions or concerns arise. Thank you for the referral and allowing [place] to share in the care of your patient.   Faith Rogue, MS, [facility] Genetic Counselor [email or web address] Phone: [phone]  The patient was seen for a total of 25 minutes  in face-to-face genetic counseling.  Patient was seen alone. Dr. Grayland Ormond was available for discussion regarding this case.   _______________________________________________________________________ For Office Staff:  Number of people involved in session: 1 Was an Intern/ student involved with case: no

## 2021-06-07 NOTE — Telephone Encounter (Signed)
Definitely not supposed to be 1 tablet, I sent in a 38-monthsupply.  Sorry about that.

## 2021-06-08 ENCOUNTER — Telehealth: Payer: Self-pay | Admitting: Radiation Oncology

## 2021-06-08 NOTE — Telephone Encounter (Signed)
I called the patient to let him know that his lateral margin was positive when reviewed by Dr. Melina Copa and that as a result, Dr. Lisbeth Renshaw recommends proceeding with 7 weeks of radiotherapy to the chest wall. He will receive a call from simulation in order to proceed with treatment planning.

## 2021-06-16 ENCOUNTER — Other Ambulatory Visit (HOSPITAL_BASED_OUTPATIENT_CLINIC_OR_DEPARTMENT_OTHER): Payer: Self-pay

## 2021-06-17 ENCOUNTER — Other Ambulatory Visit: Payer: Self-pay

## 2021-06-17 ENCOUNTER — Ambulatory Visit
Admission: RE | Admit: 2021-06-17 | Discharge: 2021-06-17 | Disposition: A | Discharge status: Home / Self Care | Payer: No Typology Code available for payment source | Source: Ambulatory Visit | Attending: Radiation Oncology | Admitting: Radiation Oncology

## 2021-06-17 DIAGNOSIS — C419 Malignant neoplasm of bone and articular cartilage, unspecified: Secondary | ICD-10-CM | POA: Diagnosis present

## 2021-06-17 DIAGNOSIS — Z510 Encounter for antineoplastic radiation therapy: Secondary | ICD-10-CM | POA: Diagnosis not present

## 2021-06-17 NOTE — Progress Notes (Signed)
I saw the patient today at simulation.  We reviewed consent.  He is still having some concerns about his postoperative effusion, he can become short of breath with exertion.  He has an appointment next week with Dr. Kipp Brood.  We will send screenshots of his CT simulation images today to Dr. Kipp Brood so he has that as well since they are not loaded in PACS.

## 2021-06-23 ENCOUNTER — Other Ambulatory Visit: Payer: Self-pay | Admitting: Thoracic Surgery (Cardiothoracic Vascular Surgery)

## 2021-06-23 DIAGNOSIS — R222 Localized swelling, mass and lump, trunk: Secondary | ICD-10-CM

## 2021-06-24 ENCOUNTER — Other Ambulatory Visit: Payer: Self-pay

## 2021-06-24 ENCOUNTER — Ambulatory Visit
Admission: RE | Admit: 2021-06-24 | Discharge: 2021-06-24 | Disposition: A | Discharge status: Home / Self Care | Payer: No Typology Code available for payment source | Source: Ambulatory Visit | Attending: Radiation Oncology | Admitting: Radiation Oncology

## 2021-06-24 DIAGNOSIS — Z510 Encounter for antineoplastic radiation therapy: Secondary | ICD-10-CM | POA: Diagnosis not present

## 2021-06-24 NOTE — Progress Notes (Signed)
Pt here for patient teaching.  Pt given Radiation and You booklet, skin care instructions, and Sonafine.  Reviewed areas of pertinence such as fatigue, hair loss, and skin changes . Pt able to give teach back of to pat skin and use unscented/gentle soap,apply Sonafine bid and avoid applying anything to skin within 4 hours of treatment. Pt verbalizes understanding of information given and will contact nursing with any questions or concerns.     [email or web address]/[email or web address]  Gloriajean Dell. Leonie Green, BSN

## 2021-06-25 ENCOUNTER — Ambulatory Visit
Admission: RE | Admit: 2021-06-25 | Discharge: 2021-06-25 | Disposition: A | Discharge status: Home / Self Care | Payer: No Typology Code available for payment source | Source: Ambulatory Visit | Attending: Radiation Oncology | Admitting: Radiation Oncology

## 2021-06-25 ENCOUNTER — Ambulatory Visit
Admission: RE | Admit: 2021-06-25 | Discharge: 2021-06-25 | Disposition: A | Discharge status: Home / Self Care | Payer: No Typology Code available for payment source | Source: Ambulatory Visit | Attending: Thoracic Surgery (Cardiothoracic Vascular Surgery) | Admitting: Thoracic Surgery (Cardiothoracic Vascular Surgery)

## 2021-06-25 ENCOUNTER — Encounter: Payer: Self-pay | Admitting: Thoracic Surgery (Cardiothoracic Vascular Surgery)

## 2021-06-25 ENCOUNTER — Ambulatory Visit (INDEPENDENT_AMBULATORY_CARE_PROVIDER_SITE_OTHER): Payer: Self-pay | Admitting: Thoracic Surgery (Cardiothoracic Vascular Surgery)

## 2021-06-25 VITALS — BP 131/84 | HR 68 | Resp 20 | Ht 69.0 in | Wt 231.0 lb

## 2021-06-25 DIAGNOSIS — R222 Localized swelling, mass and lump, trunk: Secondary | ICD-10-CM

## 2021-06-25 DIAGNOSIS — Z09 Encounter for follow-up examination after completed treatment for conditions other than malignant neoplasm: Secondary | ICD-10-CM

## 2021-06-25 DIAGNOSIS — Z510 Encounter for antineoplastic radiation therapy: Secondary | ICD-10-CM | POA: Diagnosis not present

## 2021-06-25 MED ORDER — SONAFINE EX EMUL
1.0000 "application " | Freq: Once | CUTANEOUS | Status: AC
  Administered 2021-06-25: 1 via TOPICAL

## 2021-06-25 NOTE — Progress Notes (Signed)
[address]       [place] [address]             [phone]        [name] [name] [facility] Medical Record #[id number] Date of Birth: 18-Oct-1985  Referring: Silverio Decamp,* Primary Care: Silverio Decamp, MD Primary Cardiologist:None  Reason for visit:   follow-up  History of Present Illness:     [name] [name] comes in in follow-up.  He underwent chest wall resection in July 2022.  He has met with radiation oncology and they have elected to treat him as well.  He does also complain of some persistent shortness of breath.  Physical Exam: BP 131/84 (BP Location: Left Arm, Patient Position: Sitting)   Pulse 68   Resp 20   Ht $R'5\' 9"'jq$  (1.753 m)   Wt 231 lb (104.8 kg)   SpO2 100% Comment: RA  BMI 34.11 kg/m   Alert NAD No peripheral edema   Diagnostic Studies & Laboratory data: CXR: Small right-sided pleural effusion.     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  He is currently scheduled to undergo adjuvant radiation.  On review of his chest x-ray he does have a small right-sided effusion which has persisted.  He would like this drained given his persistent shortness of breath.  I have referred him to Dr. Valeta Harms for thoracentesis.  I will see him back in 6 months with a chest CT.   [name] [name] [name] [name] 06/25/2021 6:01 PM

## 2021-06-28 ENCOUNTER — Ambulatory Visit: Admission: RE | Admit: 2021-06-28 | Payer: No Typology Code available for payment source | Source: Ambulatory Visit

## 2021-06-29 ENCOUNTER — Ambulatory Visit
Admission: RE | Admit: 2021-06-29 | Discharge: 2021-06-29 | Disposition: A | Discharge status: Home / Self Care | Payer: No Typology Code available for payment source | Source: Ambulatory Visit | Attending: Radiation Oncology | Admitting: Radiation Oncology

## 2021-06-29 ENCOUNTER — Other Ambulatory Visit: Payer: Self-pay

## 2021-06-29 DIAGNOSIS — Z510 Encounter for antineoplastic radiation therapy: Secondary | ICD-10-CM | POA: Diagnosis not present

## 2021-06-29 DIAGNOSIS — C419 Malignant neoplasm of bone and articular cartilage, unspecified: Secondary | ICD-10-CM | POA: Diagnosis present

## 2021-06-30 ENCOUNTER — Ambulatory Visit: Payer: Self-pay | Admitting: Licensed Clinical Social Worker

## 2021-06-30 ENCOUNTER — Ambulatory Visit
Admission: RE | Admit: 2021-06-30 | Discharge: 2021-06-30 | Disposition: A | Discharge status: Home / Self Care | Payer: No Typology Code available for payment source | Source: Ambulatory Visit | Attending: Radiation Oncology | Admitting: Radiation Oncology

## 2021-06-30 ENCOUNTER — Telehealth: Payer: Self-pay | Admitting: Licensed Clinical Social Worker

## 2021-06-30 ENCOUNTER — Encounter: Payer: Self-pay | Admitting: Licensed Clinical Social Worker

## 2021-06-30 DIAGNOSIS — R222 Localized swelling, mass and lump, trunk: Secondary | ICD-10-CM

## 2021-06-30 DIAGNOSIS — Z510 Encounter for antineoplastic radiation therapy: Secondary | ICD-10-CM | POA: Diagnosis not present

## 2021-06-30 DIAGNOSIS — Z1379 Encounter for other screening for genetic and chromosomal anomalies: Secondary | ICD-10-CM | POA: Insufficient documentation

## 2021-06-30 DIAGNOSIS — Z803 Family history of malignant neoplasm of breast: Secondary | ICD-10-CM

## 2021-06-30 DIAGNOSIS — Z800 Family history of malignant neoplasm of digestive organs: Secondary | ICD-10-CM

## 2021-06-30 NOTE — Telephone Encounter (Signed)
Revealed negative genetic testing.  Revealed that a VUS in NF2 and PALB2 were identified.  We discussed that we do not know why he has cancer or why there is cancer in the family. It could be due to a different gene that we are not testing, or something our current technology cannot pick up.  It will be important for him to keep in contact with genetics to learn if additional testing may be needed in the future.

## 2021-06-30 NOTE — Progress Notes (Signed)
HPI:  [name] [name] was previously seen in the [facility] due to a personal and family history of cancer and concerns regarding a hereditary predisposition to cancer. Please refer to our prior cancer genetics clinic note for more information regarding our discussion, assessment and recommendations, at the time. [name] [name] recent genetic test results were disclosed to him, as were recommendations warranted by these results. These results and recommendations are discussed in more detail below.  CANCER HISTORY:  Oncology History   No history exists.    FAMILY HISTORY:  We obtained a detailed, 4-generation family history.  Significant diagnoses are listed below: Family History  Problem Relation Age of Onset   Hypertension Father    Prostate cancer Father        dx late 38s   Melanoma Father        dx 41s, on wrist   Prostate cancer Paternal Uncle        dx 81s   Melanoma Paternal Uncle    Breast cancer Maternal Grandmother    Pancreatic cancer Maternal Grandmother    Stroke Maternal Grandfather    Hypertension Paternal Grandmother    Other Paternal Grandmother        benign brain tumor   Alzheimer's disease Paternal Grandfather    Prostate cancer Paternal Grandfather       [name] [name] does not have children or siblings.    [name] [name] mother is living at 56 with no cancer history. Patient has 2 maternal uncles, no cancers. No cancers in maternal cousins. Maternal grandmother had breast young, unknown exact age of diagnosis. She also had pancreatic cancer in her 60s-70s and died from it. Maternal grandfather did not have cancer but did have a growth on his leg.    [name] [name] father had melanoma in his 68s on his wrist, prostate cancer in his late 65s and is living at 18. Patient had 1 paternal uncle. He had melanoma, unknown age, and prostate cancer in his 9s. He died of a heart attack in his early 33s. Paternal grandmother died in her mid 23s and had a benign brain  tumor. Paternal grandfather had prostate cancer and died in his 61s. His father, patient's great grandfather, also had prostate cancer.   [name] [name] is unaware of previous family history of genetic testing for hereditary cancer risks. There is no reported Ashkenazi Jewish ancestry. There is no known consanguinity.     GENETIC TEST RESULTS: Genetic testing reported out on 06/25/2021 through the [facility] Multi-Cancer+RNA cancer panel found no pathogenic mutations.   The Multi-Cancer Panel + RNA offered by [facility] includes sequencing and/or deletion duplication testing of the following 84 genes: AIP, ALK, APC, ATM, AXIN2,BAP1,  BARD1, BLM, BMPR1A, BRCA1, BRCA2, BRIP1, CASR, CDC73, CDH1, CDK4, CDKN1B, CDKN1C, CDKN2A (p14ARF), CDKN2A (p16INK4a), CEBPA, CHEK2, CTNNA1, DICER1, DIS3L2, EGFR (c.2369C>T, p.Thr790Met variant only), EPCAM (Deletion/duplication testing only), FH, FLCN, GATA2, GPC3, GREM1 (Promoter region deletion/duplication testing only), HOXB13 (c.251G>A, p.Gly84Glu), HRAS, KIT, MAX, MEN1, MET, MITF (c.952G>A, p.Glu318Lys variant only), MLH1, MSH2, MSH3, MSH6, MUTYH, NBN, NF1, NF2, NTHL1, PALB2, PDGFRA, PHOX2B, PMS2, POLD1, POLE, POT1, PRKAR1A, PTCH1, PTEN, RAD50, RAD51C, RAD51D, RB1, RECQL4, RET, RUNX1, SDHAF2, SDHA (sequence changes only), SDHB, SDHC, SDHD, SMAD4, SMARCA4, SMARCB1, SMARCE1, STK11, SUFU, TERC, TERT, TMEM127, TP53, TSC1, TSC2, VHL, WRN and WT1.   The test report has been scanned into EPIC and is located under the Molecular Pathology section of the Results Review tab.  A portion of the result report is  included below for reference.     We discussed that because current genetic testing is not perfect, it is possible there may be a gene mutation in one of these genes that current testing cannot detect, but that chance is small.  There could be another gene that has not yet been discovered, or that we have not yet tested, that is responsible for the cancer diagnoses in the family. It is  also possible there is a hereditary cause for the cancer in the family that [name] [name] did not inherit and therefore was not identified in his testing.  Therefore, it is important to remain in touch with cancer genetics in the future so that we can continue to offer [name] [name] the most up to date genetic testing.   Genetic testing did identify 2 Variants of uncertain significance (VUS) - one in the NF2 gene called c.1702_1703del, a second in the PALb2 gene called c.3296C>G. At this time, it is unknown if these variants are associated with increased cancer risk or if they are normal findings, but most variants such as these get reclassified to being inconsequential. They should not be used to make medical management decisions. With time, we suspect the lab will determine the significance of these variants, if any. If we do learn more about them, we will try to contact [name] [name] to discuss it further. However, it is important to stay in touch with [place] periodically and keep the address and phone number up to date.  ADDITIONAL GENETIC TESTING: We discussed with [name] [name] that his genetic testing was fairly extensive.  If there are genes identified to increase cancer risk that can be analyzed in the future, we would be happy to discuss and coordinate this testing at that time.    CANCER SCREENING RECOMMENDATIONS: [name] [name] test result is considered negative (normal).  This means that we have not identified a hereditary cause for his  personal and family history of cancer at this time. Most cancers happen by chance and this negative test suggests that his cancer may fall into this category.    While reassuring, this does not definitively rule out a hereditary predisposition to cancer. It is still possible that there could be genetic mutations that are undetectable by current technology. There could be genetic mutations in genes that have not been tested or identified to increase cancer risk.  Therefore, it is  recommended he continue to follow the cancer management and screening guidelines provided by his oncology and primary healthcare provider.   An individual's cancer risk and medical management are not determined by genetic test results alone. Overall cancer risk assessment incorporates additional factors, including personal medical history, family history, and any available genetic information that may result in a personalized plan for cancer prevention and surveillance.  RECOMMENDATIONS FOR FAMILY MEMBERS:  Relatives in this family might be at some increased risk of developing cancer, over the general population risk, simply due to the family history of cancer.  We recommended male relatives in this family have a yearly mammogram beginning at age 83, or 68 years younger than the earliest onset of cancer, an annual clinical breast exam, and perform monthly breast self-exams. Male relatives in this family should also have a gynecological exam as recommended by their primary provider.  All family members should be referred for colonoscopy starting at age 60.    It is also possible there is a hereditary cause for the cancer in [name] [name] family that he did not inherit  and therefore was not identified in him.  Based on [name] [name] family history, we recommended maternal/paternal relatives have genetic counseling and testing. [name] [name] will let us know if we can be of any assistance in coordinating genetic counseling and/or testing for these family members.  FOLLOW-UP: Lastly, we discussed with [name] [name] that cancer genetics is a rapidly advancing field and it is possible that new genetic tests will be appropriate for him and/or his family members in the future. We encouraged him to remain in contact with cancer genetics on an annual basis so we can update his personal and family histories and let him know of advances in cancer genetics that may benefit this family.   Our contact number was provided. [name] [name]  questions were answered to his satisfaction, and he knows he is welcome to call us at anytime with additional questions or concerns.   Faith Rogue, MS, [facility] Genetic Counselor [email or web address] Phone: [phone]

## 2021-07-01 ENCOUNTER — Ambulatory Visit
Admission: RE | Admit: 2021-07-01 | Discharge: 2021-07-01 | Disposition: A | Discharge status: Home / Self Care | Payer: No Typology Code available for payment source | Source: Ambulatory Visit | Attending: Radiation Oncology | Admitting: Radiation Oncology

## 2021-07-01 ENCOUNTER — Other Ambulatory Visit: Payer: Self-pay

## 2021-07-01 DIAGNOSIS — Z510 Encounter for antineoplastic radiation therapy: Secondary | ICD-10-CM | POA: Diagnosis not present

## 2021-07-02 ENCOUNTER — Ambulatory Visit: Payer: No Typology Code available for payment source | Attending: Internal Medicine

## 2021-07-02 ENCOUNTER — Ambulatory Visit
Admission: RE | Admit: 2021-07-02 | Discharge: 2021-07-02 | Disposition: A | Discharge status: Home / Self Care | Payer: No Typology Code available for payment source | Source: Ambulatory Visit | Attending: Radiation Oncology | Admitting: Radiation Oncology

## 2021-07-02 DIAGNOSIS — Z23 Encounter for immunization: Secondary | ICD-10-CM

## 2021-07-02 DIAGNOSIS — Z510 Encounter for antineoplastic radiation therapy: Secondary | ICD-10-CM | POA: Diagnosis not present

## 2021-07-02 NOTE — Progress Notes (Signed)
   Covid-19 Vaccination Clinic  Name:  [name] [name]    MRN: [id number] DOB: Mar 25, 1986  07/02/2021  [name] was observed post Covid-19 immunization for 15 minutes without incident. He was provided with Vaccine Information Sheet and instruction to access the V-Safe system.   [name] was instructed to call 911 with any severe reactions post vaccine: Difficulty breathing  Swelling of face and throat  A fast heartbeat  A bad rash all over body  Dizziness and weakness

## 2021-07-05 ENCOUNTER — Telehealth: Payer: Self-pay

## 2021-07-05 ENCOUNTER — Ambulatory Visit
Admission: RE | Admit: 2021-07-05 | Discharge: 2021-07-05 | Disposition: A | Discharge status: Home / Self Care | Payer: No Typology Code available for payment source | Source: Ambulatory Visit | Attending: Radiation Oncology | Admitting: Radiation Oncology

## 2021-07-05 DIAGNOSIS — Z510 Encounter for antineoplastic radiation therapy: Secondary | ICD-10-CM | POA: Diagnosis not present

## 2021-07-05 NOTE — Telephone Encounter (Signed)
Called and spoke with patient to let him know information about [facility]. Advised patient it will be on Wednesday 07/07/21 at 2 pm and he is to arrive to admitting at 1 pm. Patient expressed understanding. Will route as FYI ONLY. Nothing further needed at this time.

## 2021-07-05 NOTE — Telephone Encounter (Signed)
ATC call ENDO asked to call back in half an hour

## 2021-07-05 NOTE — Telephone Encounter (Signed)
-----   Message from Garner Nash, DO sent at 07/05/2021 10:07 AM EDT ----- Regarding: Needs Lynnell Catalan,   I dont see that this has been scheduled with me. I am out of town next week. Can you please scheduled with Dr. Tamala Julian [facility] this week Tues/wed/thurs afternoon.   Thanks,  Leory Plowman      ----- Message ----- From: Synthia Innocent Sent: 06/25/2021  10:28 AM EDT To: Bradley L Icard, DO  Good Morning!  Referral placed on this patient, Dr Kipp Brood is requesting a thoracentesis.  Thanks

## 2021-07-06 ENCOUNTER — Encounter: Payer: Self-pay | Admitting: Emergency Medicine

## 2021-07-06 ENCOUNTER — Other Ambulatory Visit: Payer: Self-pay

## 2021-07-06 ENCOUNTER — Ambulatory Visit
Admission: RE | Admit: 2021-07-06 | Discharge: 2021-07-06 | Disposition: A | Discharge status: Home / Self Care | Payer: No Typology Code available for payment source | Source: Ambulatory Visit | Attending: Radiation Oncology | Admitting: Radiation Oncology

## 2021-07-06 ENCOUNTER — Emergency Department: Admit: 2021-07-06 | Discharge status: Absent without leave | Payer: Self-pay

## 2021-07-06 ENCOUNTER — Emergency Department
Admission: EM | Admit: 2021-07-06 | Discharge: 2021-07-06 | Disposition: A | Discharge status: Home / Self Care | Payer: No Typology Code available for payment source | Source: Home / Self Care

## 2021-07-06 ENCOUNTER — Other Ambulatory Visit (HOSPITAL_BASED_OUTPATIENT_CLINIC_OR_DEPARTMENT_OTHER): Payer: Self-pay

## 2021-07-06 DIAGNOSIS — Z510 Encounter for antineoplastic radiation therapy: Secondary | ICD-10-CM | POA: Diagnosis not present

## 2021-07-06 DIAGNOSIS — H9202 Otalgia, left ear: Secondary | ICD-10-CM

## 2021-07-06 MED ORDER — FLUTICASONE PROPIONATE 50 MCG/ACT NA SUSP
1.0000 | Freq: Every day | NASAL | 0 refills | Status: AC | PRN
  Filled 2021-07-06: qty 16, 60d supply, fill #0

## 2021-07-06 MED ORDER — NEOMYCIN-POLYMYXIN-HC 3.5-10000-1 OT SUSP
4.0000 [drp] | Freq: Four times a day (QID) | OTIC | 0 refills | Status: DC
  Filled 2021-07-06: qty 10, 13d supply, fill #0

## 2021-07-06 NOTE — Discharge Instructions (Addendum)
Use your Flonase twice a day for 3 days then once a day until your symptoms resolve Use eardrops for 5 to 7 days until the ear pain improves Follow-up with your primary care doctor

## 2021-07-06 NOTE — Telephone Encounter (Signed)
Star from [employer] called and states patients insurance requires a pre-authorization for surgery tomorrow 10/12. Star also mentioned that patient may need a pre-certification from primary care. Star was unsure who's responsibility that was and that it may be the patients responsibility. Star's direct number is [phone] ext: [phone].     Please advise.

## 2021-07-06 NOTE — ED Triage Notes (Addendum)
Patient presents to Urgent Care with complaints of left ear pain since 2 days ago. Patient reports pain in the left ear radiating into the jaw. Denies any decrease hearing. Feels like fluid in the ear but nothing coming out. Denies any fever or chills.   Taking Advil for pain

## 2021-07-07 ENCOUNTER — Other Ambulatory Visit (HOSPITAL_BASED_OUTPATIENT_CLINIC_OR_DEPARTMENT_OTHER): Payer: Self-pay

## 2021-07-07 ENCOUNTER — Ambulatory Visit (HOSPITAL_COMMUNITY)
Admission: RE | Admit: 2021-07-07 | Discharge: 2021-07-07 | Disposition: A | Discharge status: Home / Self Care | Payer: No Typology Code available for payment source | Attending: Internal Medicine | Admitting: Internal Medicine

## 2021-07-07 ENCOUNTER — Encounter (HOSPITAL_COMMUNITY)
Admission: RE | Disposition: A | Discharge status: Home / Self Care | Payer: Self-pay | Source: Home / Self Care | Attending: Internal Medicine

## 2021-07-07 ENCOUNTER — Telehealth: Payer: Self-pay | Admitting: Internal Medicine

## 2021-07-07 ENCOUNTER — Other Ambulatory Visit: Payer: Self-pay | Admitting: Sports Medicine

## 2021-07-07 ENCOUNTER — Ambulatory Visit
Admission: RE | Admit: 2021-07-07 | Discharge: 2021-07-07 | Disposition: A | Discharge status: Home / Self Care | Payer: No Typology Code available for payment source | Source: Ambulatory Visit | Attending: Radiation Oncology | Admitting: Radiation Oncology

## 2021-07-07 ENCOUNTER — Other Ambulatory Visit: Payer: Self-pay

## 2021-07-07 ENCOUNTER — Encounter (HOSPITAL_COMMUNITY): Payer: Self-pay | Admitting: Internal Medicine

## 2021-07-07 DIAGNOSIS — R0609 Other forms of dyspnea: Secondary | ICD-10-CM

## 2021-07-07 DIAGNOSIS — Z791 Long term (current) use of non-steroidal anti-inflammatories (NSAID): Secondary | ICD-10-CM | POA: Diagnosis not present

## 2021-07-07 DIAGNOSIS — Z85830 Personal history of malignant neoplasm of bone: Secondary | ICD-10-CM | POA: Diagnosis not present

## 2021-07-07 DIAGNOSIS — R9389 Abnormal findings on diagnostic imaging of other specified body structures: Secondary | ICD-10-CM | POA: Insufficient documentation

## 2021-07-07 DIAGNOSIS — Z79899 Other long term (current) drug therapy: Secondary | ICD-10-CM | POA: Diagnosis not present

## 2021-07-07 DIAGNOSIS — Z510 Encounter for antineoplastic radiation therapy: Secondary | ICD-10-CM | POA: Diagnosis not present

## 2021-07-07 DIAGNOSIS — Z539 Procedure and treatment not carried out, unspecified reason: Secondary | ICD-10-CM | POA: Diagnosis not present

## 2021-07-07 DIAGNOSIS — R635 Abnormal weight gain: Secondary | ICD-10-CM

## 2021-07-07 SURGERY — CANCELLED PROCEDURE

## 2021-07-07 MED ORDER — PHENTERMINE HCL 37.5 MG PO TABS
18.7500 mg | ORAL_TABLET | Freq: Every day | ORAL | 5 refills | Status: DC
Start: 2021-07-07 — End: 2021-10-19
  Filled 2021-07-07: qty 15, 30d supply, fill #0
  Filled 2021-08-13: qty 15, 30d supply, fill #1
  Filled 2021-09-10: qty 15, 30d supply, fill #2
  Filled 2021-10-10: qty 15, 30d supply, fill #3

## 2021-07-07 MED ORDER — LIDOCAINE HCL (PF) 1 % IJ SOLN
INTRAMUSCULAR | Status: AC
  Filled 2021-07-07: qty 30

## 2021-07-07 MED ORDER — PHENTERMINE HCL 37.5 MG PO TABS: 18.7500 mg | ORAL_TABLET | Freq: Every day | ORAL | 5 refills | Status: DC

## 2021-07-07 NOTE — Progress Notes (Signed)
Patient admitted to endoscopy for thoracentesis.  Dr. Tamala Julian to room, evaluated patient with ultrasound.  Per MD patient would not need thoracentesis and could be discharged home.

## 2021-07-07 NOTE — H&P (Signed)
Synopsis: Referred in Oct 2022 for abnormal CXR by No ref. provider found  Assessment & Plan:  Problem 1 Abnormal CXR: s/p chest well resection July 2022 for atypical cartilaginous tumor aka grade 1 chondrosarcoma.  Margins negative.  Received adjuvant radiation.  [place] today reveals no fluid but there is a complex appearing collection on top of R diaphragm correlating with loss of R hemidiaphragm on CXR, suspect this is just benign postop scarring or coagulated blood.  Will get a CT to confirm.  Discussed with patient and sent a message to Dr. Kipp Brood.  -  CT chest, I will call patient with results  MDM  I reviewed prior external note(s) from Dr. Kipp Brood  I reviewed the result(s) of pathology report 06/03/21  I have performed US and ordered CT chest  Review of patient's 06/25/21 CXR revealed blunting of R costophrenic angle and maybe an anterior shadow. The patient's images have been independently reviewed by me.     End of visit medications: No current facility-administered medications for this encounter.  Current Outpatient Medications:    amLODipine (NORVASC) 2.5 MG tablet, TAKE 1 TABLET BY MOUTH ONCE DAILY, Disp: 90 tablet, Rfl: 2   fluticasone (FLONASE) 50 MCG/ACT nasal spray, Place 1 spray into both nostrils daily as needed for allergies or rhinitis., Disp: 16 g, Rfl: 0   ibuprofen (ADVIL) 200 MG tablet, Take 200-400 mg by mouth every 6 (six) hours as needed for moderate pain., Disp: , Rfl:    meloxicam (MOBIC) 15 MG tablet, Take 1 tablet (15 mg total) by mouth daily., Disp: 90 tablet, Rfl: 3   methylphenidate (CONCERTA) 18 MG PO CR tablet, Take 1 tablet (18 mg total) by mouth daily., Disp: , Rfl:    neomycin-polymyxin-hydrocortisone (CORTISPORIN) 3.5-10000-1 OTIC suspension, Place 4 drops into the left ear 4 (four) times daily., Disp: 10 mL, Rfl: 0   niacin 500 MG tablet, Take 500 mg by mouth at bedtime., Disp: , Rfl:    vortioxetine HBr (TRINTELLIX) 10 MG TABS tablet, Take 1  tablet (10 mg total) by mouth daily., Disp: 30 tablet, Rfl: 11   Wound Dressings (SONAFINE), Apply 1 application topically 2 (two) times daily., Disp: , Rfl:    phentermine (ADIPEX-P) 37.5 MG tablet, Take 0.5 tablets (18.75 mg total) by mouth daily before breakfast., Disp: 15 tablet, Rfl: 5   Candee Furbish, MD [facility] 07/07/2021 4:33 PM    Subjective:   PATIENT ID: [name] [name] GENDER: male DOB: 12-Mar-1986, MRN: [id number]  SOB   HPI 35 year old man w/o much PMH who presented with renal colic with CT abdomen incidentally showing chest wall mass.  Eventual resection revealed a atypical low grade chondrosarcoma with negative margins.  Serial CXRs post op have revealed a possible persistent R pleural fluid collection for which he is referred to pulmonology.  He still has   Ancillary information including prior medications, full medical/surgical/family/social histoies, and PFTs (when available) are listed below and have been reviewed.   ROS + symptoms in bold Fevers, chills, weight loss Nausea, vomiting, diarrhea Shortness of breath, wheezing, cough Chest pain, palpitations, lower ext edema   Objective:   Vitals:   07/07/21 1315  BP: (!) 158/91  Pulse: 90  Resp: (!) 22  Temp: (!) 96.5 F (35.8 C)  TempSrc: Temporal  SpO2: 100%   100% on RA BMI Readings from Last 3 Encounters:  06/25/21 34.11 kg/m  06/03/21 34.28 kg/m  05/21/21 33.67 kg/m   Wt Readings  from Last 3 Encounters:  06/25/21 104.8 kg  06/03/21 105.3 kg  05/21/21 103.4 kg    GEN: young man in no acute distress HEENT: trachea midline, mucus membranes moist CV: Regular rate and rhythm, extremities are warm PULM: diminished R base otherwise clear, no fluid on [place], one area of what appears to be scarring vs. Blood clot above diaphragm GI: Soft, +BS EXT: no edema SKIN: no rashes, incision sites healing well NEURO: Moves all 4 extremities   Ancillary Information    Past  Medical History:  Diagnosis Date   ADD (attention deficit disorder)    Anxiety    Family history of breast cancer    Family history of pancreatic cancer    Family history of prostate cancer    Heart murmur    History of kidney stones    Hyperlipidemia    Raynaud disease    Seasonal allergies      Family History  Problem Relation Age of Onset   Hypertension Father    Prostate cancer Father        dx late 59s   Melanoma Father        dx 65s, on wrist   Prostate cancer Paternal Uncle        dx 22s   Melanoma Paternal Uncle    Breast cancer Maternal Grandmother    Pancreatic cancer Maternal Grandmother    Stroke Maternal Grandfather    Hypertension Paternal Grandmother    Other Paternal Grandmother        benign brain tumor   Alzheimer's disease Paternal Grandfather    Prostate cancer Paternal Grandfather      Past Surgical History:  Procedure Laterality Date   CHEST WALL RECONSTRUCTION N/A 04/06/2021   Procedure: CHEST WALL RESECTION WITH MESH RECONSTRUCTION;  Surgeon: Lajuana Matte, MD;  Location: [facility] OR;  Service: Thoracic;  Laterality: N/A;   INNER EAR SURGERY      Social History   Socioeconomic History   Marital status: Single    Spouse name: Not on file   Number of children: Not on file   Years of education: Not on file   Highest education level: Not on file  Occupational History   Not on file  Tobacco Use   Smoking status: Never   Smokeless tobacco: Never  Vaping Use   Vaping Use: Never used  Substance and Sexual Activity   Alcohol use: No   Drug use: No   Sexual activity: Never  Other Topics Concern   Not on file  Social History Narrative   Not on file   Social Determinants of Health   Financial Resource Strain: Not on file  Food Insecurity: Not on file  Transportation Needs: Not on file  Physical Activity: Not on file  Stress: Not on file  Social Connections: Not on file  Intimate Partner Violence: Not At Risk   Fear of Current or  Ex-Partner: No   Emotionally Abused: No   Physically Abused: No   Sexually Abused: No     No Known Allergies   CBC    Component Value Date/Time   WBC 16.7 (H) 04/08/2021 0018   RBC 4.54 04/08/2021 0018   HGB 13.5 04/08/2021 0018   HCT 41.1 04/08/2021 0018   PLT 232 04/08/2021 0018   MCV 90.5 04/08/2021 0018   MCH 29.7 04/08/2021 0018   MCHC 32.8 04/08/2021 0018   RDW 12.4 04/08/2021 0018    Pulmonary Functions Testing Results: No flowsheet data found.

## 2021-07-07 NOTE — Telephone Encounter (Signed)
Seen and examined (see separate consult note). Ordering CT to be done in [place] to confirm R sided scarring ?resolving hemothorax.  Erskine Emery MD PCCM

## 2021-07-07 NOTE — Telephone Encounter (Signed)
Rx "sent" to pharmacy earlier today was marked "no print" and not received at the pharmacy.  Charyl Bigger, CMA

## 2021-07-08 ENCOUNTER — Ambulatory Visit
Admission: RE | Admit: 2021-07-08 | Discharge: 2021-07-08 | Disposition: A | Discharge status: Home / Self Care | Payer: No Typology Code available for payment source | Source: Ambulatory Visit | Attending: Radiation Oncology | Admitting: Radiation Oncology

## 2021-07-08 ENCOUNTER — Other Ambulatory Visit: Payer: Self-pay

## 2021-07-08 DIAGNOSIS — Z510 Encounter for antineoplastic radiation therapy: Secondary | ICD-10-CM | POA: Diagnosis not present

## 2021-07-09 ENCOUNTER — Ambulatory Visit
Admission: RE | Admit: 2021-07-09 | Discharge: 2021-07-09 | Disposition: A | Discharge status: Home / Self Care | Payer: No Typology Code available for payment source | Source: Ambulatory Visit | Attending: Radiation Oncology | Admitting: Radiation Oncology

## 2021-07-09 DIAGNOSIS — Z510 Encounter for antineoplastic radiation therapy: Secondary | ICD-10-CM | POA: Diagnosis not present

## 2021-07-12 ENCOUNTER — Ambulatory Visit
Admission: RE | Admit: 2021-07-12 | Discharge: 2021-07-12 | Disposition: A | Discharge status: Home / Self Care | Payer: No Typology Code available for payment source | Source: Ambulatory Visit | Attending: Radiation Oncology | Admitting: Radiation Oncology

## 2021-07-12 ENCOUNTER — Other Ambulatory Visit: Payer: Self-pay

## 2021-07-12 DIAGNOSIS — Z510 Encounter for antineoplastic radiation therapy: Secondary | ICD-10-CM | POA: Diagnosis not present

## 2021-07-13 ENCOUNTER — Ambulatory Visit
Admission: RE | Admit: 2021-07-13 | Discharge: 2021-07-13 | Disposition: A | Discharge status: Home / Self Care | Payer: No Typology Code available for payment source | Source: Ambulatory Visit | Attending: Radiation Oncology | Admitting: Radiation Oncology

## 2021-07-13 ENCOUNTER — Other Ambulatory Visit (HOSPITAL_BASED_OUTPATIENT_CLINIC_OR_DEPARTMENT_OTHER): Payer: Self-pay

## 2021-07-13 DIAGNOSIS — Z510 Encounter for antineoplastic radiation therapy: Secondary | ICD-10-CM | POA: Diagnosis not present

## 2021-07-13 MED ORDER — PFIZER COVID-19 VAC BIVALENT 30 MCG/0.3ML IM SUSP
INTRAMUSCULAR | 0 refills | Status: DC
  Filled 2021-07-13: qty 0.3, 1d supply, fill #0

## 2021-07-14 ENCOUNTER — Other Ambulatory Visit: Payer: Self-pay

## 2021-07-14 ENCOUNTER — Ambulatory Visit
Admission: RE | Admit: 2021-07-14 | Discharge: 2021-07-14 | Disposition: A | Discharge status: Home / Self Care | Payer: No Typology Code available for payment source | Source: Ambulatory Visit | Attending: Radiation Oncology | Admitting: Radiation Oncology

## 2021-07-14 DIAGNOSIS — Z510 Encounter for antineoplastic radiation therapy: Secondary | ICD-10-CM | POA: Diagnosis not present

## 2021-07-15 ENCOUNTER — Ambulatory Visit
Admission: RE | Admit: 2021-07-15 | Discharge: 2021-07-15 | Disposition: A | Discharge status: Home / Self Care | Payer: No Typology Code available for payment source | Source: Ambulatory Visit | Attending: Radiation Oncology | Admitting: Radiation Oncology

## 2021-07-15 ENCOUNTER — Other Ambulatory Visit (HOSPITAL_BASED_OUTPATIENT_CLINIC_OR_DEPARTMENT_OTHER): Payer: Self-pay

## 2021-07-15 DIAGNOSIS — Z510 Encounter for antineoplastic radiation therapy: Secondary | ICD-10-CM | POA: Diagnosis not present

## 2021-07-16 ENCOUNTER — Ambulatory Visit (INDEPENDENT_AMBULATORY_CARE_PROVIDER_SITE_OTHER): Payer: No Typology Code available for payment source

## 2021-07-16 ENCOUNTER — Other Ambulatory Visit: Payer: Self-pay

## 2021-07-16 ENCOUNTER — Ambulatory Visit
Admission: RE | Admit: 2021-07-16 | Discharge: 2021-07-16 | Disposition: A | Discharge status: Home / Self Care | Payer: No Typology Code available for payment source | Source: Ambulatory Visit | Attending: Radiation Oncology | Admitting: Radiation Oncology

## 2021-07-16 DIAGNOSIS — R9389 Abnormal findings on diagnostic imaging of other specified body structures: Secondary | ICD-10-CM | POA: Diagnosis not present

## 2021-07-16 DIAGNOSIS — R0609 Other forms of dyspnea: Secondary | ICD-10-CM | POA: Diagnosis not present

## 2021-07-16 DIAGNOSIS — Z510 Encounter for antineoplastic radiation therapy: Secondary | ICD-10-CM | POA: Diagnosis not present

## 2021-07-19 ENCOUNTER — Ambulatory Visit
Admission: RE | Admit: 2021-07-19 | Discharge: 2021-07-19 | Disposition: A | Discharge status: Home / Self Care | Payer: No Typology Code available for payment source | Source: Ambulatory Visit | Attending: Radiation Oncology | Admitting: Radiation Oncology

## 2021-07-19 ENCOUNTER — Other Ambulatory Visit: Payer: Self-pay

## 2021-07-19 DIAGNOSIS — Z510 Encounter for antineoplastic radiation therapy: Secondary | ICD-10-CM | POA: Diagnosis not present

## 2021-07-20 ENCOUNTER — Ambulatory Visit
Admission: RE | Admit: 2021-07-20 | Discharge: 2021-07-20 | Disposition: A | Discharge status: Home / Self Care | Payer: No Typology Code available for payment source | Source: Ambulatory Visit | Attending: Radiation Oncology | Admitting: Radiation Oncology

## 2021-07-20 DIAGNOSIS — Z510 Encounter for antineoplastic radiation therapy: Secondary | ICD-10-CM | POA: Diagnosis not present

## 2021-07-21 ENCOUNTER — Other Ambulatory Visit: Payer: Self-pay

## 2021-07-21 ENCOUNTER — Ambulatory Visit
Admission: RE | Admit: 2021-07-21 | Discharge: 2021-07-21 | Disposition: A | Discharge status: Home / Self Care | Payer: No Typology Code available for payment source | Source: Ambulatory Visit | Attending: Radiation Oncology | Admitting: Radiation Oncology

## 2021-07-21 DIAGNOSIS — Z510 Encounter for antineoplastic radiation therapy: Secondary | ICD-10-CM | POA: Diagnosis not present

## 2021-07-22 ENCOUNTER — Ambulatory Visit
Admission: RE | Admit: 2021-07-22 | Discharge: 2021-07-22 | Disposition: A | Discharge status: Home / Self Care | Payer: No Typology Code available for payment source | Source: Ambulatory Visit | Attending: Radiation Oncology | Admitting: Radiation Oncology

## 2021-07-22 DIAGNOSIS — Z510 Encounter for antineoplastic radiation therapy: Secondary | ICD-10-CM | POA: Diagnosis not present

## 2021-07-23 ENCOUNTER — Ambulatory Visit
Admission: RE | Admit: 2021-07-23 | Discharge: 2021-07-23 | Disposition: A | Discharge status: Home / Self Care | Payer: No Typology Code available for payment source | Source: Ambulatory Visit | Attending: Radiation Oncology | Admitting: Radiation Oncology

## 2021-07-23 ENCOUNTER — Other Ambulatory Visit: Payer: Self-pay

## 2021-07-23 ENCOUNTER — Ambulatory Visit: Payer: No Typology Code available for payment source | Admitting: Radiation Oncology

## 2021-07-23 DIAGNOSIS — Z510 Encounter for antineoplastic radiation therapy: Secondary | ICD-10-CM | POA: Diagnosis not present

## 2021-07-26 ENCOUNTER — Ambulatory Visit
Admission: RE | Admit: 2021-07-26 | Discharge: 2021-07-26 | Disposition: A | Discharge status: Home / Self Care | Payer: No Typology Code available for payment source | Source: Ambulatory Visit | Attending: Radiation Oncology | Admitting: Radiation Oncology

## 2021-07-26 ENCOUNTER — Other Ambulatory Visit: Payer: Self-pay

## 2021-07-26 DIAGNOSIS — Z510 Encounter for antineoplastic radiation therapy: Secondary | ICD-10-CM | POA: Diagnosis not present

## 2021-07-27 ENCOUNTER — Ambulatory Visit
Admission: RE | Admit: 2021-07-27 | Discharge: 2021-07-27 | Disposition: A | Discharge status: Home / Self Care | Payer: No Typology Code available for payment source | Source: Ambulatory Visit | Attending: Radiation Oncology | Admitting: Radiation Oncology

## 2021-07-27 DIAGNOSIS — C419 Malignant neoplasm of bone and articular cartilage, unspecified: Secondary | ICD-10-CM | POA: Insufficient documentation

## 2021-07-27 DIAGNOSIS — Z510 Encounter for antineoplastic radiation therapy: Secondary | ICD-10-CM | POA: Diagnosis present

## 2021-07-28 ENCOUNTER — Ambulatory Visit
Admission: RE | Admit: 2021-07-28 | Discharge: 2021-07-28 | Disposition: A | Discharge status: Home / Self Care | Payer: No Typology Code available for payment source | Source: Ambulatory Visit | Attending: Radiation Oncology | Admitting: Radiation Oncology

## 2021-07-28 ENCOUNTER — Other Ambulatory Visit: Payer: Self-pay

## 2021-07-28 ENCOUNTER — Other Ambulatory Visit: Payer: Self-pay | Admitting: Sports Medicine

## 2021-07-28 ENCOUNTER — Other Ambulatory Visit (HOSPITAL_BASED_OUTPATIENT_CLINIC_OR_DEPARTMENT_OTHER): Payer: Self-pay

## 2021-07-28 DIAGNOSIS — Z510 Encounter for antineoplastic radiation therapy: Secondary | ICD-10-CM | POA: Diagnosis not present

## 2021-07-28 MED ORDER — ONDANSETRON 8 MG PO TBDP
8.0000 mg | ORAL_TABLET | Freq: Three times a day (TID) | ORAL | 3 refills | Status: DC | PRN
  Filled 2021-07-28: qty 20, 7d supply, fill #0
  Filled 2021-12-21: qty 20, 7d supply, fill #1
  Filled 2022-01-14: qty 20, 7d supply, fill #2
  Filled 2022-03-22: qty 20, 7d supply, fill #3

## 2021-07-29 ENCOUNTER — Ambulatory Visit
Admission: RE | Admit: 2021-07-29 | Discharge: 2021-07-29 | Disposition: A | Discharge status: Home / Self Care | Payer: No Typology Code available for payment source | Source: Ambulatory Visit | Attending: Radiation Oncology | Admitting: Radiation Oncology

## 2021-07-29 DIAGNOSIS — Z510 Encounter for antineoplastic radiation therapy: Secondary | ICD-10-CM | POA: Diagnosis not present

## 2021-07-30 ENCOUNTER — Ambulatory Visit: Payer: No Typology Code available for payment source | Admitting: Radiation Oncology

## 2021-07-30 ENCOUNTER — Other Ambulatory Visit: Payer: Self-pay

## 2021-07-30 ENCOUNTER — Ambulatory Visit
Admission: RE | Admit: 2021-07-30 | Discharge: 2021-07-30 | Disposition: A | Discharge status: Home / Self Care | Payer: No Typology Code available for payment source | Source: Ambulatory Visit | Attending: Radiation Oncology | Admitting: Radiation Oncology

## 2021-07-30 DIAGNOSIS — Z510 Encounter for antineoplastic radiation therapy: Secondary | ICD-10-CM | POA: Diagnosis not present

## 2021-08-02 ENCOUNTER — Ambulatory Visit
Admission: RE | Admit: 2021-08-02 | Discharge: 2021-08-02 | Disposition: A | Discharge status: Home / Self Care | Payer: No Typology Code available for payment source | Source: Ambulatory Visit | Attending: Radiation Oncology | Admitting: Radiation Oncology

## 2021-08-02 ENCOUNTER — Other Ambulatory Visit: Payer: Self-pay

## 2021-08-02 DIAGNOSIS — Z510 Encounter for antineoplastic radiation therapy: Secondary | ICD-10-CM | POA: Diagnosis not present

## 2021-08-03 ENCOUNTER — Ambulatory Visit: Payer: No Typology Code available for payment source | Admitting: Radiation Oncology

## 2021-08-03 DIAGNOSIS — Z510 Encounter for antineoplastic radiation therapy: Secondary | ICD-10-CM | POA: Diagnosis not present

## 2021-08-04 ENCOUNTER — Ambulatory Visit
Admission: RE | Admit: 2021-08-04 | Discharge: 2021-08-04 | Disposition: A | Discharge status: Home / Self Care | Payer: No Typology Code available for payment source | Source: Ambulatory Visit | Attending: Radiation Oncology | Admitting: Radiation Oncology

## 2021-08-04 ENCOUNTER — Other Ambulatory Visit: Payer: Self-pay

## 2021-08-04 DIAGNOSIS — Z510 Encounter for antineoplastic radiation therapy: Secondary | ICD-10-CM | POA: Diagnosis not present

## 2021-08-05 ENCOUNTER — Ambulatory Visit: Payer: No Typology Code available for payment source

## 2021-08-05 DIAGNOSIS — Z510 Encounter for antineoplastic radiation therapy: Secondary | ICD-10-CM | POA: Diagnosis not present

## 2021-08-06 ENCOUNTER — Ambulatory Visit: Payer: No Typology Code available for payment source

## 2021-08-06 ENCOUNTER — Other Ambulatory Visit: Payer: Self-pay

## 2021-08-06 DIAGNOSIS — Z510 Encounter for antineoplastic radiation therapy: Secondary | ICD-10-CM | POA: Diagnosis not present

## 2021-08-09 ENCOUNTER — Ambulatory Visit
Admission: RE | Admit: 2021-08-09 | Discharge: 2021-08-09 | Disposition: A | Discharge status: Home / Self Care | Payer: No Typology Code available for payment source | Source: Ambulatory Visit | Attending: Radiation Oncology | Admitting: Radiation Oncology

## 2021-08-09 ENCOUNTER — Ambulatory Visit: Payer: No Typology Code available for payment source

## 2021-08-09 ENCOUNTER — Other Ambulatory Visit: Payer: Self-pay

## 2021-08-09 DIAGNOSIS — Z510 Encounter for antineoplastic radiation therapy: Secondary | ICD-10-CM | POA: Diagnosis not present

## 2021-08-10 ENCOUNTER — Ambulatory Visit
Admission: RE | Admit: 2021-08-10 | Discharge: 2021-08-10 | Disposition: A | Discharge status: Home / Self Care | Payer: No Typology Code available for payment source | Source: Ambulatory Visit | Attending: Radiation Oncology | Admitting: Radiation Oncology

## 2021-08-10 DIAGNOSIS — Z510 Encounter for antineoplastic radiation therapy: Secondary | ICD-10-CM | POA: Diagnosis not present

## 2021-08-11 ENCOUNTER — Ambulatory Visit
Admission: RE | Admit: 2021-08-11 | Discharge: 2021-08-11 | Disposition: A | Discharge status: Home / Self Care | Payer: No Typology Code available for payment source | Source: Ambulatory Visit | Attending: Radiation Oncology | Admitting: Radiation Oncology

## 2021-08-11 ENCOUNTER — Other Ambulatory Visit: Payer: Self-pay

## 2021-08-11 DIAGNOSIS — Z510 Encounter for antineoplastic radiation therapy: Secondary | ICD-10-CM | POA: Diagnosis not present

## 2021-08-12 ENCOUNTER — Ambulatory Visit
Admission: RE | Admit: 2021-08-12 | Discharge: 2021-08-12 | Disposition: A | Discharge status: Home / Self Care | Payer: No Typology Code available for payment source | Source: Ambulatory Visit | Attending: Radiation Oncology | Admitting: Radiation Oncology

## 2021-08-12 ENCOUNTER — Encounter: Payer: Self-pay | Admitting: Radiation Oncology

## 2021-08-12 DIAGNOSIS — Z510 Encounter for antineoplastic radiation therapy: Secondary | ICD-10-CM | POA: Diagnosis not present

## 2021-08-13 ENCOUNTER — Other Ambulatory Visit (HOSPITAL_BASED_OUTPATIENT_CLINIC_OR_DEPARTMENT_OTHER): Payer: Self-pay

## 2021-08-13 ENCOUNTER — Ambulatory Visit: Payer: No Typology Code available for payment source | Admitting: Radiation Oncology

## 2021-08-16 NOTE — Progress Notes (Signed)
                                                                                                                                                             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  Intent: Curative  Radiation Treatment Dates: 06/24/2021 through 08/12/2021 Site Technique Total Dose (Gy) Dose per Fx (Gy) Completed Fx Beam Energies  Ribs, Right: Chest_Rt 3D 60/60 2 30/30 6X, 10X  Ribs, Right: Chest_Rt_Bst 3D 10/10 2 5/5 6X, 10X   Narrative: The patient tolerated radiation therapy relatively well.  He developed some mild nausea, and anticipated skin changes in the treatment field.   Plan: The patient will receive a call in about one month from the radiation oncology department. He will continue follow up with Dr. Kipp Brood as well.   ________________________________________________    Carola Rhine, [facility]

## 2021-09-06 ENCOUNTER — Other Ambulatory Visit (HOSPITAL_BASED_OUTPATIENT_CLINIC_OR_DEPARTMENT_OTHER): Payer: Self-pay

## 2021-09-06 MED ORDER — CARESTART COVID-19 HOME TEST VI KIT
PACK | 0 refills | Status: DC
Start: 2021-09-06 — End: 2022-04-25
  Filled 2021-09-06: qty 2, 4d supply, fill #0

## 2021-09-10 ENCOUNTER — Other Ambulatory Visit (HOSPITAL_BASED_OUTPATIENT_CLINIC_OR_DEPARTMENT_OTHER): Payer: Self-pay

## 2021-09-10 ENCOUNTER — Other Ambulatory Visit: Payer: Self-pay | Admitting: Sports Medicine

## 2021-09-10 DIAGNOSIS — F909 Attention-deficit hyperactivity disorder, unspecified type: Secondary | ICD-10-CM

## 2021-09-10 MED ORDER — METHYLPHENIDATE HCL ER (OSM) 18 MG PO TBCR
18.0000 mg | EXTENDED_RELEASE_TABLET | Freq: Every day | ORAL | 0 refills | Status: DC
Start: 2021-09-10 — End: 2021-10-10
  Filled 2021-09-10: qty 30, 30d supply, fill #0

## 2021-09-13 ENCOUNTER — Ambulatory Visit
Admission: RE | Admit: 2021-09-13 | Discharge: 2021-09-13 | Disposition: A | Discharge status: Home / Self Care | Payer: No Typology Code available for payment source | Source: Ambulatory Visit | Attending: Radiation Oncology | Admitting: Radiation Oncology

## 2021-09-13 DIAGNOSIS — R222 Localized swelling, mass and lump, trunk: Secondary | ICD-10-CM

## 2021-09-13 NOTE — Progress Notes (Signed)
°  Radiation Oncology         [phone]) [phone] ________________________________  Name: [name] [name] MRN: [id number]  Date of Service: 09/13/2021  DOB: [birth year]  Post Treatment Telephone Note  Diagnosis:   Grade 1 chondrosarcoma of the chest wall with positive lateral margin.  Interval Since Last Radiation:  4 weeks   06/24/2021 through 08/12/2021 Site Technique Total Dose (Gy) Dose per Fx (Gy) Completed Fx Beam Energies  Ribs, Right: Chest_Rt 3D 60/60 2 30/30 6X, 10X  Ribs, Right: Chest_Rt_Bst 3D 10/10 2 5/5 6X, 10X    Narrative:  The patient was contacted today for routine follow-up. During treatment he did very well with radiotherapy and did not have significant desquamation. He reports he is doing well and hasn't had any recent episodes of nausea. His scar has started to fade, as well as the radiation dermatitis. His fatigue has also improved, and he's looking forward to spending time with family this week and next for the holidays.  Impression/Plan: 1. 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 The patient has been doing well since completion of radiotherapy. We discussed that we would be happy to continue to follow him as needed, but he will also continue to follow up with Dr. Kipp Brood as well.       Carola Rhine, PAC

## 2021-10-05 ENCOUNTER — Other Ambulatory Visit (HOSPITAL_BASED_OUTPATIENT_CLINIC_OR_DEPARTMENT_OTHER): Payer: Self-pay

## 2021-10-08 ENCOUNTER — Other Ambulatory Visit (HOSPITAL_BASED_OUTPATIENT_CLINIC_OR_DEPARTMENT_OTHER): Payer: Self-pay

## 2021-10-10 ENCOUNTER — Other Ambulatory Visit: Payer: Self-pay | Admitting: Sports Medicine

## 2021-10-10 DIAGNOSIS — F909 Attention-deficit hyperactivity disorder, unspecified type: Secondary | ICD-10-CM

## 2021-10-11 ENCOUNTER — Other Ambulatory Visit (HOSPITAL_BASED_OUTPATIENT_CLINIC_OR_DEPARTMENT_OTHER): Payer: Self-pay

## 2021-10-11 MED ORDER — METHYLPHENIDATE HCL ER (OSM) 18 MG PO TBCR
18.0000 mg | EXTENDED_RELEASE_TABLET | Freq: Every day | ORAL | 0 refills | Status: DC
  Filled 2021-10-11: qty 30, 30d supply, fill #0

## 2021-10-19 ENCOUNTER — Other Ambulatory Visit (HOSPITAL_BASED_OUTPATIENT_CLINIC_OR_DEPARTMENT_OTHER): Payer: Self-pay

## 2021-10-19 ENCOUNTER — Ambulatory Visit (INDEPENDENT_AMBULATORY_CARE_PROVIDER_SITE_OTHER): Payer: 59 | Admitting: Sports Medicine

## 2021-10-19 ENCOUNTER — Other Ambulatory Visit: Payer: Self-pay

## 2021-10-19 DIAGNOSIS — F909 Attention-deficit hyperactivity disorder, unspecified type: Secondary | ICD-10-CM

## 2021-10-19 DIAGNOSIS — E669 Obesity, unspecified: Secondary | ICD-10-CM

## 2021-10-19 MED ORDER — WEGOVY 0.25 MG/0.5ML ~~LOC~~ SOAJ
0.2500 mg | SUBCUTANEOUS | 0 refills | Status: DC
Start: 2021-10-19 — End: 2021-11-18
  Filled 2021-10-19 – 2021-10-22 (×2): qty 2, 28d supply, fill #0

## 2021-10-19 NOTE — Assessment & Plan Note (Signed)
Well-controlled on current medications, no change in plan.

## 2021-10-19 NOTE — Assessment & Plan Note (Signed)
This is a pleasant 36 year old male, he does have a BMI in the obese range, he lost some good weight initially with weight loss treatments including phentermine, but has unfortunately gained it back during his radiation therapy. We are going to switch to GLP-1's, he will be working in a multidisciplinary approach with exercise prescriptions, calorie counting and dietary restrictions, we will only use a single GLP-1. We will bring him back for regular weight checks. Starting ODQVHQ. If this is not approved we will try Mounjaro.

## 2021-10-19 NOTE — Progress Notes (Signed)
° ° °  Procedures performed today:    None.  Independent interpretation of notes and tests performed by another provider:   None.  Brief History, Exam, Impression, and Recommendations:    Adult ADHD Well-controlled on current medications, no change in plan.  Obesity (BMI 30-39.9) This is a pleasant 36 year old male, he does have a BMI in the obese range, he lost some good weight initially with weight loss treatments including phentermine, but has unfortunately gained it back during his radiation therapy. We are going to switch to GLP-1's, he will be working in a multidisciplinary approach with exercise prescriptions, calorie counting and dietary restrictions, we will only use a single GLP-1. We will bring him back for regular weight checks. Starting IZTIWP. If this is not approved we will try Mounjaro.    ___________________________________________ Gwen Her. Dianah Field, M.D., ABFM., CAQSM. Primary Care and [place] Instructor of [place] of [facility] of [employer]

## 2021-10-20 ENCOUNTER — Telehealth: Payer: Self-pay

## 2021-10-20 NOTE — Telephone Encounter (Signed)
Medication: WEGOVY 0.25 MG/0.5ML SOAJ Prior authorization submitted via CoverMyMeds on 10/20/2021 [place] submission pending

## 2021-10-21 ENCOUNTER — Other Ambulatory Visit (HOSPITAL_BASED_OUTPATIENT_CLINIC_OR_DEPARTMENT_OTHER): Payer: Self-pay

## 2021-10-22 ENCOUNTER — Other Ambulatory Visit (HOSPITAL_BASED_OUTPATIENT_CLINIC_OR_DEPARTMENT_OTHER): Payer: Self-pay

## 2021-11-04 ENCOUNTER — Other Ambulatory Visit: Payer: Self-pay | Admitting: Thoracic Surgery (Cardiothoracic Vascular Surgery)

## 2021-11-04 DIAGNOSIS — R222 Localized swelling, mass and lump, trunk: Secondary | ICD-10-CM

## 2021-11-10 ENCOUNTER — Other Ambulatory Visit: Payer: Self-pay | Admitting: Sports Medicine

## 2021-11-10 ENCOUNTER — Other Ambulatory Visit (HOSPITAL_BASED_OUTPATIENT_CLINIC_OR_DEPARTMENT_OTHER): Payer: Self-pay

## 2021-11-10 DIAGNOSIS — F909 Attention-deficit hyperactivity disorder, unspecified type: Secondary | ICD-10-CM

## 2021-11-10 MED ORDER — METHYLPHENIDATE HCL ER (OSM) 18 MG PO TBCR
18.0000 mg | EXTENDED_RELEASE_TABLET | Freq: Every day | ORAL | 0 refills | Status: DC
  Filled 2021-11-10: qty 30, 30d supply, fill #0

## 2021-11-10 NOTE — Telephone Encounter (Signed)
Left msg that refill was sent to pharmacy.

## 2021-11-16 ENCOUNTER — Other Ambulatory Visit (HOSPITAL_BASED_OUTPATIENT_CLINIC_OR_DEPARTMENT_OTHER): Payer: Self-pay

## 2021-11-18 ENCOUNTER — Ambulatory Visit: Payer: 59 | Admitting: Sports Medicine

## 2021-11-18 ENCOUNTER — Other Ambulatory Visit: Payer: Self-pay

## 2021-11-18 ENCOUNTER — Other Ambulatory Visit (HOSPITAL_BASED_OUTPATIENT_CLINIC_OR_DEPARTMENT_OTHER): Payer: Self-pay

## 2021-11-18 DIAGNOSIS — E669 Obesity, unspecified: Secondary | ICD-10-CM | POA: Diagnosis not present

## 2021-11-18 MED ORDER — WEGOVY 1.7 MG/0.75ML ~~LOC~~ SOAJ
1.7000 mg | SUBCUTANEOUS | 0 refills | Status: DC
  Filled 2021-11-18: qty 3, fill #0
  Filled 2022-01-09: qty 3, 28d supply, fill #0

## 2021-11-18 MED ORDER — WEGOVY 2.4 MG/0.75ML ~~LOC~~ SOAJ
2.4000 mg | SUBCUTANEOUS | 11 refills | Status: DC
  Filled 2021-11-18: qty 3, fill #0
  Filled 2022-02-08: qty 3, 28d supply, fill #0
  Filled 2022-03-07: qty 3, 28d supply, fill #1
  Filled 2022-04-08: qty 3, 28d supply, fill #2

## 2021-11-18 MED ORDER — WEGOVY 1 MG/0.5ML ~~LOC~~ SOAJ
1.0000 mg | SUBCUTANEOUS | 0 refills | Status: DC
Start: 2021-11-18 — End: 2022-01-17
  Filled 2021-11-18: qty 2, fill #0
  Filled 2021-12-13: qty 2, 28d supply, fill #0

## 2021-11-18 MED ORDER — WEGOVY 0.5 MG/0.5ML ~~LOC~~ SOAJ
0.5000 mg | SUBCUTANEOUS | 0 refills | Status: DC
Start: 2021-11-18 — End: 2022-01-17
  Filled 2021-11-18: qty 2, 28d supply, fill #0

## 2021-11-18 NOTE — Progress Notes (Signed)
° ° °  Procedures performed today:    None.  Independent interpretation of notes and tests performed by another provider:   None.  Brief History, Exam, Impression, and Recommendations:    Obesity (BMI 30-39.17) [name] [name] returns, he is a pleasant 36 year old male, BMI was in the obese range. We have tried multiple modalities including phentermine but continues to gain back his weight, particularly during his radiation therapy. We started [facility], we were able to get it approved and he has done the first month and lost 6 pounds. He has a touch of nausea the day after the first dose but otherwise doing well, I will go ahead and call in the rest of the doses of Wegovy and see him back in 2 months. Goal weight 180 pounds, I also challenged him to get in 30 minutes of aerobic exercise twice a week, he does have access to the [employer].    ___________________________________________ Gwen Her. Dianah Field, M.D., ABFM., CAQSM. Primary Care and [place] Instructor of [place] of [facility] of Medicine

## 2021-11-18 NOTE — Assessment & Plan Note (Addendum)
[name] [name] returns, he is a pleasant 36 year old male, BMI was in the obese range. We have tried multiple modalities including phentermine but continues to gain back his weight, particularly during his radiation therapy. We started [facility], we were able to get it approved and he has done the first month and lost 6 pounds. He has a touch of nausea the day after the first dose but otherwise doing well, I will go ahead and call in the rest of the doses of Wegovy and see him back in 2 months. Goal weight 180 pounds, I also challenged him to get in 30 minutes of aerobic exercise twice a week, he does have access to the [employer].

## 2021-11-22 ENCOUNTER — Other Ambulatory Visit: Payer: Self-pay

## 2021-12-11 ENCOUNTER — Telehealth: Payer: 59 | Admitting: Nurse Practitioner

## 2021-12-11 DIAGNOSIS — J029 Acute pharyngitis, unspecified: Secondary | ICD-10-CM

## 2021-12-11 NOTE — Progress Notes (Signed)
?E-Visit for Sore Throat ? ?We are sorry that you are not feeling well.  Here is how we plan to help! ? ?Your symptoms indicate a likely viral infection (Pharyngitis).   Pharyngitis is inflammation in the back of the throat which can cause a sore throat, scratchiness and sometimes difficulty swallowing.   Pharyngitis is typically caused by a respiratory virus and will just run its course. COVID-19 can also start with a sore throat, you may want to consider taking a home COVID test.   ? ?It may also be caused by seasonal allergies that can cause post nasal drainage. Because your eyes are also red it is likely this is allergic. For your red eyes I would recommend over the counter allergy eye drops. Avoid "red eye relief" eye drops, instead look for antihistamine or allergy relief only. These can be found in most any pharmacy's ear/eye area.  ? ?Please keep in mind that your symptoms could last up to 10 days.  For throat pain, we recommend over the counter oral pain relief medications such as acetaminophen or aspirin, or anti-inflammatory medications such as ibuprofen or naproxen sodium.  Topical treatments such as oral throat lozenges or sprays may be used as needed.  Avoid close contact with loved ones, especially the very young and elderly.  Remember to wash your hands thoroughly throughout the day as this is the number one way to prevent the spread of infection and wipe down door knobs and counters with disinfectant. ? ?After careful review of your answers, I would not recommend an antibiotic for your condition.  Antibiotics should not be used to treat conditions that we suspect are caused by viruses like the virus that causes the common cold or flu. However, some people can have Strep with atypical symptoms. You may need formal testing in clinic or office to confirm if your symptoms continue or worsen, especially if you develop a fever.  ? ?Providers prescribe antibiotics to treat infections caused by bacteria.  Antibiotics are very powerful in treating bacterial infections when they are used properly.  To maintain their effectiveness, they should be used only when necessary.  Overuse of antibiotics has resulted in the development of super bugs that are resistant to treatment!   ? ?Home Care: ?Only take medications as instructed by your medical team. ?Do not drink alcohol while taking these medications. ?A steam or ultrasonic humidifier can help congestion.  You can place a towel over your head and breathe in the steam from hot water coming from a faucet. ?Avoid close contacts especially the very young and the elderly. ?Cover your mouth when you cough or sneeze. ?Always remember to wash your hands. ? ?Get Help Right Away If: ?You develop worsening fever or throat pain. ?You develop a severe head ache or visual changes. ?Your symptoms persist after you have completed your treatment plan. ? ?Make sure you ?Understand these instructions. ?Will watch your condition. ?Will get help right away if you are not doing well or get worse. ? ? ?Thank you for choosing an e-visit. ? ?Your e-visit answers were reviewed by a board certified advanced clinical practitioner to complete your personal care plan. Depending upon the condition, your plan could have included both over the counter or prescription medications. ? ?Please review your pharmacy choice. Make sure the pharmacy is open so you can pick up prescription now. If there is a problem, you may contact your provider through [employer] and have the prescription routed to another pharmacy.  Your  safety is important to [place]. If you have drug allergies check your prescription carefully.  ? ?For the next 24 hours you can use MyChart to ask questions about today's visit, request a non-urgent call back, or ask for a work or school excuse. ?You will get an email in the next two days asking about your experience. I hope that your e-visit has been valuable and will speed your recovery.  ?

## 2021-12-11 NOTE — Progress Notes (Signed)
I spent approximately 7 minutes reviewing the patient's history, current symptoms and coordinating their plan of care today.   ?

## 2021-12-13 ENCOUNTER — Other Ambulatory Visit (HOSPITAL_BASED_OUTPATIENT_CLINIC_OR_DEPARTMENT_OTHER): Payer: Self-pay

## 2021-12-13 ENCOUNTER — Other Ambulatory Visit: Payer: Self-pay | Admitting: Sports Medicine

## 2021-12-13 DIAGNOSIS — F909 Attention-deficit hyperactivity disorder, unspecified type: Secondary | ICD-10-CM

## 2021-12-13 MED ORDER — METHYLPHENIDATE HCL ER (OSM) 18 MG PO TBCR
18.0000 mg | EXTENDED_RELEASE_TABLET | Freq: Every day | ORAL | 0 refills | Status: DC
  Filled 2021-12-13: qty 90, 90d supply, fill #0

## 2021-12-21 ENCOUNTER — Other Ambulatory Visit (HOSPITAL_BASED_OUTPATIENT_CLINIC_OR_DEPARTMENT_OTHER): Payer: Self-pay

## 2021-12-23 ENCOUNTER — Ambulatory Visit (INDEPENDENT_AMBULATORY_CARE_PROVIDER_SITE_OTHER): Payer: 59

## 2021-12-23 DIAGNOSIS — R918 Other nonspecific abnormal finding of lung field: Secondary | ICD-10-CM | POA: Diagnosis not present

## 2021-12-23 DIAGNOSIS — R222 Localized swelling, mass and lump, trunk: Secondary | ICD-10-CM | POA: Diagnosis not present

## 2021-12-23 DIAGNOSIS — C419 Malignant neoplasm of bone and articular cartilage, unspecified: Secondary | ICD-10-CM | POA: Diagnosis not present

## 2021-12-23 DIAGNOSIS — J984 Other disorders of lung: Secondary | ICD-10-CM | POA: Diagnosis not present

## 2021-12-24 ENCOUNTER — Ambulatory Visit (INDEPENDENT_AMBULATORY_CARE_PROVIDER_SITE_OTHER): Payer: 59 | Admitting: Thoracic Surgery (Cardiothoracic Vascular Surgery)

## 2021-12-24 ENCOUNTER — Other Ambulatory Visit: Payer: 59

## 2021-12-24 VITALS — BP 144/85 | HR 79 | Resp 20 | Ht 69.0 in | Wt 224.0 lb

## 2021-12-24 DIAGNOSIS — R222 Localized swelling, mass and lump, trunk: Secondary | ICD-10-CM | POA: Diagnosis not present

## 2021-12-24 NOTE — Progress Notes (Signed)
? ?   ?  [address] ?      [place] [address] ?            [id number]       ? ?[name] [name] ?[name] [name] Record #[id number] ?Date of Birth: 1986-01-16 ? ?Referring: [name] [name],* ?Primary Care: [name] Decamp, MD ?Primary Cardiologist:None ? ?Reason for visit:   follow-up ? ?History of Present Illness:     ?Mr. [name] [name] comes in for 64-monthappointment.  Overall he is doing well.  He has no complaints. ? ?Physical Exam: ?BP (!) 144/85   Pulse 79   Resp 20   Ht '5\' 9"'$  (1.753 m)   Wt 224 lb (101.6 kg)   SpO2 98%   BMI 33.08 kg/m?  ? ?Alert NAD ?Abdomen, ND ? ? ? ?Diagnostic Studies & Laboratory data: ?CT chest: ?IMPRESSION: ?1. Slight increase in soft tissue along the right chest wall ?resection site and adjacent airspace disease. This likely reflects ?post treatment change in this patient known with ongoing ?radiotherapy. ?2. No evidence for residual or recurrent tumor. ?  ? 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  He was subsequently treated with adjuvant radiation.  Overall he is doing well.  His CT chest does show evidence of radiation changes to his chest wall and lung, but there is no residual recurrent tumor.  I will see him back virtually in 1 year with another CT chest. ? ? ?HLucile CraterLightfoot ?12/24/2021 3:10 PM ? ? ? ? ? ? ?

## 2022-01-09 ENCOUNTER — Other Ambulatory Visit: Payer: Self-pay | Admitting: Sports Medicine

## 2022-01-09 DIAGNOSIS — F4323 Adjustment disorder with mixed anxiety and depressed mood: Secondary | ICD-10-CM

## 2022-01-09 DIAGNOSIS — I7300 Raynaud's syndrome without gangrene: Secondary | ICD-10-CM

## 2022-01-10 ENCOUNTER — Other Ambulatory Visit (HOSPITAL_BASED_OUTPATIENT_CLINIC_OR_DEPARTMENT_OTHER): Payer: Self-pay

## 2022-01-10 MED ORDER — AMLODIPINE BESYLATE 2.5 MG PO TABS
ORAL_TABLET | Freq: Every day | ORAL | 2 refills | Status: DC
  Filled 2022-01-10: qty 90, 90d supply, fill #0
  Filled 2022-04-08: qty 90, 90d supply, fill #1
  Filled 2022-07-21: qty 90, 90d supply, fill #2

## 2022-01-10 MED ORDER — VORTIOXETINE HBR 10 MG PO TABS
10.0000 mg | ORAL_TABLET | Freq: Every day | ORAL | 11 refills | Status: DC
  Filled 2022-01-10: qty 30, 30d supply, fill #0
  Filled 2022-02-08: qty 30, 30d supply, fill #1
  Filled 2022-03-14: qty 30, 30d supply, fill #2
  Filled 2022-04-08: qty 30, 30d supply, fill #3
  Filled 2022-05-11: qty 30, 30d supply, fill #4
  Filled 2022-06-10: qty 30, 30d supply, fill #5
  Filled 2022-07-21: qty 30, 30d supply, fill #6
  Filled 2022-08-22: qty 30, 30d supply, fill #7
  Filled 2022-09-22: qty 30, 30d supply, fill #8
  Filled 2022-10-19: qty 30, 30d supply, fill #9
  Filled 2022-12-12: qty 30, 30d supply, fill #10

## 2022-01-14 ENCOUNTER — Other Ambulatory Visit (HOSPITAL_BASED_OUTPATIENT_CLINIC_OR_DEPARTMENT_OTHER): Payer: Self-pay

## 2022-01-17 ENCOUNTER — Ambulatory Visit: Payer: 59 | Admitting: Sports Medicine

## 2022-01-17 VITALS — BP 121/84 | HR 92 | Wt 217.0 lb

## 2022-01-17 DIAGNOSIS — R222 Localized swelling, mass and lump, trunk: Secondary | ICD-10-CM | POA: Diagnosis not present

## 2022-01-17 DIAGNOSIS — Z0000 Encounter for general adult medical examination without abnormal findings: Secondary | ICD-10-CM | POA: Diagnosis not present

## 2022-01-17 DIAGNOSIS — Z8042 Family history of malignant neoplasm of prostate: Secondary | ICD-10-CM | POA: Diagnosis not present

## 2022-01-17 DIAGNOSIS — E669 Obesity, unspecified: Secondary | ICD-10-CM | POA: Diagnosis not present

## 2022-01-17 DIAGNOSIS — E781 Pure hyperglyceridemia: Secondary | ICD-10-CM

## 2022-01-17 DIAGNOSIS — N139 Obstructive and reflux uropathy, unspecified: Secondary | ICD-10-CM | POA: Diagnosis not present

## 2022-01-17 NOTE — Progress Notes (Signed)
? ? ?  Procedures performed today:   ? ?None. ? ?Independent interpretation of notes and tests performed by another provider:  ? ?None. ? ?Brief History, Exam, Impression, and Recommendations:   ? 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 ?I did give him an exercise prescription and suggested starting with a brisk walk rather than trying to run. ?We will revisit this. ? ?Annual physical exam ?Due for an annual physical, adding routine labs including PSA. ? ?Obesity (BMI 30-39.9) ?Additional 12 pound weight loss with Wegovy, goal weight is in the 180s, he is getting close. ?Still needs to work a little harder on getting consistent with aerobic exercise in the gym 2-3 times a week. 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 ? ? ? ?___________________________________________ ?Gwen Her. Dianah Field, M.D., ABFM., CAQSM. ?Primary Care and Sports Medicine ?[place] ? ?Adjunct Instructor of Family Medicine  ?[employer] ?

## 2022-01-17 NOTE — Assessment & Plan Note (Signed)
Additional 12 pound weight loss with Wegovy, goal weight is in the 180s, he is getting close. ?Still needs to work a little harder on getting consistent with aerobic exercise in the gym 2-3 times a week. 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 ?

## 2022-01-17 NOTE — Assessment & Plan Note (Signed)
Due for an annual physical, adding routine labs including PSA. ?

## 2022-01-17 NOTE — Assessment & Plan Note (Signed)
[name] [name] is now post osteo/chondrosarcoma excision from his right rib cage, overall doing well, he does have some pulmonary scarring from the radiation and this is limited his exercise tolerance. ?I did give him an exercise prescription and suggested starting with a brisk walk rather than trying to run. ?We will revisit this. ?

## 2022-02-08 ENCOUNTER — Other Ambulatory Visit (HOSPITAL_BASED_OUTPATIENT_CLINIC_OR_DEPARTMENT_OTHER): Payer: Self-pay

## 2022-02-09 ENCOUNTER — Telehealth: Payer: Self-pay

## 2022-02-09 NOTE — Telephone Encounter (Addendum)
Initiated Prior authorization ENM:MHWKGS 2.'4MG'$ /0.75ML auto-injectors Via: Covermymeds Case/Key:[id number] Status: approved  as of 02/09/22 Reason:The authorization is effective for a maximum of 12 fills from 02/10/2022 to 02/10/2023, as long as the member is enrolled in their current health plan. The request was approved as submitted. This request has been approved for 50m per 28 days.A second prior authorization (5808) has been entered for [facility]25mg'$ /0.567m this request is effective from 02/10/2022 and is valid until 02/10/2023 for 12 fills and is limited to 59m65mer 28 days. A third prior authorization (5809) has been entered for [facility]'$ /0.5mL100mhis request is effective from 02/10/2022 and is valid until 02/10/2023 for 12 fills and is limited to 59mL 58m 28 days. A fourth prior authorization (5810) has been entered for [facility]'1mg'$ /0.5mL, 38ms request is effective from 02/10/2022 and is valid until 02/10/2023 for 12 fills and is limited to 59mL pe17m8 days. A fifth prior authorization (5811) [id number] entered for Wegovy [facility]'7mg'$ /0.75mL, t59mrequest is effective from 02/10/2022 and is valid until 02/10/2023 for 12 fills and is limited to 3mL per 66mdays. Notified Pt via: Mychart

## 2022-02-10 ENCOUNTER — Other Ambulatory Visit (HOSPITAL_BASED_OUTPATIENT_CLINIC_OR_DEPARTMENT_OTHER): Payer: Self-pay

## 2022-02-11 ENCOUNTER — Other Ambulatory Visit (HOSPITAL_BASED_OUTPATIENT_CLINIC_OR_DEPARTMENT_OTHER): Payer: Self-pay

## 2022-02-14 ENCOUNTER — Other Ambulatory Visit (HOSPITAL_BASED_OUTPATIENT_CLINIC_OR_DEPARTMENT_OTHER): Payer: Self-pay

## 2022-03-07 ENCOUNTER — Other Ambulatory Visit: Payer: Self-pay | Admitting: Sports Medicine

## 2022-03-07 ENCOUNTER — Other Ambulatory Visit (HOSPITAL_BASED_OUTPATIENT_CLINIC_OR_DEPARTMENT_OTHER): Payer: Self-pay

## 2022-03-07 DIAGNOSIS — F909 Attention-deficit hyperactivity disorder, unspecified type: Secondary | ICD-10-CM

## 2022-03-08 ENCOUNTER — Other Ambulatory Visit (HOSPITAL_BASED_OUTPATIENT_CLINIC_OR_DEPARTMENT_OTHER): Payer: Self-pay

## 2022-03-08 MED ORDER — METHYLPHENIDATE HCL ER (OSM) 18 MG PO TBCR
18.0000 mg | EXTENDED_RELEASE_TABLET | Freq: Every day | ORAL | 0 refills | Status: DC
  Filled 2022-03-08 – 2022-03-11 (×2): qty 90, 90d supply, fill #0

## 2022-03-09 ENCOUNTER — Other Ambulatory Visit (HOSPITAL_BASED_OUTPATIENT_CLINIC_OR_DEPARTMENT_OTHER): Payer: Self-pay

## 2022-03-10 ENCOUNTER — Other Ambulatory Visit (HOSPITAL_BASED_OUTPATIENT_CLINIC_OR_DEPARTMENT_OTHER): Payer: Self-pay

## 2022-03-11 ENCOUNTER — Other Ambulatory Visit (HOSPITAL_BASED_OUTPATIENT_CLINIC_OR_DEPARTMENT_OTHER): Payer: Self-pay

## 2022-03-14 ENCOUNTER — Other Ambulatory Visit (HOSPITAL_BASED_OUTPATIENT_CLINIC_OR_DEPARTMENT_OTHER): Payer: Self-pay

## 2022-03-22 ENCOUNTER — Other Ambulatory Visit (HOSPITAL_BASED_OUTPATIENT_CLINIC_OR_DEPARTMENT_OTHER): Payer: Self-pay

## 2022-04-08 ENCOUNTER — Other Ambulatory Visit (HOSPITAL_BASED_OUTPATIENT_CLINIC_OR_DEPARTMENT_OTHER): Payer: Self-pay

## 2022-04-08 MED ORDER — OMRON 3 SERIES BP MONITOR DEVI
0 refills | Status: DC
  Filled 2022-04-08: qty 1, 30d supply, fill #0

## 2022-04-24 ENCOUNTER — Observation Stay (HOSPITAL_BASED_OUTPATIENT_CLINIC_OR_DEPARTMENT_OTHER)
Admission: EM | Admit: 2022-04-24 | Discharge: 2022-04-25 | Disposition: A | Discharge status: Home / Self Care | Payer: 59 | Attending: Internal Medicine | Admitting: Internal Medicine

## 2022-04-24 ENCOUNTER — Emergency Department (HOSPITAL_BASED_OUTPATIENT_CLINIC_OR_DEPARTMENT_OTHER): Payer: 59

## 2022-04-24 ENCOUNTER — Inpatient Hospital Stay (HOSPITAL_COMMUNITY): Payer: 59

## 2022-04-24 ENCOUNTER — Other Ambulatory Visit: Payer: Self-pay

## 2022-04-24 ENCOUNTER — Encounter (HOSPITAL_BASED_OUTPATIENT_CLINIC_OR_DEPARTMENT_OTHER): Payer: Self-pay

## 2022-04-24 DIAGNOSIS — E663 Overweight: Secondary | ICD-10-CM | POA: Diagnosis present

## 2022-04-24 DIAGNOSIS — Z9889 Other specified postprocedural states: Secondary | ICD-10-CM | POA: Diagnosis not present

## 2022-04-24 DIAGNOSIS — R1011 Right upper quadrant pain: Secondary | ICD-10-CM | POA: Diagnosis not present

## 2022-04-24 DIAGNOSIS — I7300 Raynaud's syndrome without gangrene: Secondary | ICD-10-CM | POA: Diagnosis present

## 2022-04-24 DIAGNOSIS — R7989 Other specified abnormal findings of blood chemistry: Secondary | ICD-10-CM | POA: Insufficient documentation

## 2022-04-24 DIAGNOSIS — Z6829 Body mass index (BMI) 29.0-29.9, adult: Secondary | ICD-10-CM | POA: Insufficient documentation

## 2022-04-24 DIAGNOSIS — F909 Attention-deficit hyperactivity disorder, unspecified type: Secondary | ICD-10-CM | POA: Diagnosis not present

## 2022-04-24 DIAGNOSIS — Z79899 Other long term (current) drug therapy: Secondary | ICD-10-CM | POA: Diagnosis not present

## 2022-04-24 DIAGNOSIS — R945 Abnormal results of liver function studies: Secondary | ICD-10-CM | POA: Diagnosis not present

## 2022-04-24 DIAGNOSIS — R932 Abnormal findings on diagnostic imaging of liver and biliary tract: Secondary | ICD-10-CM | POA: Diagnosis not present

## 2022-04-24 DIAGNOSIS — K8309 Other cholangitis: Secondary | ICD-10-CM | POA: Diagnosis not present

## 2022-04-24 DIAGNOSIS — R109 Unspecified abdominal pain: Secondary | ICD-10-CM | POA: Diagnosis not present

## 2022-04-24 DIAGNOSIS — R17 Unspecified jaundice: Secondary | ICD-10-CM | POA: Diagnosis not present

## 2022-04-24 DIAGNOSIS — R1013 Epigastric pain: Secondary | ICD-10-CM | POA: Diagnosis not present

## 2022-04-24 DIAGNOSIS — R0789 Other chest pain: Secondary | ICD-10-CM | POA: Diagnosis present

## 2022-04-24 HISTORY — DX: Personal history of malignant neoplasm of bone: Z85.830

## 2022-04-24 LAB — COMPREHENSIVE METABOLIC PANEL
ALT: 151 U/L — ABNORMAL HIGH (ref 0–44)
ALT: 292 U/L — ABNORMAL HIGH (ref 0–44)
AST: 203 U/L — ABNORMAL HIGH (ref 15–41)
AST: 347 U/L — ABNORMAL HIGH (ref 15–41)
Albumin: 4.8 g/dL (ref 3.5–5.0)
Albumin: 4.5 g/dL (ref 3.5–5.0)
Alkaline Phosphatase: 102 U/L (ref 38–126)
Alkaline Phosphatase: 107 U/L (ref 38–126)
Anion gap: 10 (ref 5–15)
Anion gap: 10 (ref 5–15)
BUN: 13 mg/dL (ref 6–20)
BUN: 12 mg/dL (ref 6–20)
CO2: 26 mmol/L (ref 22–32)
CO2: 27 mmol/L (ref 22–32)
Calcium: 9.1 mg/dL (ref 8.9–10.3)
Calcium: 9.6 mg/dL (ref 8.9–10.3)
Chloride: 104 mmol/L (ref 98–111)
Chloride: 104 mmol/L (ref 98–111)
Creatinine, Ser: 1.17 mg/dL (ref 0.61–1.24)
Creatinine, Ser: 1.07 mg/dL (ref 0.61–1.24)
GFR, Estimated: >60 mL/min (ref >60)
GFR, Estimated: >60 mL/min (ref >60)
Glucose, Bld: 119 mg/dL — ABNORMAL HIGH (ref 70–99)
Glucose, Bld: 95 mg/dL (ref 70–99)
Potassium: 3.8 mmol/L (ref 3.5–5.1)
Potassium: 4.1 mmol/L (ref 3.5–5.1)
Sodium: 140 mmol/L (ref 135–145)
Sodium: 141 mmol/L (ref 135–145)
Total Bilirubin: 2.4 mg/dL — ABNORMAL HIGH (ref 0.3–1.2)
Total Bilirubin: 2.9 mg/dL — ABNORMAL HIGH (ref 0.3–1.2)
Total Protein: 6.8 g/dL (ref 6.5–8.1)
Total Protein: 6.4 g/dL — ABNORMAL LOW (ref 6.5–8.1)

## 2022-04-24 LAB — CBC WITH DIFFERENTIAL/PLATELET
Abs Immature Granulocytes: 0.05 10*3/uL (ref 0.00–0.07)
Abs Immature Granulocytes: 0.02 10*3/uL (ref 0.00–0.07)
Basophils Absolute: 0 10*3/uL (ref 0.0–0.1)
Basophils Absolute: 0 10*3/uL (ref 0.0–0.1)
Basophils Relative: 0 %
Basophils Relative: 0 %
Eosinophils Absolute: 0 10*3/uL (ref 0.0–0.5)
Eosinophils Absolute: 0 10*3/uL (ref 0.0–0.5)
Eosinophils Relative: 0 %
Eosinophils Relative: 0 %
HCT: 47.7 % (ref 39.0–52.0)
HCT: 45.2 % (ref 39.0–52.0)
Hemoglobin: 16.7 g/dL (ref 13.0–17.0)
Hemoglobin: 15.5 g/dL (ref 13.0–17.0)
Immature Granulocytes: 0 %
Immature Granulocytes: 0 %
Lymphocytes Relative: 6 %
Lymphocytes Relative: 23 %
Lymphs Abs: 0.9 10*3/uL (ref 0.7–4.0)
Lymphs Abs: 1.7 10*3/uL (ref 0.7–4.0)
MCH: 30.8 pg (ref 26.0–34.0)
MCH: 30.3 pg (ref 26.0–34.0)
MCHC: 35 g/dL (ref 30.0–36.0)
MCHC: 34.3 g/dL (ref 30.0–36.0)
MCV: 88 fL (ref 80.0–100.0)
MCV: 88.3 fL (ref 80.0–100.0)
Monocytes Absolute: 0.9 10*3/uL (ref 0.1–1.0)
Monocytes Absolute: 0.8 10*3/uL (ref 0.1–1.0)
Monocytes Relative: 6 %
Monocytes Relative: 11 %
Neutro Abs: 13.1 10*3/uL — ABNORMAL HIGH (ref 1.7–7.7)
Neutro Abs: 4.7 10*3/uL (ref 1.7–7.7)
Neutrophils Relative %: 88 %
Neutrophils Relative %: 66 %
Platelets: 276 10*3/uL (ref 150–400)
Platelets: 270 10*3/uL (ref 150–400)
RBC: 5.42 MIL/uL (ref 4.22–5.81)
RBC: 5.12 MIL/uL (ref 4.22–5.81)
RDW: 12.4 % (ref 11.5–15.5)
RDW: 12.5 % (ref 11.5–15.5)
WBC: 15 10*3/uL — ABNORMAL HIGH (ref 4.0–10.5)
WBC: 7.2 10*3/uL (ref 4.0–10.5)
nRBC: 0 % (ref 0.0–0.2)
nRBC: 0 % (ref 0.0–0.2)

## 2022-04-24 LAB — PROTIME-INR
INR: 1 (ref 0.8–1.2)
Prothrombin Time: 12.7 seconds (ref 11.4–15.2)

## 2022-04-24 LAB — HEPATITIS PANEL, ACUTE
HCV Ab: NONREACTIVE
Hep A IgM: NONREACTIVE
Hep B C IgM: NONREACTIVE
Hepatitis B Surface Ag: NONREACTIVE

## 2022-04-24 LAB — LIPASE, BLOOD: Lipase: 25 U/L (ref 11–51)

## 2022-04-24 MED ORDER — FENTANYL CITRATE PF 50 MCG/ML IJ SOSY
50.0000 ug | PREFILLED_SYRINGE | Freq: Once | INTRAMUSCULAR | Status: AC
  Administered 2022-04-24: 50 ug via INTRAVENOUS
  Filled 2022-04-24: qty 1

## 2022-04-24 MED ORDER — PIPERACILLIN-TAZOBACTAM 3.375 G IVPB 30 MIN: 3.3750 g | Freq: Three times a day (TID) | INTRAVENOUS | Status: DC

## 2022-04-24 MED ORDER — FAMOTIDINE 20 MG PO TABS: 20.0000 mg | ORAL_TABLET | Freq: Every day | ORAL | Status: DC | PRN

## 2022-04-24 MED ORDER — ONDANSETRON HCL 4 MG PO TABS: 4.0000 mg | ORAL_TABLET | Freq: Four times a day (QID) | ORAL | Status: DC | PRN

## 2022-04-24 MED ORDER — SODIUM CHLORIDE 0.9 % IV SOLN
2.0000 g | Freq: Once | INTRAVENOUS | Status: AC
  Administered 2022-04-24: 2 g via INTRAVENOUS
  Filled 2022-04-24: qty 20

## 2022-04-24 MED ORDER — VORTIOXETINE HBR 5 MG PO TABS
10.0000 mg | ORAL_TABLET | Freq: Every day | ORAL | Status: DC
  Administered 2022-04-24 – 2022-04-25 (×2): 10 mg via ORAL
  Filled 2022-04-24 (×2): qty 2

## 2022-04-24 MED ORDER — SODIUM CHLORIDE 0.45 % IV SOLN: INTRAVENOUS | Status: DC

## 2022-04-24 MED ORDER — PANTOPRAZOLE SODIUM 40 MG IV SOLR: 40.0000 mg | INTRAVENOUS | Status: DC

## 2022-04-24 MED ORDER — IOHEXOL 300 MG/ML  SOLN
100.0000 mL | Freq: Once | INTRAMUSCULAR | Status: AC | PRN
  Administered 2022-04-24: 80 mL via INTRAVENOUS

## 2022-04-24 MED ORDER — ONDANSETRON HCL 4 MG/2ML IJ SOLN: 4.0000 mg | Freq: Four times a day (QID) | INTRAMUSCULAR | Status: DC | PRN

## 2022-04-24 MED ORDER — AMLODIPINE BESYLATE 5 MG PO TABS
2.5000 mg | ORAL_TABLET | Freq: Every morning | ORAL | Status: DC
  Administered 2022-04-24 – 2022-04-25 (×2): 2.5 mg via ORAL
  Filled 2022-04-24 (×2): qty 1

## 2022-04-24 MED ORDER — FENTANYL CITRATE PF 50 MCG/ML IJ SOSY
50.0000 ug | PREFILLED_SYRINGE | INTRAMUSCULAR | Status: DC | PRN
  Administered 2022-04-24: 50 ug via INTRAVENOUS
  Filled 2022-04-24: qty 1

## 2022-04-24 MED ORDER — METHYLPHENIDATE HCL ER (OSM) 18 MG PO TBCR
18.0000 mg | EXTENDED_RELEASE_TABLET | Freq: Every day | ORAL | Status: DC
  Filled 2022-04-24: qty 1

## 2022-04-24 MED ORDER — GADOBUTROL 1 MMOL/ML IV SOLN
9.0000 mL | Freq: Once | INTRAVENOUS | Status: AC | PRN
  Administered 2022-04-24: 9 mL via INTRAVENOUS

## 2022-04-24 MED ORDER — PANTOPRAZOLE SODIUM 40 MG IV SOLR
40.0000 mg | Freq: Once | INTRAVENOUS | Status: AC
  Administered 2022-04-24: 40 mg via INTRAVENOUS
  Filled 2022-04-24: qty 10

## 2022-04-24 MED ORDER — PIPERACILLIN-TAZOBACTAM 3.375 G IVPB
3.3750 g | Freq: Three times a day (TID) | INTRAVENOUS | Status: DC
Start: 2022-04-24 — End: 2022-04-25
  Administered 2022-04-24 – 2022-04-25 (×3): 3.375 g via INTRAVENOUS
  Filled 2022-04-24 (×3): qty 50

## 2022-04-24 NOTE — H&P (Addendum)
History and Physical    Patient: [name] [name] XHF:[id number] DOB: 1986/06/14 DOA: 04/24/2022 DOS: the patient was seen and examined on 04/24/2022 PCP: Silverio Decamp, MD  Patient coming from: Home  Chief Complaint:  Chief Complaint  Patient presents with   Abdominal Pain   HPI: [name] [name] is a 35 y.o. male with medical history significant of ADD, anxiety, hyperlipidemia, Raynaud's phenomenon, seasonal allergies, nephrolithiasis, chondrosarcoma who presented to the emergency department [facility] last night after developing suprasternal/epigastric pain that then became more intense in his RUQ and radiated to his right shoulder around 2000 or an hour after he ate dinner.  He was briefly short of breath and had mild diaphoresis.  He felt nauseous, took an Zofran ODT which helped with symptoms.  No emesis.  Denied diarrhea, constipation, melena or hematochezia.  No history of blood transfusions, hepatitis or alcohol abuse.  He believes he has been vaccinated against hepatitis B and hepatitis A, but is not sure. He denied fever, rhinorrhea, sore throat, wheezing or hemoptysis.  No chest pain, palpitations, PND, orthopnea or pitting edema of the lower extremities. No flank pain, dysuria, frequency or hematuria.  No polyuria, polydipsia, polyphagia or blurred vision.   ED course: Initial vital signs were temperature 98.3 F, pulse 89, respiration 18, BP 136/88 mmHg and O2 sat 100% on room air.  The patient received fentanyl 50 mcg IVP, ceftriaxone 2 g IVP and pantoprazole 40 mg IVP.  Lab work: CBC showed a white count of 15.0, hemoglobin 16.7 g deciliter platelets 276.  Lipase was normal.  CMP showed normal electrolytes.  Nonfasting glucose was 119 mg/dL.  AST was 203 and ALT 151 units/L, total bilirubin was 2.4 mg/deciliter and the rest of the hepatic functions were normal.  Imaging: CT abdomen/pelvis showed postsurgical changes of the resection and the previously seen mass in the anterolateral  right lung, but there were no acute intra-abdominal or intrapelvic pathology.  No bowel obstruction and the appendix looked normal.  RUQ US showed a focal 7 mm thickening of the gallbladder that could reflect a fold versus through wall thickening.  The rest of the gallbladder US examination was normal in appearance.  There was gallbladder sludge.  No shadowing stones.  There was suspicion for fatty liver disease as the liver echogenicity was mildly increased.   Review of Systems: As mentioned in the history of present illness. All other systems reviewed and are negative. Past Medical History:  Diagnosis Date   ADD (attention deficit disorder)    Anxiety    Family history of breast cancer    Family history of pancreatic cancer    Family history of prostate cancer    Heart murmur    History of chondrosarcoma    History of kidney stones    Hyperlipidemia    Raynaud disease    Seasonal allergies    Past Surgical History:  Procedure Laterality Date   CHEST WALL RECONSTRUCTION N/A 04/06/2021   Procedure: CHEST WALL RESECTION WITH MESH RECONSTRUCTION;  Surgeon: Lajuana Matte, MD;  Location: [facility];  Service: Thoracic;  Laterality: N/A;   INNER EAR SURGERY     Social History:  reports that he has never smoked. He has never used smokeless tobacco. He reports that he does not drink alcohol and does not use drugs.  No Known Allergies  Family History  Problem Relation Age of Onset   Hypertension Father    Prostate cancer Father        dx  late 72s   Melanoma Father        dx 67s, on wrist   Prostate cancer Paternal Uncle        dx 32s   Melanoma Paternal Uncle    Breast cancer Maternal Grandmother    Pancreatic cancer Maternal Grandmother    Stroke Maternal Grandfather    Hypertension Paternal Grandmother    Other Paternal Grandmother        benign brain tumor   Alzheimer's disease Paternal Grandfather    Prostate cancer Paternal Grandfather     Prior to Admission medications    Medication Sig Start Date End Date Taking? Authorizing Provider  acetaminophen (TYLENOL) 500 MG tablet Take 500 mg by mouth every 6 (six) hours as needed for headache.   Yes [provider]  amLODipine (NORVASC) 2.5 MG tablet TAKE 1 TABLET BY MOUTH ONCE DAILY Patient taking differently: Take 2.5 mg by mouth every morning. 01/10/22 01/10/23 Yes Silverio Decamp, MD  Famotidine (PEPCID PO) Take 1 tablet by mouth daily as needed (heartburn).   Yes [provider]  fluticasone (FLONASE) 50 MCG/ACT nasal spray Place 1 spray into both nostrils daily as needed for allergies or rhinitis. 07/06/21  Yes Raylene Everts, MD  ibuprofen (ADVIL) 200 MG tablet Take 400 mg by mouth every 6 (six) hours as needed (pain).   Yes [provider]  meloxicam (MOBIC) 15 MG tablet Take 1 tablet (15 mg total) by mouth daily. 06/07/21 06/11/22 Yes Silverio Decamp, MD  methylphenidate (CONCERTA) 18 MG PO CR tablet Take 1 tablet (18 mg total) by mouth daily. 03/08/22 09/04/22 Yes Silverio Decamp, MD  niacin 500 MG tablet Take 500 mg by mouth at bedtime.   Yes [provider]  ondansetron (ZOFRAN-ODT) 8 MG disintegrating tablet Take 1 tablet (8 mg total) by mouth every 8 (eight) hours as needed for nausea. 07/28/21  Yes Silverio Decamp, MD  vortioxetine HBr (TRINTELLIX) 10 MG TABS tablet Take 1 tablet (10 mg total) by mouth daily. 01/10/22 01/10/23 Yes Silverio Decamp, MD  WEGOVY 2.4 MG/0.75ML SOAJ Inject 2.4 mg into the skin once a week. Patient taking differently: Inject 2.4 mg into the skin every Monday. 11/18/21  Yes Silverio Decamp, MD  Blood Pressure Monitoring (OMRON 3 SERIES BP MONITOR) DEVI Use as directed 04/08/22   Clementeen Graham, [place]  COVID-19 At Home Antigen Test [facility] COVID-19 HOME TEST) KIT Use as directed within package instructions 09/06/21   Margie Ege, [facility]  COVID-19 mRNA bivalent vaccine, Pfizer, (PFIZER COVID-19 VAC BIVALENT)  injection Inject into the muscle. Patient not taking: Reported on 04/24/2022 07/02/21   Carlyle Basques, MD  promethazine (PHENERGAN) 25 MG tablet Take 1 tablet (25 mg total) by mouth every 6 (six) hours as needed for nausea. Patient not taking: Reported on 03/15/2021 03/10/21 03/16/21  Silverio Decamp, MD  sertraline (ZOLOFT) 25 MG tablet Take 1 tablet (25 mg total) by mouth daily. 11/30/20 12/16/20  Silverio Decamp, MD  topiramate (TOPAMAX) 50 MG tablet TAKE 1 TABLET BY MOUTH TWICE DAILY 11/10/20 11/30/20  Silverio Decamp, MD    Physical Exam: Vitals:   04/24/22 0645 04/24/22 0700 04/24/22 0800 04/24/22 0830  BP: 120/79 123/82 130/81 (!) 142/86  Pulse: 69 72 70 72  Resp: 18  16 18   Temp:   98 F (36.7 C) 98.6 F (37 C)  TempSrc:   Oral Oral  SpO2: 100% 97% 98% 100%  Weight:  Height:       Physical Exam Vitals and nursing note reviewed.  Constitutional:      General: He is awake. He is not in acute distress.    Appearance: He is not ill-appearing.  HENT:     Head: Normocephalic.     Mouth/Throat:     Mouth: Mucous membranes are moist.  Eyes:     General: Scleral icterus present.     Pupils: Pupils are equal, round, and reactive to light.  Neck:     Vascular: No JVD.  Cardiovascular:     Rate and Rhythm: Normal rate and regular rhythm.     Heart sounds: S1 normal and S2 normal.  Pulmonary:     Effort: Pulmonary effort is normal.     Breath sounds: Normal breath sounds. No wheezing, rhonchi or rales.  Abdominal:     General: There is no distension.     Palpations: Abdomen is soft.     Tenderness: There is abdominal tenderness in the right upper quadrant and epigastric area. There is right CVA tenderness. There is no left CVA tenderness.  Musculoskeletal:     Cervical back: Neck supple.     Right lower leg: No edema.     Left lower leg: No edema.  Skin:    General: Skin is warm and dry.  Neurological:     General: No focal deficit present.     Mental  Status: He is alert and oriented to person, place, and time.  Psychiatric:        Mood and Affect: Mood normal.        Behavior: Behavior normal. Behavior is cooperative.   Data Reviewed:  There are no new results to review at this time.  Assessment and Plan: Principal Problem:   Abdominal pain Associated with:   Hyperbilirubinemia Inpatient/MedSurg. Clear liquid diet per GI. Continue IV fluids. Analgesics as needed. Antiemetics as needed. Pantoprazole 40 mg IVP every 24 hours. Check acute hepatitis panel. Check PT and INR. Follow-up CBC and CMP in AM. MRCP has been ordered.  Active Problems:   Adult ADHD Continue Concerta 18 mg p.o. daily. Continue vortioxetine 10 mg p.o. daily. Follow-up with psychiatry as scheduled.    Raynaud phenomenon Continue amlodipine 2.5 mg p.o. daily.    Overweight BMI was 29.53 kg/m. Continue lifestyle modifications. Hold Wegovy weekly injections. Follow-up with primary care provider.     Advance Care Planning:   Code Status: Full Code   Consults: [facility] (Dr. Jonelle Sports).  Family Communication:   Severity of Illness: The appropriate patient status for this patient is INPATIENT. Inpatient status is judged to be reasonable and necessary in order to provide the required intensity of service to ensure the patient's safety. The patient's presenting symptoms, physical exam findings, and initial radiographic and laboratory data in the context of their chronic comorbidities is felt to place them at high risk for further clinical deterioration. Furthermore, it is not anticipated that the patient will be medically stable for discharge from the hospital within 2 midnights of admission.   * I certify that at the point of admission it is my clinical judgment that the patient will require inpatient hospital care spanning beyond 2 midnights from the point of admission due to high intensity of service, high risk for further deterioration and high  frequency of surveillance required.*  Author: Reubin Milan, MD 04/24/2022 10:44 AM  For on call review [email or web address].   This document was prepared using Set designer  software and may contain some unintended transcription errors.

## 2022-04-24 NOTE — Consult Note (Addendum)
[facility] Consult  Referring Provider: [facility] hospitalist/Dr. Olevia Bowens Primary Care Physician:  Silverio Decamp, MD Primary Gastroenterologist: Althia Forts  Reason for Consultation: Abdominal pain, elevated LFTs  HPI: [name] [name] is a 36 y.o. male was in his usual state of health until last night when after eating Chinese food for dinner he developed severe epigastric pain which radiated to upper abdomen, became generalized and then radiated to his back.  He noticed that sitting forward helped with the pain while movement made the pain worse.  This was associated with nausea for which she took Zofran.  Patient denies vomiting or having fever.  Once he presented to the ER and was given pain medications, the pain has improved and is almost resolved.  Patient has never experienced similar pain in the past.  He denies use of alcohol, smoking, use of recreational drugs. Patient has experienced infrequent heartburn but denies acid reflux, difficulty swallowing, pain on swallowing. He has lost 30 pounds since January 2023 since starting [facility] weekly injections. Patient denies change in bowel habits, denies noticing blood in stool or black stools. No prior GI procedures. Patient takes meloxicam daily. 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  Patient reports jaundice at birth. Denies history of liver disease, or family history of liver disease.  Past Medical History:  Diagnosis Date   ADD (attention deficit disorder)    Anxiety    Family history of breast cancer    Family history of pancreatic cancer    Family history of prostate cancer    Heart murmur    History of chondrosarcoma    History of kidney stones    Hyperlipidemia    Raynaud disease    Seasonal allergies     Past Surgical History:  Procedure Laterality Date   CHEST WALL RECONSTRUCTION N/A 04/06/2021   Procedure: CHEST WALL RESECTION WITH MESH RECONSTRUCTION;  Surgeon: Lajuana Matte, MD;   Location: [facility];  Service: Thoracic;  Laterality: N/A;   INNER EAR SURGERY      Prior to Admission medications   Medication Sig Start Date End Date Taking? Authorizing Provider  acetaminophen (TYLENOL) 500 MG tablet Take 500 mg by mouth every 6 (six) hours as needed for headache.   Yes [provider]  amLODipine (NORVASC) 2.5 MG tablet TAKE 1 TABLET BY MOUTH ONCE DAILY Patient taking differently: Take 2.5 mg by mouth every morning. 01/10/22 01/10/23 Yes Silverio Decamp, MD  Famotidine (PEPCID PO) Take 1 tablet by mouth daily as needed (heartburn).   Yes [provider]  fluticasone (FLONASE) 50 MCG/ACT nasal spray Place 1 spray into both nostrils daily as needed for allergies or rhinitis. 07/06/21  Yes Raylene Everts, MD  ibuprofen (ADVIL) 200 MG tablet Take 400 mg by mouth every 6 (six) hours as needed (pain).   Yes [provider]  meloxicam (MOBIC) 15 MG tablet Take 1 tablet (15 mg total) by mouth daily. 06/07/21 06/11/22 Yes Silverio Decamp, MD  methylphenidate (CONCERTA) 18 MG PO CR tablet Take 1 tablet (18 mg total) by mouth daily. 03/08/22 09/04/22 Yes Silverio Decamp, MD  niacin 500 MG tablet Take 500 mg by mouth at bedtime.   Yes [provider]  ondansetron (ZOFRAN-ODT) 8 MG disintegrating tablet Take 1 tablet (8 mg total) by mouth every 8 (eight) hours as needed for nausea. 07/28/21  Yes Silverio Decamp, MD  vortioxetine HBr (TRINTELLIX) 10 MG TABS tablet Take 1 tablet (10 mg total) by mouth daily.  01/10/22 01/10/23 Yes Silverio Decamp, MD  WEGOVY 2.4 MG/0.75ML SOAJ Inject 2.4 mg into the skin once a week. Patient taking differently: Inject 2.4 mg into the skin every Monday. 11/18/21  Yes Silverio Decamp, MD  Blood Pressure Monitoring (OMRON 3 SERIES BP MONITOR) DEVI Use as directed 04/08/22   Clementeen Graham, [place]  COVID-19 At Home Antigen Test [facility] COVID-19 HOME TEST) KIT Use as directed within package  instructions 09/06/21   Margie Ege, [facility]  COVID-19 mRNA bivalent vaccine, Pfizer, (PFIZER COVID-19 VAC BIVALENT) injection Inject into the muscle. Patient not taking: Reported on 04/24/2022 07/02/21   Carlyle Basques, MD  promethazine (PHENERGAN) 25 MG tablet Take 1 tablet (25 mg total) by mouth every 6 (six) hours as needed for nausea. Patient not taking: Reported on 03/15/2021 03/10/21 03/16/21  Silverio Decamp, MD  sertraline (ZOLOFT) 25 MG tablet Take 1 tablet (25 mg total) by mouth daily. 11/30/20 12/16/20  Silverio Decamp, MD  topiramate (TOPAMAX) 50 MG tablet TAKE 1 TABLET BY MOUTH TWICE DAILY 11/10/20 11/30/20  Silverio Decamp, MD    Current Facility-Administered Medications  Medication Dose Route Frequency Provider Last Rate Last Admin   0.45 % sodium chloride infusion   Intravenous Continuous Reubin Milan, MD 100 mL/hr at 04/24/22 1016 New Bag at 04/24/22 1016   ondansetron (ZOFRAN) tablet 4 mg  4 mg Oral Q6H PRN Reubin Milan, MD       Or   ondansetron [facility]) injection 4 mg  4 mg Intravenous Q6H PRN Reubin Milan, MD       [START ON 04/25/2022] pantoprazole (PROTONIX) injection 40 mg  40 mg Intravenous Q24H Reubin Milan, MD       piperacillin-tazobactam (ZOSYN) IVPB 3.375 g  3.375 g Intravenous Q8H Suzzanne Cloud, RPH        Allergies as of 04/24/2022   (No Known Allergies)    Family History  Problem Relation Age of Onset   Hypertension Father    Prostate cancer Father        dx late 64s   Melanoma Father        dx 72s, on wrist   Prostate cancer Paternal Uncle        dx 26s   Melanoma Paternal Uncle    Breast cancer Maternal Grandmother    Pancreatic cancer Maternal Grandmother    Stroke Maternal Grandfather    Hypertension Paternal Grandmother    Other Paternal Grandmother        benign brain tumor   Alzheimer's disease Paternal Grandfather    Prostate cancer Paternal Grandfather     Social History   Socioeconomic History    Marital status: Single    Spouse name: Not on file   Number of children: Not on file   Years of education: Not on file   Highest education level: Not on file  Occupational History   Not on file  Tobacco Use   Smoking status: Never   Smokeless tobacco: Never  Vaping Use   Vaping Use: Never used  Substance and Sexual Activity   Alcohol use: No   Drug use: No   Sexual activity: Never  Other Topics Concern   Not on file  Social History Narrative   Not on file   Social Determinants of Health   Financial Resource Strain: Not on file  Food Insecurity: Not on file  Transportation Needs: Not on file  Physical Activity: Not on file  Stress: Not on  file  Social Connections: Not on file  Intimate Partner Violence: Not At Risk (06/03/2021)   Humiliation, Afraid, Rape, and Kick questionnaire    Fear of Current or Ex-Partner: No    Emotionally Abused: No    Physically Abused: No    Sexually Abused: No    Review of Systems: Positive for: GI: Described in detail in HPI.    Gen: Denies any fever, chills, rigors, night sweats, anorexia, fatigue, weakness, malaise, involuntary weight loss, and sleep disorder CV: Denies chest pain, angina, palpitations, syncope, orthopnea, PND, peripheral edema, and claudication. Resp: Denies dyspnea, cough, sputum, wheezing, coughing up blood. GU : Denies urinary burning, blood in urine, urinary frequency, urinary hesitancy, nocturnal urination, and urinary incontinence. MS: Denies joint pain or swelling.  Denies muscle weakness, cramps, atrophy.  Derm: Denies rash, itching, oral ulcerations, hives, unhealing ulcers.  Psych: History of ADHD and anxiety Heme: Denies bruising, bleeding, and enlarged lymph nodes. Neuro:  Denies any headaches, dizziness, paresthesias. Endo:  Denies any problems with DM, thyroid, adrenal function.  Physical Exam: Vital signs in last 24 hours: Temp:  [98 F (36.7 C)-98.6 F (37 C)] 98.6 F (37 C) (07/30 0830) Pulse  Rate:  [69-91] 72 (07/30 0830) Resp:  [16-18] 18 (07/30 0830) BP: (96-142)/(58-88) 142/86 (07/30 0830) SpO2:  [97 %-100 %] 100 % (07/30 0830) Weight:  [90.7 kg] 90.7 kg (07/30 0035)    General:   Alert,  Well-developed, well-nourished, pleasant and cooperative in NAD Head:  Normocephalic and atraumatic. Eyes: Mild icterus,   conjunctiva pink. Ears:  Normal auditory acuity. Nose:  No deformity, discharge,  or lesions. Mouth:  No deformity or lesions.  Oropharynx pink & moist. Neck:  Supple; no masses or thyromegaly. Lungs:  Clear throughout to auscultation.   No wheezes, crackles, or rhonchi. No acute distress. Heart:  Regular rate and rhythm; no murmurs, clicks, rubs,  or gallops. Extremities:  Without clubbing or edema. Neurologic:  Alert and  oriented x4;  grossly normal neurologically. Skin:  Intact without significant lesions or rashes. Psych:  Alert and cooperative. Normal mood and affect. Abdomen:  Soft, nontender and nondistended. No masses, hepatosplenomegaly or hernias noted. Normal bowel sounds, without guarding, and without rebound.         Lab Results: Recent Labs    04/24/22 0043 04/24/22 0640  WBC 15.0* 7.2  HGB 16.7 15.5  HCT 47.7 45.2  PLT 276 270   BMET Recent Labs    04/24/22 0043 04/24/22 0640  NA 140 141  K 3.8 4.1  CL 104 104  CO2 26 27  GLUCOSE 119* 95  BUN 13 12  CREATININE 1.17 1.07  CALCIUM 9.1 9.6   LFT Recent Labs    04/24/22 0640  PROT 6.4*  ALBUMIN 4.5  AST 347*  ALT 292*  ALKPHOS 107  BILITOT 2.9*   PT/INR Recent Labs    04/24/22 0917  LABPROT 12.7  INR 1.0    Studies/Results: US Abdomen Limited RUQ (LIVER/GB)  Result Date: 04/24/2022 CLINICAL DATA:  Abnormal liver function tests EXAM: ULTRASOUND ABDOMEN LIMITED RIGHT UPPER QUADRANT COMPARISON:  Same day CT FINDINGS: Gallbladder: There is a focal area of thickening along the gallbladder wall measuring 7 mm without gallbladder wall fluid or edema. Remainder of the  gallbladder wall is normal in appearance. Minimal layering sludge within the gallbladder. No gallstones or pericholecystic fluid visualized. No sonographic Murphy sign noted by sonographer. Common bile duct: Diameter: 3 mm. Liver: No focal lesion identified. Mildly increased hepatic parenchymal  echogenicity. Portal vein is patent on color Doppler imaging with normal direction of blood flow towards the liver. Other: None. IMPRESSION: 1. Focal area of thickening along the gallbladder wall measuring up to 7 mm without gallbladder wall fluid or edema. It is uncertain if this reflects a fold of the gallbladder wall or true wall thickening remainder of the gallbladder wall is normal in appearance. Gallbladder sludge is present. No shadowing stones. No Murphy sign on exam. 2. The echogenicity of the liver is mildly increased. This is a nonspecific finding but is most commonly seen with fatty infiltration of the liver. There are no obvious focal liver lesions. Electronically Signed   By: Davina Poke D.O.   On: 04/24/2022 10:02   CT Abdomen Pelvis W Contrast  Result Date: 04/24/2022 CLINICAL DATA:  Epigastric pain.  History of chondrosarcoma. EXAM: CT ABDOMEN AND PELVIS WITH CONTRAST TECHNIQUE: Multidetector CT imaging of the abdomen and pelvis was performed using the standard protocol following bolus administration of intravenous contrast. RADIATION DOSE REDUCTION: This exam was performed according to the departmental dose-optimization program which includes automated exposure control, adjustment of the mA and/or kV according to patient size and/or use of iterative reconstruction technique. CONTRAST:  59m OMNIPAQUE IOHEXOL 300 MG/ML  SOLN COMPARISON:  CT abdomen pelvis dated 03/10/2021. FINDINGS: Lower chest: An area of pleural base consolidation with air bronchogram in the anterolateral right middle lobe corresponds to the previously seen mass and likely represents postsurgical scarring related to resection of  the previously seen mass. There is mild thickening of the adjacent pleura. The visualized left lung base is clear. No intra-abdominal free air or free fluid. Hepatobiliary: No focal liver abnormality is seen. No gallstones, gallbladder wall thickening, or biliary dilatation. Pancreas: Unremarkable. No pancreatic ductal dilatation or surrounding inflammatory changes. Spleen: Normal in size without focal abnormality. Adrenals/Urinary Tract: Adrenal glands are unremarkable. Kidneys are normal, without renal calculi, focal lesion, or hydronephrosis. Bladder is unremarkable. Stomach/Bowel: There is no bowel obstruction or active inflammation. The appendix is normal. Vascular/Lymphatic: The abdominal aorta and IVC are unremarkable. No portal venous gas. There is no adenopathy. Reproductive: The prostate and seminal vesicles are grossly unremarkable. No pelvic mass. Other: None Musculoskeletal: No acute osseous pathology. Multiple small sclerotic bone lesions involving the pelvic bone and visualized proximal femurs similar to prior CT, likely bone island. IMPRESSION: 1. No acute intra-abdominal or pelvic pathology. No bowel obstruction. Normal appendix. 2. Postsurgical changes of resection of the previously seen mass in the anterolateral right lung. Electronically Signed   By: AAnner CreteM.D.   On: 04/24/2022 02:22    Impression: Acute epigastric pain Elevated LFTs, T. bili/AST/ALT/ALP of 2.4/203/151/102 and 2.9/347/292/107 today  Normal lipase 25 WBC 15, improved to 7.2 Normal PT/INR, normal platelet  CT abdomen pelvis with contrast on admission: Unremarkable pancreas, no gallstones or gallbladder wall thickening or biliary dilation Postsurgical changes of resection in anterior lateral right lung  Ultrasound: Focal area of thickening along gallbladder wall measuring 7 mm without gallbladder wall fluid or edema, gallbladder sludge, fatty liver, no liver lesions, CBD 3 mm   Plan: Unclear cause of  abdominal pain and elevated LFTs?  Possibly passed CBD stone/microlithiasis/sludge  Will get MRCP for further evaluation of elevated LFTs.  Patient has been on Wegovy/semaglutide weekly injections since January 2023 for weight loss, which can cause pancreatitis/cholelithiasis/cholecystitis/abdominal pain/elevated ALT.  If MRCP is unremarkable, will need other evaluation of elevated LFTs. We will start patient on clear liquid diet.   LOS: 0 days  Ronnette Juniper, MD  04/24/2022, 10:36 AM

## 2022-04-24 NOTE — ED Provider Notes (Signed)
[facility] EMERGENCY DEPT  Provider Note  CSN: [id number] Arrival date & time: 04/24/22 4431  History Chief Complaint  Patient presents with  . Abdominal Pain    [name] [name] is a 36 y.o. male presents for evaluation of several hours of waxing and waning but increasing epigastric pain, now radiating to RUQ and R flank. Initially had some nausea, but took a zofran and no vomiting. No recent similar issues, no diarrhea, constipation or melena. No fevers. No prior abdominal surgeries. Pain started after eating dinner.    Home Medications Prior to Admission medications   Medication Sig Start Date End Date Taking? Authorizing Provider  amLODipine (NORVASC) 2.5 MG tablet TAKE 1 TABLET BY MOUTH ONCE DAILY 01/10/22 01/10/23  Silverio Decamp, MD  Blood Pressure Monitoring (OMRON 3 SERIES BP MONITOR) DEVI Use as directed 04/08/22   Clementeen Graham, [place]  COVID-19 At Home Antigen Test [facility] COVID-19 HOME TEST) KIT Use as directed within package instructions 09/06/21   Margie Ege, [facility]  COVID-19 mRNA bivalent vaccine, Pfizer, (PFIZER COVID-19 VAC BIVALENT) injection Inject into the muscle. 07/02/21   Carlyle Basques, MD  fluticasone (FLONASE) 50 MCG/ACT nasal spray Place 1 spray into both nostrils daily as needed for allergies or rhinitis. 07/06/21   Raylene Everts, MD  ibuprofen (ADVIL) 200 MG tablet Take 200-400 mg by mouth every 6 (six) hours as needed for moderate pain.    [provider]  meloxicam (MOBIC) 15 MG tablet Take 1 tablet (15 mg total) by mouth daily. 06/07/21 06/11/22  Silverio Decamp, MD  methylphenidate (CONCERTA) 18 MG PO CR tablet Take 1 tablet (18 mg total) by mouth daily. 03/08/22 09/04/22  Silverio Decamp, MD  niacin 500 MG tablet Take 500 mg by mouth at bedtime.    [provider]  ondansetron (ZOFRAN-ODT) 8 MG disintegrating tablet Take 1 tablet (8 mg total) by mouth every 8 (eight) hours as needed for nausea.  07/28/21   Silverio Decamp, MD  vortioxetine HBr (TRINTELLIX) 10 MG TABS tablet Take 1 tablet (10 mg total) by mouth daily. 01/10/22 01/10/23  Silverio Decamp, MD  WEGOVY 2.4 MG/0.75ML SOAJ Inject 2.4 mg into the skin once a week. Patient not taking: Reported on 01/17/2022 11/18/21   Silverio Decamp, MD  promethazine (PHENERGAN) 25 MG tablet Take 1 tablet (25 mg total) by mouth every 6 (six) hours as needed for nausea. Patient not taking: Reported on 03/15/2021 03/10/21 03/16/21  Silverio Decamp, MD  sertraline (ZOLOFT) 25 MG tablet Take 1 tablet (25 mg total) by mouth daily. 11/30/20 12/16/20  Silverio Decamp, MD  topiramate (TOPAMAX) 50 MG tablet TAKE 1 TABLET BY MOUTH TWICE DAILY 11/10/20 11/30/20  Silverio Decamp, MD     Allergies    Patient has no known allergies.   Review of Systems   Review of Systems Please see HPI for pertinent positives and negatives  Physical Exam BP 129/83   Pulse 91   Temp 98.3 F (36.8 C) (Oral)   Resp 18   Ht _0  (1.753 m)   Wt 90.7 kg   SpO2 99%   BMI 29.53 kg/m   Physical Exam Vitals and nursing note reviewed.  Constitutional:      Appearance: Normal appearance.  HENT:     Head: Normocephalic and atraumatic.     Nose: Nose normal.     Mouth/Throat:     Mouth: Mucous membranes are moist.  Eyes:  Extraocular Movements: Extraocular movements intact.     Conjunctiva/sclera: Conjunctivae normal.  Cardiovascular:     Rate and Rhythm: Normal rate.  Pulmonary:     Effort: Pulmonary effort is normal.     Breath sounds: Normal breath sounds.  Abdominal:     General: Abdomen is flat.     Palpations: Abdomen is soft.     Tenderness: There is abdominal tenderness in the right upper quadrant, epigastric area and left lower quadrant. There is no guarding. Positive signs include Murphy's sign. Negative signs include McBurney's sign.  Musculoskeletal:        General: No swelling. Normal range of motion.      Cervical back: Neck supple.  Skin:    General: Skin is warm and dry.  Neurological:     General: No focal deficit present.     Mental Status: He is alert.  Psychiatric:        Mood and Affect: Mood normal.     ED Results / Procedures / Treatments   EKG None  Procedures Procedures  Medications Ordered in the ED Medications  pantoprazole (PROTONIX) injection 40 mg (40 mg Intravenous Given 04/24/22 0105)  cefTRIAXone (ROCEPHIN) 2 g in sodium chloride 0.9 % 100 mL IVPB (0 g Intravenous Stopped 04/24/22 0251)  fentaNYL (SUBLIMAZE) injection 50 mcg (50 mcg Intravenous Given 04/24/22 0152)  iohexol (OMNIPAQUE) 300 MG/ML solution 100 mL (80 mLs Intravenous Contrast Given 04/24/22 0202)    Initial Impression and Plan  Patient here with epigstric and RUQ pain. Concerning for biliary colic or pancreatitis. Less likely PUD or GERD. [place] is not available at this time. Will check labs and send for CT. He drove himself so will need to avoid sedating medications.   ED Course   Clinical Course as of 04/24/22 0657  Nancy Fetter Apr 24, 2022  0115 CBC with leukocytosis.  [CS]  1173 CMP with elevated liver enzymes and bilirubin, concerning for biliary obstruction. Lipase is normal. Will being Abx for concerns of cholangitis.  [CS]  0230 I personally viewed the images from radiology studies and agree with radiologist interpretation: CT without any acute process, including no signs of biliary dilatation. Will plan admission for pain control, Abx and will likely need MRCP. No prior GI doctors.  [CS]  [phone] Spoke with Dr. Myna Hidalgo, Hospitalist, who will accept for admission.  [CS]    Clinical Course User Index [CS] Truddie Hidden, MD     MDM Rules/Calculators/A&P Medical Decision Making Problems Addressed: Cholangitis: acute illness or injury  Amount and/or Complexity of Data Reviewed Labs: ordered. Decision-making details documented in ED Course. Radiology: ordered and independent interpretation  performed. Decision-making details documented in ED Course.  Risk Prescription drug management. Parenteral controlled substances. Decision regarding hospitalization.    Final Clinical Impression(s) / ED Diagnoses Final diagnoses:  Cholangitis    Rx / DC Orders ED Discharge Orders     None        Truddie Hidden, MD 04/24/22 [phone]

## 2022-04-24 NOTE — ED Triage Notes (Signed)
Pt reports generalized abdominal pain that started after dinner today at 2000. Denies N/V/D. Also reports a dull "achey" pain in his back.

## 2022-04-24 NOTE — ED Notes (Signed)
Tx to [facility] via [facility] with phone in hand

## 2022-04-25 ENCOUNTER — Other Ambulatory Visit (HOSPITAL_BASED_OUTPATIENT_CLINIC_OR_DEPARTMENT_OTHER): Payer: Self-pay

## 2022-04-25 DIAGNOSIS — R1013 Epigastric pain: Secondary | ICD-10-CM | POA: Diagnosis not present

## 2022-04-25 DIAGNOSIS — Z6829 Body mass index (BMI) 29.0-29.9, adult: Secondary | ICD-10-CM | POA: Diagnosis not present

## 2022-04-25 DIAGNOSIS — R109 Unspecified abdominal pain: Secondary | ICD-10-CM | POA: Diagnosis not present

## 2022-04-25 DIAGNOSIS — F909 Attention-deficit hyperactivity disorder, unspecified type: Secondary | ICD-10-CM | POA: Diagnosis not present

## 2022-04-25 DIAGNOSIS — K8309 Other cholangitis: Secondary | ICD-10-CM | POA: Diagnosis not present

## 2022-04-25 DIAGNOSIS — I7300 Raynaud's syndrome without gangrene: Secondary | ICD-10-CM | POA: Diagnosis not present

## 2022-04-25 DIAGNOSIS — E663 Overweight: Secondary | ICD-10-CM | POA: Diagnosis not present

## 2022-04-25 DIAGNOSIS — R7989 Other specified abnormal findings of blood chemistry: Secondary | ICD-10-CM | POA: Diagnosis not present

## 2022-04-25 DIAGNOSIS — Z79899 Other long term (current) drug therapy: Secondary | ICD-10-CM | POA: Diagnosis not present

## 2022-04-25 DIAGNOSIS — R945 Abnormal results of liver function studies: Secondary | ICD-10-CM | POA: Diagnosis not present

## 2022-04-25 LAB — COMPREHENSIVE METABOLIC PANEL
ALT: 270 U/L — ABNORMAL HIGH (ref 0–44)
AST: 159 U/L — ABNORMAL HIGH (ref 15–41)
Albumin: 3.6 g/dL (ref 3.5–5.0)
Alkaline Phosphatase: 104 U/L (ref 38–126)
Anion gap: 8 (ref 5–15)
BUN: 9 mg/dL (ref 6–20)
CO2: 23 mmol/L (ref 22–32)
Calcium: 8.4 mg/dL — ABNORMAL LOW (ref 8.9–10.3)
Chloride: 107 mmol/L (ref 98–111)
Creatinine, Ser: 1.01 mg/dL (ref 0.61–1.24)
GFR, Estimated: >60 mL/min (ref >60)
Glucose, Bld: 88 mg/dL (ref 70–99)
Potassium: 3.7 mmol/L (ref 3.5–5.1)
Sodium: 138 mmol/L (ref 135–145)
Total Bilirubin: 2 mg/dL — ABNORMAL HIGH (ref 0.3–1.2)
Total Protein: 6 g/dL — ABNORMAL LOW (ref 6.5–8.1)

## 2022-04-25 LAB — HIV ANTIBODY (ROUTINE TESTING W REFLEX): HIV Screen 4th Generation wRfx: NONREACTIVE

## 2022-04-25 LAB — CBC
HCT: 43.5 % (ref 39.0–52.0)
Hemoglobin: 14.8 g/dL (ref 13.0–17.0)
MCH: 30.6 pg (ref 26.0–34.0)
MCHC: 34 g/dL (ref 30.0–36.0)
MCV: 89.9 fL (ref 80.0–100.0)
Platelets: 233 10*3/uL (ref 150–400)
RBC: 4.84 MIL/uL (ref 4.22–5.81)
RDW: 12.4 % (ref 11.5–15.5)
WBC: 5.5 10*3/uL (ref 4.0–10.5)
nRBC: 0 % (ref 0.0–0.2)

## 2022-04-25 MED ORDER — PANTOPRAZOLE SODIUM 40 MG PO TBEC
40.0000 mg | DELAYED_RELEASE_TABLET | Freq: Every day | ORAL | 0 refills | Status: DC
  Filled 2022-04-25: qty 30, 30d supply, fill #0

## 2022-04-25 MED ORDER — PANTOPRAZOLE SODIUM 40 MG PO TBEC: 40.0000 mg | DELAYED_RELEASE_TABLET | Freq: Every day | ORAL | Status: DC

## 2022-04-25 NOTE — Consult Note (Signed)
   [facility] Inpatient Consult   04/25/2022  [name] [name] June 14, 1986 [id number]    [facility] [ACO] Patient: [name]  *Remote coverage review for post hospital needs - patient at [facility]  Primary Care Provider:  Silverio Decamp, MD, with [facility] is an Embedded provider who is listed for the transition of care follow up.  Brief review for needs.     Plan: Patient is to follow up with PCP for post hospital follow up   For additional questions or referrals please contact:   Natividad Brood, RN BSN [facility] Liaison  [phone] business mobile phone Toll free office [phone]  Fax number: [phone] [email or web address]

## 2022-04-25 NOTE — Progress Notes (Signed)
[facility]enterology Progress Note  [name] [name] 36 y.o. [birth year]  CC: Elevated LFTs   Subjective: Patient denies abdominal pain, nausea, vomiting.  Tolerating clear liquid diet without difficulty.  States the only new medication he has had in the last year is the [facility], denies herbal supplements  ROS : Review of Systems  Constitutional:  Negative for chills, fever and weight loss.  Gastrointestinal:  Negative for abdominal pain, blood in stool, constipation, diarrhea, heartburn, melena, nausea and vomiting.      Objective: Vital signs in last 24 hours: Vitals:   04/24/22 2042 04/25/22 0537  BP: 127/78 117/71  Pulse: 74 76  Resp: 18 18  Temp: 98.9 F (37.2 C) 98.2 F (36.8 C)  SpO2: 98% 99%    Physical Exam:  General:  Alert, cooperative, no distress, appears stated age  Head:  Normocephalic, without obvious abnormality, atraumatic  Eyes:  Anicteric sclera, EOM's intact  Lungs:   Clear to auscultation bilaterally, respirations unlabored  Heart:  Regular rate and rhythm, S1, S2 normal  Abdomen:   Soft, non-tender, bowel sounds active all four quadrants,  no masses,     Lab Results: Recent Labs    04/24/22 0640 04/25/22 0425  NA 141 138  K 4.1 3.7  CL 104 107  CO2 27 23  GLUCOSE 95 88  BUN 12 9  CREATININE 1.07 1.01  CALCIUM 9.6 8.4*   Recent Labs    04/24/22 0640 04/25/22 0425  AST 347* 159*  ALT 292* 270*  ALKPHOS 107 104  BILITOT 2.9* 2.0*  PROT 6.4* 6.0*  ALBUMIN 4.5 3.6   Recent Labs    04/24/22 0043 04/24/22 0640 04/25/22 0425  WBC 15.0* 7.2 5.5  NEUTROABS 13.1* 4.7  --   HGB 16.7 15.5 14.8  HCT 47.7 45.2 43.5  MCV 88.0 88.3 89.9  PLT 276 270 233   Recent Labs    04/24/22 0917  LABPROT 12.7  INR 1.0      Assessment Acute epigastric pain, elevated LFTs -AST 159/ALT 270/alk phos 104, improving -T. bili 2.0, improving -Negative hepatitis panel -Normal lipase -Resolved leukocytosis, no anemia -CT abdomen pelvis with  contrast was unrevealing -Abdominal ultrasound showed focal thickening along the gallbladder wall, gallbladder sludge, no Murphy sign.  Possibly fatty liver -MRCP shows no acute finding.  No biliary ductal dilation or choledocholithiasis.  Normal pancreas  Plan: No further pain at this time.  LFTs appear to be improving.  MRCP is negative for ductal dilation, choledocholithiasis, cholelithiasis and shows a normal pancreas.  At this time suspect acute elevation in LFTs with pain could be side effect from [facility] Continue to monitor LFTs Can advance diet as tolerated [facility] will follow  Garnette Scheuermann PA-C 04/25/2022, 9:10 AM  Contact #  [phone]

## 2022-04-25 NOTE — Progress Notes (Signed)
Reviewed written d/c instructions w pt and all questions answered. He verbalized understanding. D/ C via w/c w all belongings in stable condition. 

## 2022-04-25 NOTE — Discharge Summary (Signed)
Physician Discharge Summary  [name] [name]:[id number] DOB: 1985-10-29 DOA: 04/24/2022  PCP: Silverio Decamp, MD  Admit date: 04/24/2022 Discharge date: 04/25/2022  Admitted From: Home Disposition: Home  Recommendations for Outpatient Follow-up:  Follow up with PCP in 1 week with repeat CMP Outpatient follow-up with GI Follow up in ED if symptoms worsen or new appear   Home Health: No Equipment/Devices: None  Discharge Condition: Stable CODE STATUS: Full Diet recommendation: Regular  Brief/Interim Summary: 36 y.o. male with medical history significant of ADD, anxiety, hyperlipidemia, Raynaud's phenomenon, seasonal allergies, nephrolithiasis, chondrosarcoma presented with worsening abdominal pain.  On presentation, lipase was normal, AST was 203, ALT of 151, total bili of 12.4. CT abdomen/pelvis showed postsurgical changes of the resection and the previously seen mass in the anterolateral right lung, but there were no acute intra-abdominal or intrapelvic pathology. RUQ US showed a focal 7 mm thickening of the gallbladder that could reflect a fold versus through wall thickening.  GI was consulted.  During the hospitalization, MRCP of abdomen showed no acute findings.  Pain has improved.  Diet has been advanced which she has tolerated.  GI has cleared the patient for discharge.  Discharge patient home today with outpatient follow-up with GI.  LFTs improving.    Discharge Diagnoses:   Abdominal pain Elevated LFTs -Questionable cause.  Presented with severe abdominal pain.  Imaging unremarkable as above. -GI evaluation and follow-up appreciated: LFTs improving.  GI has cleared the patient for discharge.  Patient is tolerating advance diet.  GI recommends to hold Wegovy till reevaluation with GI as an outpatient -Currently on oral Protonix which will be continued on discharge  ADHD -Continue home regimen.  Outpatient follow-up with PCP/psychiatry  Raynaud's phenomena -Continue  amlodipine  Overweight -Outpatient follow-up   Discharge Instructions  Discharge Instructions     Diet general   Complete by: As directed    Increase activity slowly   Complete by: As directed       Allergies as of 04/25/2022   No Known Allergies      Medication List     STOP taking these medications    acetaminophen 500 MG tablet Commonly known as: TYLENOL   Carestart COVID-19 Home Test Kit Generic drug: COVID-19 At Home Antigen [employer] COVID-19 Vac Bivalent injection Generic drug: COVID-19 mRNA bivalent vaccine Therapist, music)   Wegovy 2.4 MG/0.75ML Soaj Generic drug: Semaglutide-Weight Management       TAKE these medications    amLODipine 2.5 MG tablet Commonly known as: NORVASC TAKE 1 TABLET BY MOUTH ONCE DAILY What changed:  how much to take when to take this   Concerta 18 MG CR tablet Generic drug: methylphenidate Take 1 tablet (18 mg total) by mouth daily.   fluticasone 50 MCG/ACT nasal spray Commonly known as: FLONASE Place 1 spray into both nostrils daily as needed for allergies or rhinitis.   ibuprofen 200 MG tablet Commonly known as: ADVIL Take 400 mg by mouth every 6 (six) hours as needed (pain).   meloxicam 15 MG tablet Commonly known as: MOBIC Take 1 tablet (15 mg total) by mouth daily.   niacin 500 MG tablet Commonly known as: VITAMIN B3 Take 500 mg by mouth at bedtime.   Omron 3 Series BP Monitor Devi Use as directed   ondansetron 8 MG disintegrating tablet Commonly known as: ZOFRAN-ODT Take 1 tablet (8 mg total) by mouth every 8 (eight) hours as needed for nausea.   pantoprazole 40 MG tablet Commonly known as:  PROTONIX Take 1 tablet (40 mg total) by mouth at bedtime.   PEPCID PO Take 1 tablet by mouth daily as needed (heartburn).   Trintellix 10 MG Tabs tablet Generic drug: vortioxetine HBr Take 1 tablet (10 mg total) by mouth daily.        Follow-up Information     Silverio Decamp, MD. Schedule an  appointment as soon as possible for a visit in 1 week(s).   Specialties: Family Medicine, Sports Medicine, Radiology Why: With repeat [facility] Contact information: [address] [place] [address] [phone]         Gastroenterology, [place]. Schedule an appointment as soon as possible for a visit in 1 week(s).   Contact information: [address] [place] [address] [phone]                No Known Allergies  Consultations: GI   Procedures/Studies: MR ABDOMEN MRCP W WO CONTAST  Result Date: 04/24/2022 CLINICAL DATA:  Jaundice. Epigastric pain. Abnormal liver function tests. History of chondrosarcoma. EXAM: MRI ABDOMEN WITHOUT AND WITH CONTRAST (INCLUDING MRCP) TECHNIQUE: Multiplanar multisequence MR imaging of the abdomen was performed both before and after the administration of intravenous contrast. Heavily T2-weighted images of the biliary and pancreatic ducts were obtained, and three-dimensional MRCP images were rendered by post processing. CONTRAST:  17m GADAVIST GADOBUTROL 1 MMOL/ML IV SOLN COMPARISON:  CT on 04/24/2022 FINDINGS: Lower Chest: No acute findings. Hepatobiliary: Postop changes again seen involving the right anterolateral chest wall soft tissues and liver dome. No hepatic masses identified. No evidence of steatosis. Gallbladder is unremarkable. No evidence of biliary ductal dilatation or choledocholithiasis. Pancreas: No mass or inflammatory changes. No evidence of pancreatic ductal dilatation or pancreas divisum. Spleen: Within normal limits in size and appearance. Adrenals/Urinary Tract: No masses identified. No evidence of ureteral calculi or hydronephrosis. Stomach/Bowel: No evidence of obstruction, inflammatory process or abnormal fluid collections. Vascular/Lymphatic: No pathologically enlarged lymph nodes. No acute vascular findings. Reproductive:  No mass or other significant abnormality. Other:  None. Musculoskeletal:  No  suspicious bone lesions identified. IMPRESSION: No acute findings. No evidence of hepatobiliary disease or other significant abnormality. Electronically Signed   By: JMarlaine HindM.D.   On: 04/24/2022 21:34   MR 3D Recon At Scanner  Result Date: 04/24/2022 CLINICAL DATA:  Jaundice. Epigastric pain. Abnormal liver function tests. History of chondrosarcoma. EXAM: MRI ABDOMEN WITHOUT AND WITH CONTRAST (INCLUDING MRCP) TECHNIQUE: Multiplanar multisequence MR imaging of the abdomen was performed both before and after the administration of intravenous contrast. Heavily T2-weighted images of the biliary and pancreatic ducts were obtained, and three-dimensional MRCP images were rendered by post processing. CONTRAST:  917mGADAVIST GADOBUTROL 1 MMOL/ML IV SOLN COMPARISON:  CT on 04/24/2022 FINDINGS: Lower Chest: No acute findings. Hepatobiliary: Postop changes again seen involving the right anterolateral chest wall soft tissues and liver dome. No hepatic masses identified. No evidence of steatosis. Gallbladder is unremarkable. No evidence of biliary ductal dilatation or choledocholithiasis. Pancreas: No mass or inflammatory changes. No evidence of pancreatic ductal dilatation or pancreas divisum. Spleen: Within normal limits in size and appearance. Adrenals/Urinary Tract: No masses identified. No evidence of ureteral calculi or hydronephrosis. Stomach/Bowel: No evidence of obstruction, inflammatory process or abnormal fluid collections. Vascular/Lymphatic: No pathologically enlarged lymph nodes. No acute vascular findings. Reproductive:  No mass or other significant abnormality. Other:  None. Musculoskeletal:  No suspicious bone lesions identified. IMPRESSION: No acute findings. No evidence of hepatobiliary disease or other  significant abnormality. Electronically Signed   By: Marlaine Hind M.D.   On: 04/24/2022 21:34   US Abdomen Limited RUQ (LIVER/GB)  Result Date: 04/24/2022 CLINICAL DATA:  Abnormal liver function  tests EXAM: ULTRASOUND ABDOMEN LIMITED RIGHT UPPER QUADRANT COMPARISON:  Same day CT FINDINGS: Gallbladder: There is a focal area of thickening along the gallbladder wall measuring 7 mm without gallbladder wall fluid or edema. Remainder of the gallbladder wall is normal in appearance. Minimal layering sludge within the gallbladder. No gallstones or pericholecystic fluid visualized. No sonographic Murphy sign noted by sonographer. Common bile duct: Diameter: 3 mm. Liver: No focal lesion identified. Mildly increased hepatic parenchymal echogenicity. Portal vein is patent on color Doppler imaging with normal direction of blood flow towards the liver. Other: None. IMPRESSION: 1. Focal area of thickening along the gallbladder wall measuring up to 7 mm without gallbladder wall fluid or edema. It is uncertain if this reflects a fold of the gallbladder wall or true wall thickening remainder of the gallbladder wall is normal in appearance. Gallbladder sludge is present. No shadowing stones. No Murphy sign on exam. 2. The echogenicity of the liver is mildly increased. This is a nonspecific finding but is most commonly seen with fatty infiltration of the liver. There are no obvious focal liver lesions. Electronically Signed   By: Davina Poke D.O.   On: 04/24/2022 10:02   CT Abdomen Pelvis W Contrast  Result Date: 04/24/2022 CLINICAL DATA:  Epigastric pain.  History of chondrosarcoma. EXAM: CT ABDOMEN AND PELVIS WITH CONTRAST TECHNIQUE: Multidetector CT imaging of the abdomen and pelvis was performed using the standard protocol following bolus administration of intravenous contrast. RADIATION DOSE REDUCTION: This exam was performed according to the departmental dose-optimization program which includes automated exposure control, adjustment of the mA and/or kV according to patient size and/or use of iterative reconstruction technique. CONTRAST:  109m OMNIPAQUE IOHEXOL 300 MG/ML  SOLN COMPARISON:  CT abdomen pelvis dated  03/10/2021. FINDINGS: Lower chest: An area of pleural base consolidation with air bronchogram in the anterolateral right middle lobe corresponds to the previously seen mass and likely represents postsurgical scarring related to resection of the previously seen mass. There is mild thickening of the adjacent pleura. The visualized left lung base is clear. No intra-abdominal free air or free fluid. Hepatobiliary: No focal liver abnormality is seen. No gallstones, gallbladder wall thickening, or biliary dilatation. Pancreas: Unremarkable. No pancreatic ductal dilatation or surrounding inflammatory changes. Spleen: Normal in size without focal abnormality. Adrenals/Urinary Tract: Adrenal glands are unremarkable. Kidneys are normal, without renal calculi, focal lesion, or hydronephrosis. Bladder is unremarkable. Stomach/Bowel: There is no bowel obstruction or active inflammation. The appendix is normal. Vascular/Lymphatic: The abdominal aorta and IVC are unremarkable. No portal venous gas. There is no adenopathy. Reproductive: The prostate and seminal vesicles are grossly unremarkable. No pelvic mass. Other: None Musculoskeletal: No acute osseous pathology. Multiple small sclerotic bone lesions involving the pelvic bone and visualized proximal femurs similar to prior CT, likely bone island. IMPRESSION: 1. No acute intra-abdominal or pelvic pathology. No bowel obstruction. Normal appendix. 2. Postsurgical changes of resection of the previously seen mass in the anterolateral right lung. Electronically Signed   By: AAnner CreteM.D.   On: 04/24/2022 02:22      Subjective: Patient seen and examined at bedside.  Denies any current abdominal pain.  Tolerating liquid diet.  Feels okay to go home today.  No overnight fever, chest pain or shortness of breath reported. Discharge Exam: Vitals:  04/24/22 2042 04/25/22 0537  BP: 127/78 117/71  Pulse: 74 76  Resp: 18 18  Temp: 98.9 F (37.2 C) 98.2 F (36.8 C)   SpO2: 98% 99%    General: Pt is alert, awake, not in acute distress.  Currently on room air. Cardiovascular: rate controlled, S1/S2 + Respiratory: bilateral decreased breath sounds at bases Abdominal: Soft, NT, ND, bowel sounds + Extremities: no edema, no cyanosis    The results of significant diagnostics from this hospitalization (including imaging, microbiology, ancillary and laboratory) are listed below for reference.     Microbiology: No results found for this or any previous visit (from the past 240 hour(s)).   Labs: BNP (last 3 results) No results for input(s): "BNP" in the last 8760 hours. Basic Metabolic Panel: Recent Labs  Lab 04/24/22 0043 04/24/22 0640 04/25/22 0425  NA 140 141 138  K 3.8 4.1 3.7  CL 104 104 107  CO2 26 27 23   GLUCOSE 119* 95 88  BUN 13 12 9   CREATININE 1.17 1.07 1.01  CALCIUM 9.1 9.6 8.4*   Liver Function Tests: Recent Labs  Lab 04/24/22 0043 04/24/22 0640 04/25/22 0425  AST 203* 347* 159*  ALT 151* 292* 270*  ALKPHOS 102 107 104  BILITOT 2.4* 2.9* 2.0*  PROT 6.8 6.4* 6.0*  ALBUMIN 4.8 4.5 3.6   Recent Labs  Lab 04/24/22 0043  LIPASE 25   No results for input(s): "AMMONIA" in the last 168 hours. CBC: Recent Labs  Lab 04/24/22 0043 04/24/22 0640 04/25/22 0425  WBC 15.0* 7.2 5.5  NEUTROABS 13.1* 4.7  --   HGB 16.7 15.5 14.8  HCT 47.7 45.2 43.5  MCV 88.0 88.3 89.9  PLT 276 270 233   Cardiac Enzymes: No results for input(s): "CKTOTAL", "CKMB", "CKMBINDEX", "TROPONINI" in the last 168 hours. BNP: Invalid input(s): "POCBNP" CBG: No results for input(s): "GLUCAP" in the last 168 hours. D-Dimer No results for input(s): "DDIMER" in the last 72 hours. Hgb A1c No results for input(s): "HGBA1C" in the last 72 hours. Lipid Profile No results for input(s): "CHOL", "HDL", "LDLCALC", "TRIG", "CHOLHDL", "LDLDIRECT" in the last 72 hours. Thyroid function studies No results for input(s): "TSH", "T4TOTAL", "T3FREE",  "THYROIDAB" in the last 72 hours.  Invalid input(s): "FREET3" Anemia work up No results for input(s): "VITAMINB12", "FOLATE", "FERRITIN", "TIBC", "IRON", "RETICCTPCT" in the last 72 hours. Urinalysis    Component Value Date/Time   COLORURINE ORANGE (A) 03/10/2021 0000   APPEARANCEUR CLOUDY (A) 03/10/2021 0000   LABSPEC 1.020 03/10/2021 0000   PHURINE 6.0 03/10/2021 0000   GLUCOSEU NEGATIVE 03/10/2021 0000   HGBUR 3+ (A) 03/10/2021 0000   KETONESUR NEGATIVE 03/10/2021 0000   PROTEINUR 2+ (A) 03/10/2021 0000   Sepsis Labs Recent Labs  Lab 04/24/22 0043 04/24/22 0640 04/25/22 0425  WBC 15.0* 7.2 5.5   Microbiology No results found for this or any previous visit (from the past 240 hour(s)).   Time coordinating discharge: 35 minutes  SIGNED:   Aline August, MD  [facility] 04/25/2022, 11:09 AM

## 2022-04-25 NOTE — Progress Notes (Signed)
  Transition of Care [facility]) Screening Note   Patient Details  Name: [name] [name] Date of Birth: Apr 18, 1986   Transition of Care [facility]) CM/SW Contact:    Lennart Pall, LCSW Phone Number: 04/25/2022, 10:06 AM    Transition of Care Department [facility]) has reviewed patient and no TOC needs have been identified at this time. We will continue to monitor patient advancement through interdisciplinary progression rounds. If new patient transition needs arise, please place a TOC consult.

## 2022-05-02 ENCOUNTER — Encounter: Payer: Self-pay | Admitting: Sports Medicine

## 2022-05-02 ENCOUNTER — Ambulatory Visit (INDEPENDENT_AMBULATORY_CARE_PROVIDER_SITE_OTHER): Payer: 59 | Admitting: Sports Medicine

## 2022-05-02 VITALS — BP 113/76 | HR 91 | Ht 69.0 in | Wt 206.0 lb

## 2022-05-02 DIAGNOSIS — R109 Unspecified abdominal pain: Secondary | ICD-10-CM

## 2022-05-02 DIAGNOSIS — Z0000 Encounter for general adult medical examination without abnormal findings: Secondary | ICD-10-CM

## 2022-05-02 DIAGNOSIS — E781 Pure hyperglyceridemia: Secondary | ICD-10-CM

## 2022-05-02 NOTE — Assessment & Plan Note (Signed)
Annual physical as above. Checking routine labs today.

## 2022-05-02 NOTE — Progress Notes (Signed)
Subjective:    CC: Annual Physical Exam  HPI:  This patient is here for their annual physical  I reviewed the past medical history, family history, social history, surgical history, and allergies today and no changes were needed.  Please see the problem list section below in epic for further details.  Past Medical History: Past Medical History:  Diagnosis Date   ADD (attention deficit disorder)    Anxiety    Family history of breast cancer    Family history of pancreatic cancer    Family history of prostate cancer    Heart murmur    History of chondrosarcoma    History of kidney stones    Hyperlipidemia    Raynaud disease    Seasonal allergies    Past Surgical History: Past Surgical History:  Procedure Laterality Date   CHEST WALL RECONSTRUCTION N/A 04/06/2021   Procedure: CHEST WALL RESECTION WITH MESH RECONSTRUCTION;  Surgeon: Lajuana Matte, MD;  Location: [facility] OR;  Service: Thoracic;  Laterality: N/A;   INNER EAR SURGERY     Social History: Social History   Socioeconomic History   Marital status: Single    Spouse name: Not on file   Number of children: Not on file   Years of education: Not on file   Highest education level: Not on file  Occupational History   Not on file  Tobacco Use   Smoking status: Never   Smokeless tobacco: Never  Vaping Use   Vaping Use: Never used  Substance and Sexual Activity   Alcohol use: No   Drug use: No   Sexual activity: Never  Other Topics Concern   Not on file  Social History Narrative   Not on file   Social Determinants of Health   Financial Resource Strain: Not on file  Food Insecurity: Not on file  Transportation Needs: Not on file  Physical Activity: Not on file  Stress: Not on file  Social Connections: Not on file   Family History: Family History  Problem Relation Age of Onset   Hypertension Father    Prostate cancer Father        dx late 6s   Melanoma Father        dx 71s, on wrist   Prostate  cancer Paternal Uncle        dx 66s   Melanoma Paternal Uncle    Breast cancer Maternal Grandmother    Pancreatic cancer Maternal Grandmother    Stroke Maternal Grandfather    Hypertension Paternal Grandmother    Other Paternal Grandmother        benign brain tumor   Alzheimer's disease Paternal Grandfather    Prostate cancer Paternal Grandfather    Allergies: No Known Allergies Medications: See med rec.  Review of Systems: No headache, visual changes, nausea, vomiting, diarrhea, constipation, dizziness, abdominal pain, skin rash, fevers, chills, night sweats, swollen lymph nodes, weight loss, chest pain, body aches, joint swelling, muscle aches, shortness of breath, mood changes, visual or auditory hallucinations.  Objective:    General: Well Developed, well nourished, and in no acute distress.  Neuro: Alert and oriented x3, extra-ocular muscles intact, sensation grossly intact. Cranial nerves II through XII are intact, motor, sensory, and coordinative functions are all intact. HEENT: Normocephalic, atraumatic, pupils equal round reactive to light, neck supple, no masses, no lymphadenopathy, thyroid nonpalpable. Oropharynx, nasopharynx, external ear canals are unremarkable. Skin: Warm and dry, no rashes noted.  Cardiac: Regular rate and rhythm, no murmurs rubs or gallops.  Respiratory: Clear to auscultation bilaterally. Not using accessory muscles, speaking in full sentences.  Abdominal: Soft, minimal tenderness right upper quadrant, negative Murphy sign, nondistended, positive bowel sounds, no masses, no organomegaly.  Musculoskeletal: Shoulder, elbow, wrist, hip, knee, ankle stable, and with full range of motion.  Impression and Recommendations:    The patient was counselled, risk factors were discussed, anticipatory guidance given.  Annual physical exam Annual physical as above. Checking routine labs today.  Abdominal pain Injury who was recently hospitalized for acute  abdominal pain, it started out as colicky, midepigastric with radiation to the right shoulder blade, and then evolved to diffuse. In the ED he was noted to have significant transaminitis, mildly elevated bilirubin, alkaline phosphatase was surprisingly normal. In the ED his pain resolved. He was admitted, he had an MRCP, the results of which are in the chart. He also had a CT abdomen and pelvis as well as a right upper quadrant ultrasound. Labs trended downward, that we do need to follow-up on his bilirubin and liver function. He also has a follow-up coming up with GI. I do suspect he passed a gallstone. I think he can restart his Wegovy. He can follow-up with me as needed for this.  We will watch from [place].  ____________________________________________ Gwen Her. Dianah Field, M.D., ABFM., CAQSM., AME. Primary Care and Sports Medicine [facility]  Adjunct Professor of [place] [employer]  Risk manager

## 2022-05-02 NOTE — Assessment & Plan Note (Addendum)
Injury who was recently hospitalized for acute abdominal pain, it started out as colicky, midepigastric with radiation to the right shoulder blade, and then evolved to diffuse. In the ED he was noted to have significant transaminitis, mildly elevated bilirubin, alkaline phosphatase was surprisingly normal. In the ED his pain resolved. He was admitted, he had an MRCP, the results of which are in the chart. He also had a CT abdomen and pelvis as well as a right upper quadrant ultrasound. Labs trended downward, that we do need to follow-up on his bilirubin and liver function. He also has a follow-up coming up with GI. I do suspect he passed a gallstone. I think he can restart his Wegovy. He can follow-up with me as needed for this.  We will watch from [place].

## 2022-05-03 ENCOUNTER — Encounter: Payer: Self-pay | Admitting: Sports Medicine

## 2022-05-09 DIAGNOSIS — R945 Abnormal results of liver function studies: Secondary | ICD-10-CM | POA: Diagnosis not present

## 2022-05-09 LAB — CBC
HCT: 50.3 % — ABNORMAL HIGH (ref 38.5–50.0)
Hemoglobin: 17.2 g/dL — ABNORMAL HIGH (ref 13.2–17.1)
MCH: 30.1 pg (ref 27.0–33.0)
MCHC: 34.2 g/dL (ref 32.0–36.0)
MCV: 87.9 fL (ref 80.0–100.0)
MPV: 10.3 fL (ref 7.5–12.5)
Platelets: 334 10*3/uL (ref 140–400)
RBC: 5.72 10*6/uL (ref 4.20–5.80)
RDW: 12.4 % (ref 11.0–15.0)
WBC: 6.4 10*3/uL (ref 3.8–10.8)

## 2022-05-09 LAB — COMPLETE METABOLIC PANEL WITH GFR
AG Ratio: 2.2 (calc) (ref 1.0–2.5)
ALT: 48 U/L — ABNORMAL HIGH (ref 9–46)
AST: 21 U/L (ref 10–40)
Albumin: 4.8 g/dL (ref 3.6–5.1)
Alkaline phosphatase (APISO): 117 U/L (ref 36–130)
BUN: 11 mg/dL (ref 7–25)
CO2: 25 mmol/L (ref 20–32)
Calcium: 9.6 mg/dL (ref 8.6–10.3)
Chloride: 105 mmol/L (ref 98–110)
Creat: 0.95 mg/dL (ref 0.60–1.26)
Globulin: 2.2 g/dL (calc) (ref 1.9–3.7)
Glucose, Bld: 71 mg/dL (ref 65–139)
Potassium: 4.4 mmol/L (ref 3.5–5.3)
Sodium: 143 mmol/L (ref 135–146)
Total Bilirubin: 0.6 mg/dL (ref 0.2–1.2)
Total Protein: 7 g/dL (ref 6.1–8.1)
eGFR: 107 mL/min/{1.73_m2} (ref >=60)

## 2022-05-09 LAB — LIPASE: Lipase: 29 U/L (ref 7–60)

## 2022-05-09 LAB — HEMOGLOBIN A1C
Hgb A1c MFr Bld: 4.9 % of total Hgb (ref <5.7)
Mean Plasma Glucose: 94 mg/dL
eAG (mmol/L): 5.2 mmol/L

## 2022-05-09 LAB — LIPID PANEL
Cholesterol: 189 mg/dL (ref <200)
HDL: 52 mg/dL (ref >=40)
LDL Cholesterol (Calc): 113 mg/dL (calc) — ABNORMAL HIGH
Non-HDL Cholesterol (Calc): 137 mg/dL (calc) — ABNORMAL HIGH (ref <130)
Total CHOL/HDL Ratio: 3.6 (calc) (ref <5.0)
Triglycerides: 128 mg/dL (ref <150)

## 2022-05-09 LAB — AMYLASE: Amylase: 27 U/L (ref 21–101)

## 2022-05-09 LAB — SPECIMEN COMPROMISED

## 2022-05-09 LAB — TSH: TSH: 0.23 mIU/L — ABNORMAL LOW (ref 0.40–4.50)

## 2022-05-11 ENCOUNTER — Other Ambulatory Visit (HOSPITAL_BASED_OUTPATIENT_CLINIC_OR_DEPARTMENT_OTHER): Payer: Self-pay

## 2022-06-02 DIAGNOSIS — R945 Abnormal results of liver function studies: Secondary | ICD-10-CM | POA: Diagnosis not present

## 2022-06-02 DIAGNOSIS — R1013 Epigastric pain: Secondary | ICD-10-CM | POA: Diagnosis not present

## 2022-06-07 IMAGING — DX DG CHEST 2V
2 series · 2 of 2 positions shown · non-contrast
Comparison: 06/01/2021

CLINICAL DATA: Chest wall mass with resection March 2021.

EXAM:
CHEST - 2 VIEW

[dg chest 2 view (1 of 2)]
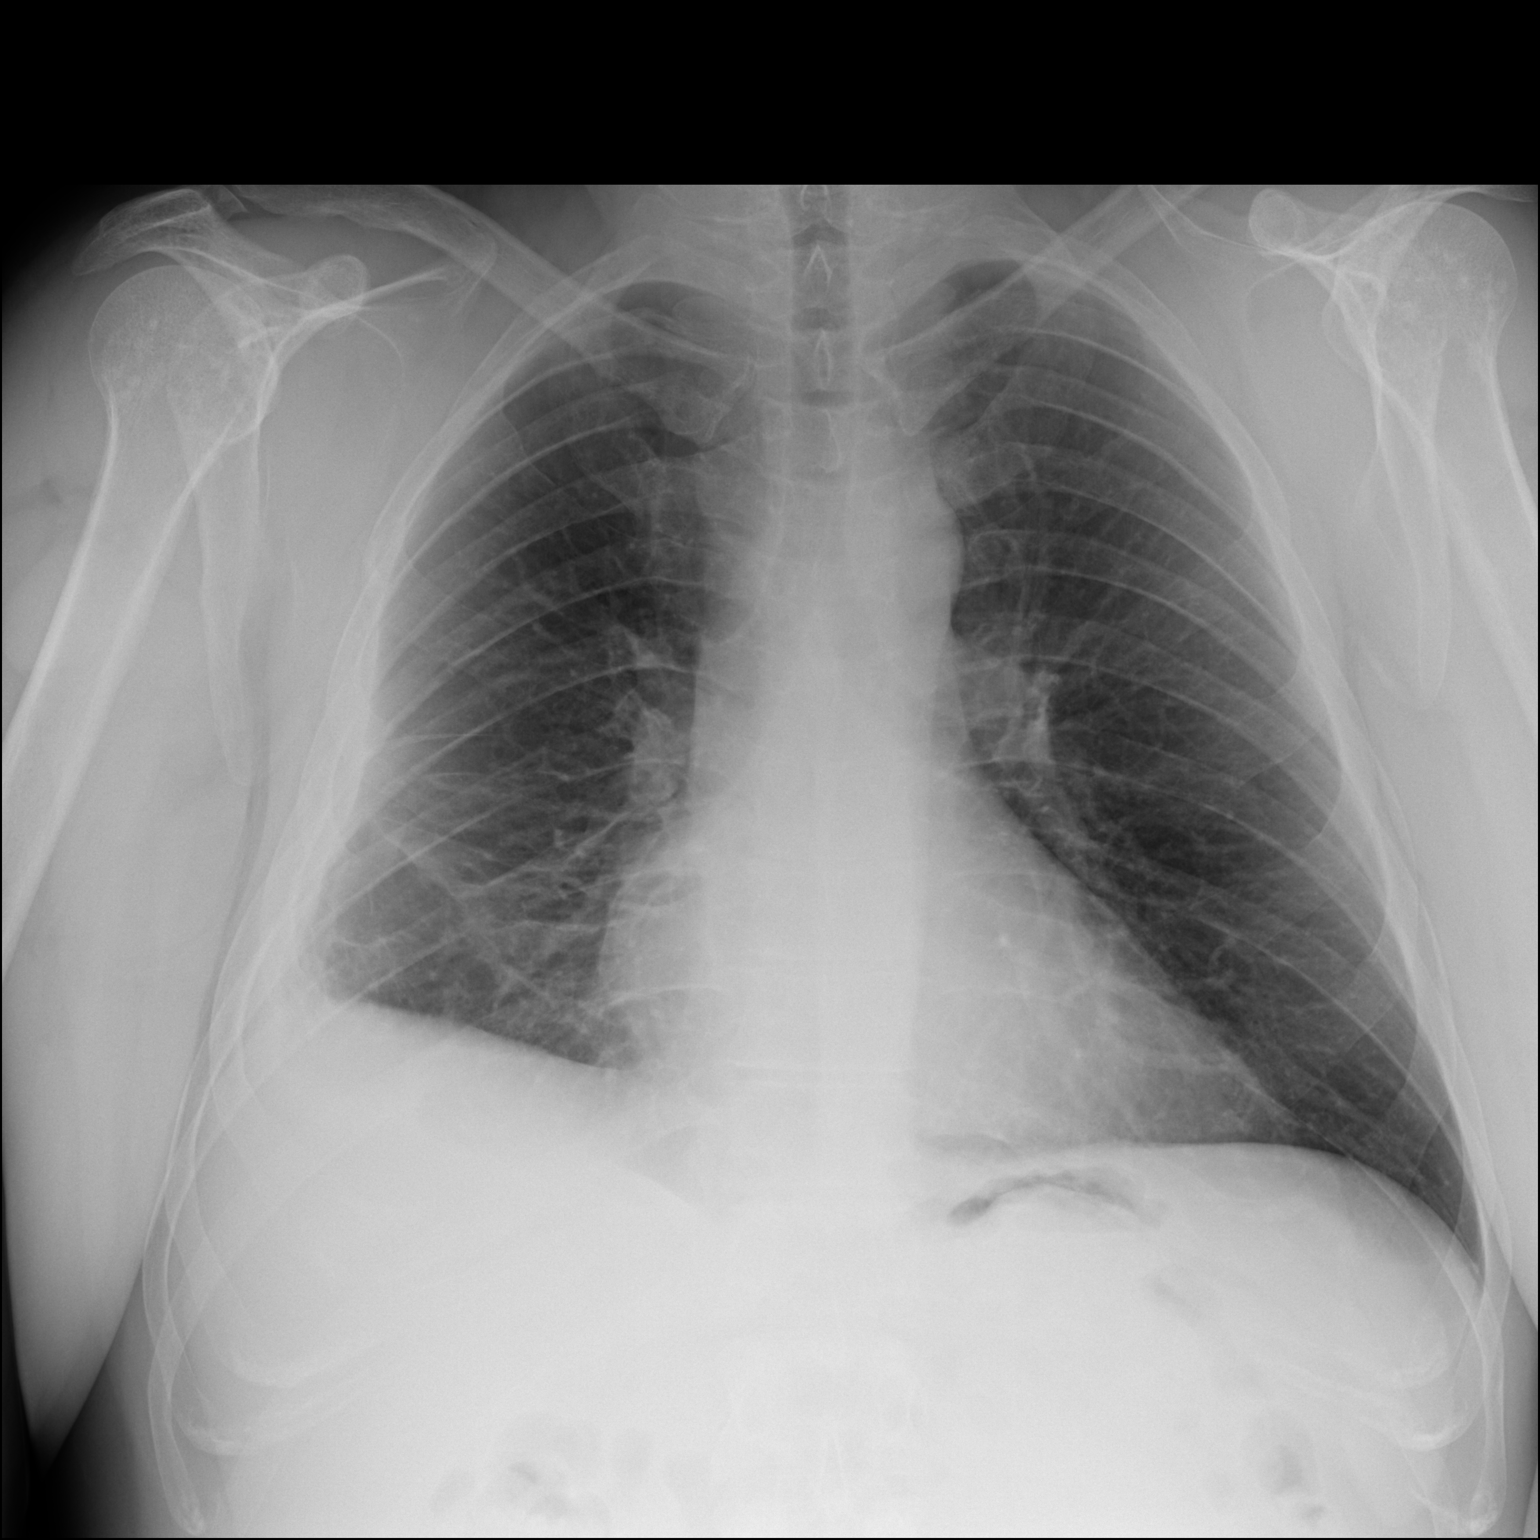

[dg chest 2 view (2 of 2)]
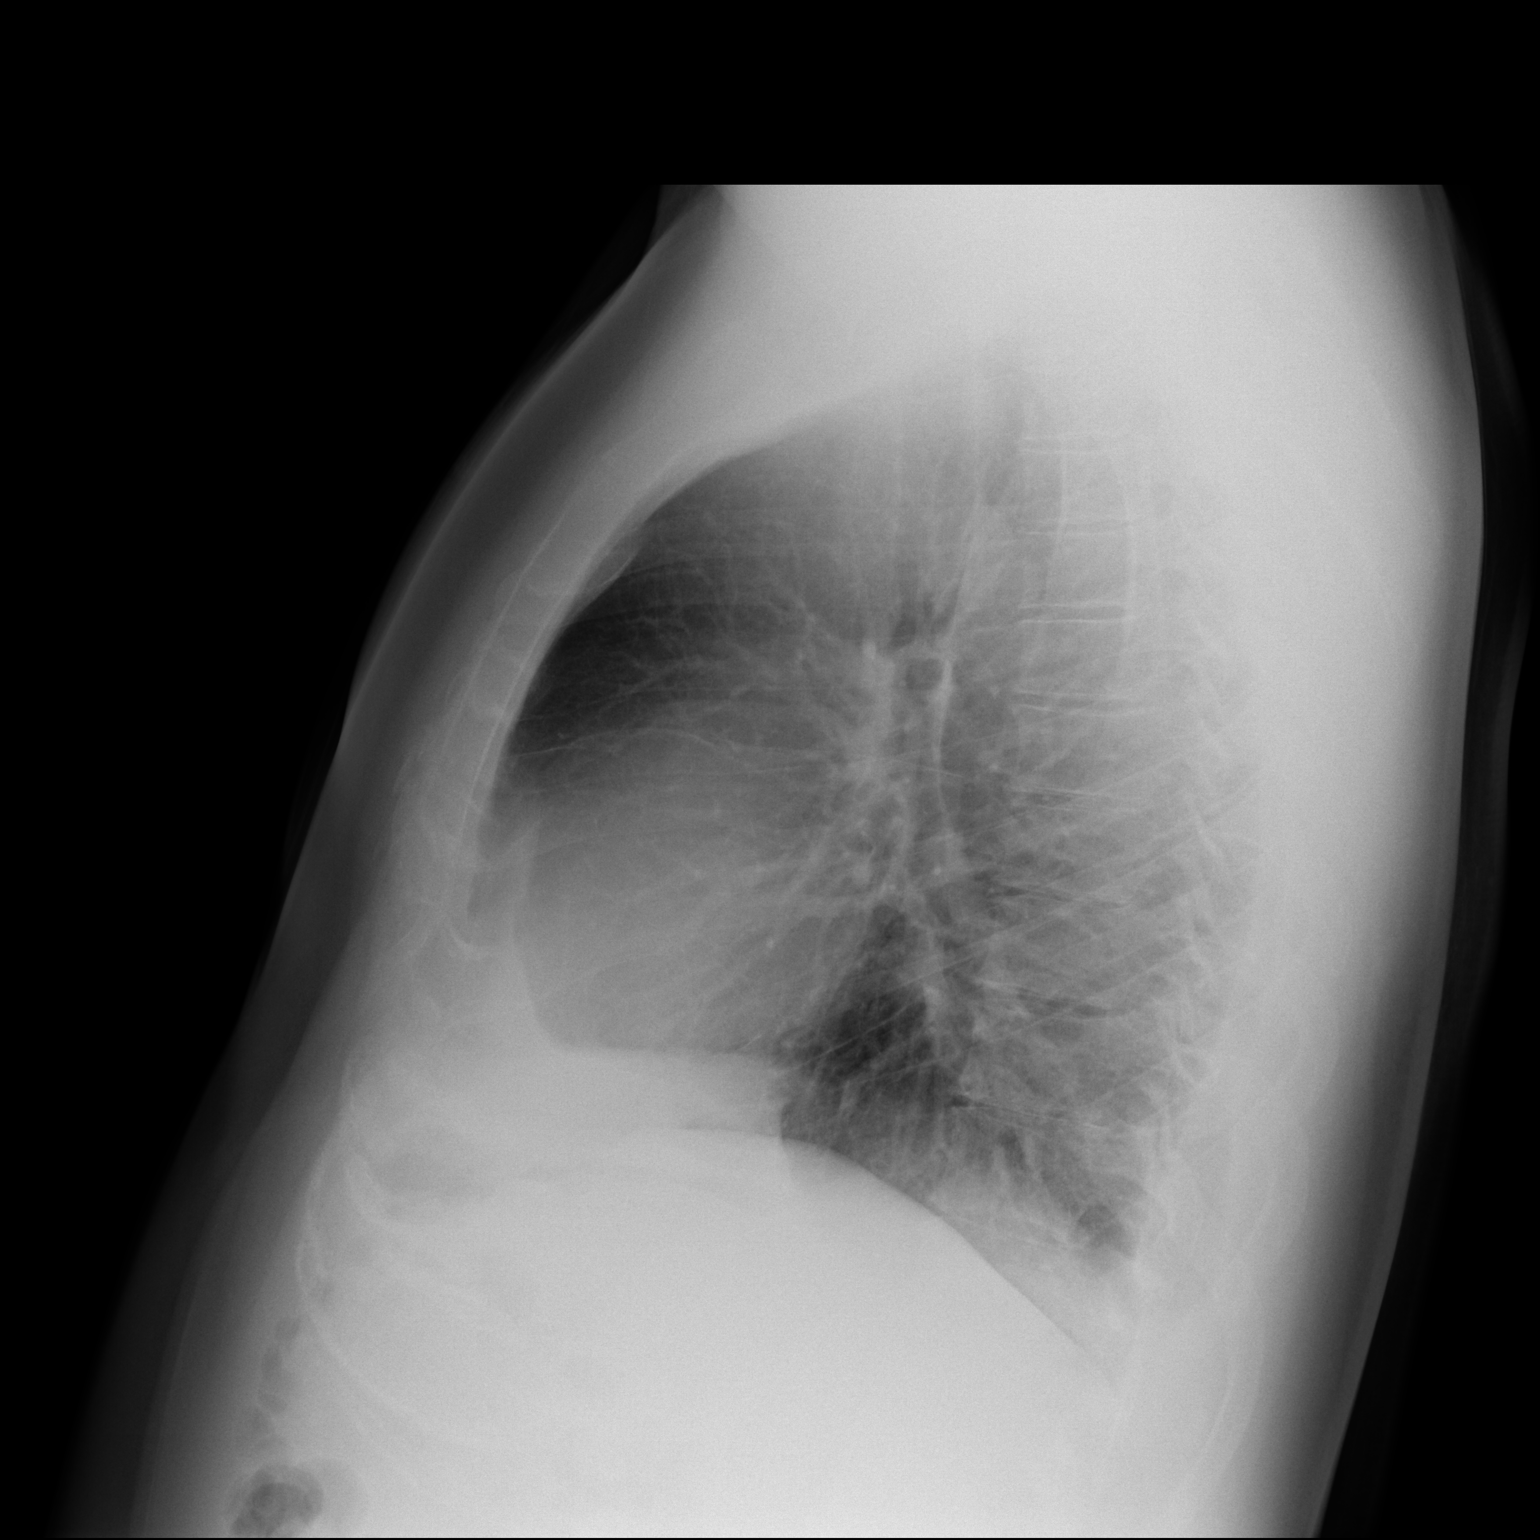

[2 of 2 positions shown; findings below may reference images not displayed]

FINDINGS: Interval improvement in right lower lobe postoperative change.
Improvement in pleural thickening and streaky lung markings in the
right lung base. No significant pleural effusion. No pneumothorax.
Left lung is clear. Negative for heart failure.
IMPRESSION: Improving postop changes right lung base. No superimposed acute
abnormality.

## 2022-06-10 ENCOUNTER — Other Ambulatory Visit: Payer: Self-pay | Admitting: Sports Medicine

## 2022-06-10 ENCOUNTER — Other Ambulatory Visit (HOSPITAL_BASED_OUTPATIENT_CLINIC_OR_DEPARTMENT_OTHER): Payer: Self-pay

## 2022-06-10 DIAGNOSIS — F909 Attention-deficit hyperactivity disorder, unspecified type: Secondary | ICD-10-CM

## 2022-06-10 DIAGNOSIS — M545 Low back pain, unspecified: Secondary | ICD-10-CM

## 2022-06-10 MED ORDER — MELOXICAM 15 MG PO TABS
15.0000 mg | ORAL_TABLET | Freq: Every day | ORAL | 3 refills | Status: DC
  Filled 2022-06-10: qty 90, 90d supply, fill #0
  Filled 2022-09-06: qty 90, 90d supply, fill #1
  Filled 2022-12-12: qty 90, 90d supply, fill #2
  Filled 2023-04-20: qty 90, 90d supply, fill #3

## 2022-06-10 MED ORDER — METHYLPHENIDATE HCL ER (OSM) 18 MG PO TBCR
18.0000 mg | EXTENDED_RELEASE_TABLET | Freq: Every day | ORAL | 0 refills | Status: DC
  Filled 2022-06-10: qty 90, 90d supply, fill #0

## 2022-06-28 IMAGING — CT CT CHEST W/O CM
2 of 4 series · 15 of 36 positions shown, 18 images · non-contrast
Comparison: Chest radiograph 06/25/2021; CT chest 03/11/2021

CLINICAL DATA: Pneumonia, effusion, or abscess suspected. Patient
reports history of tumor removed from the right ribs, currently
undergoing radiation.

EXAM:
CT CHEST WITHOUT CONTRAST
TECHNIQUE: Multidetector CT imaging of the chest was performed following the
standard protocol without IV contrast.

[Series 2: thorax · axial · 0.93mm/px · z∈[-298,-30]mm · 12 of 160 slices shown, 15 images]
[im 13/160  mediastinal]
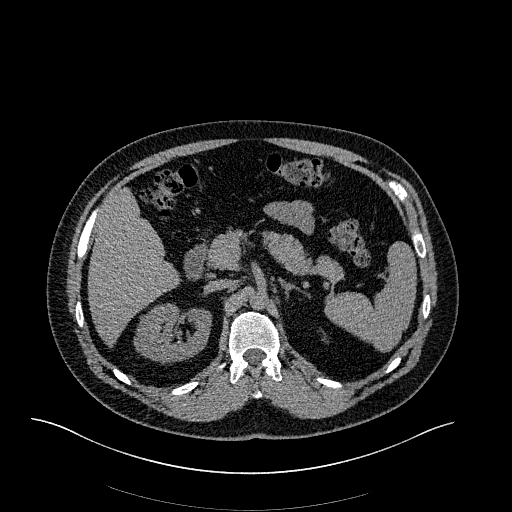
[im 13/160  lung]
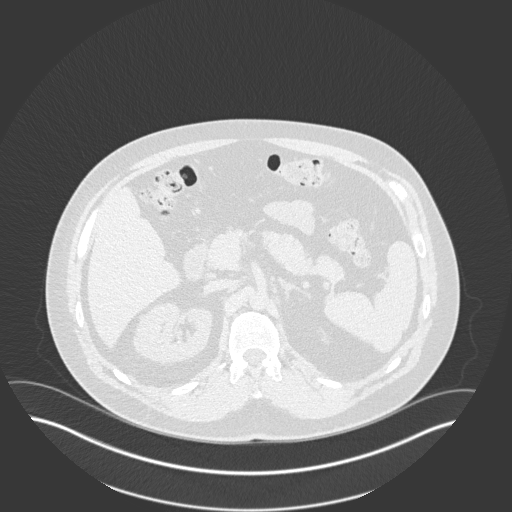
[im 25/160  lung]
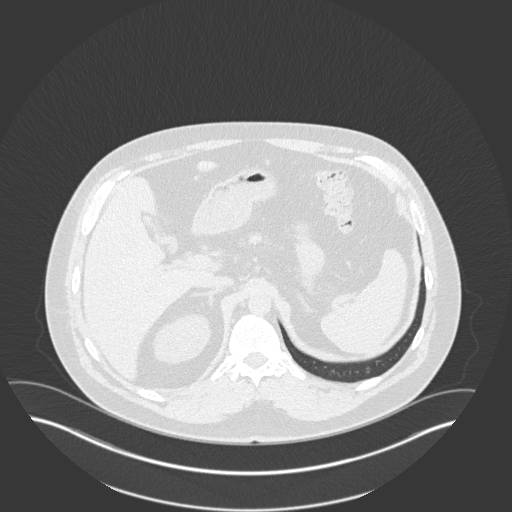
[im 37/160  lung]
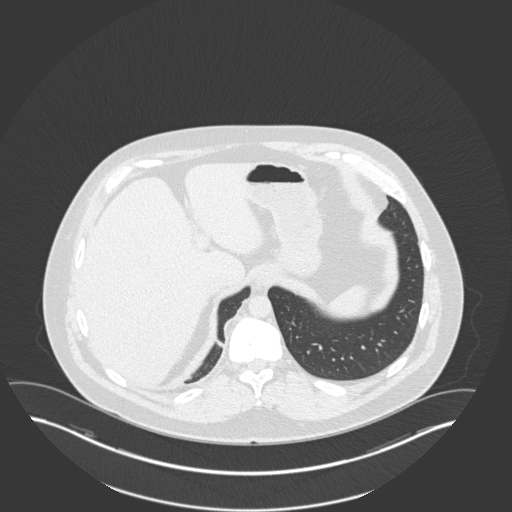
[im 49/160  lung]
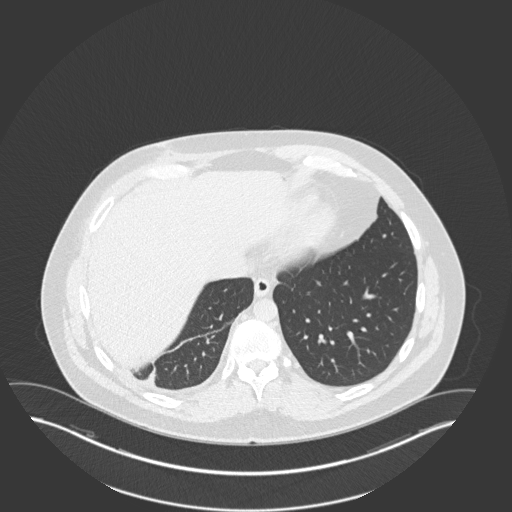
[im 62/160  mediastinal]
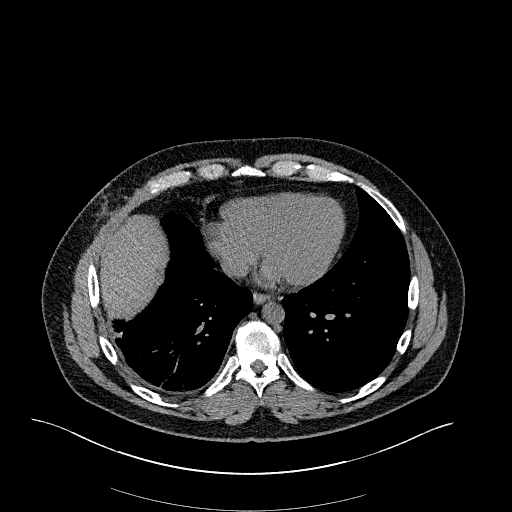
[im 62/160  lung]
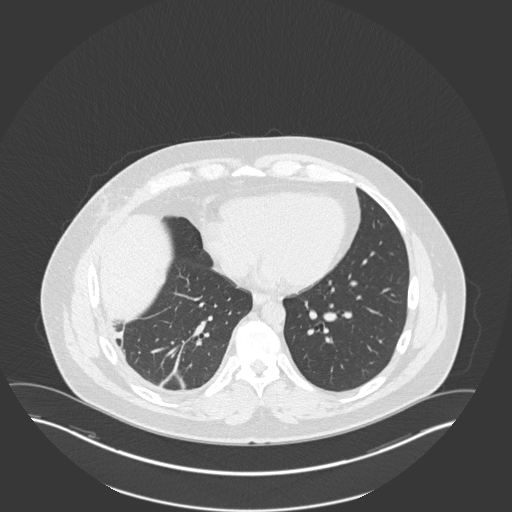
[im 74/160  lung]
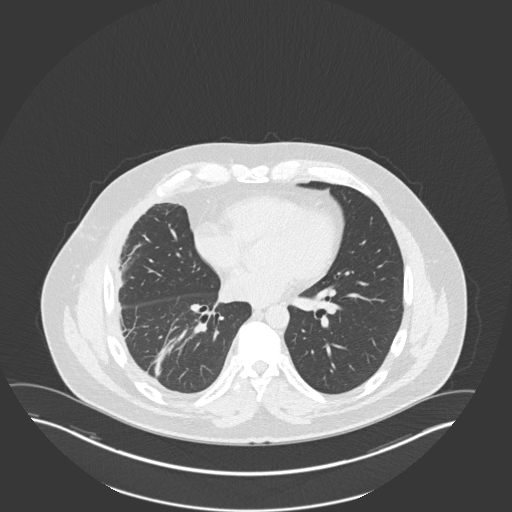
[im 86/160  lung]
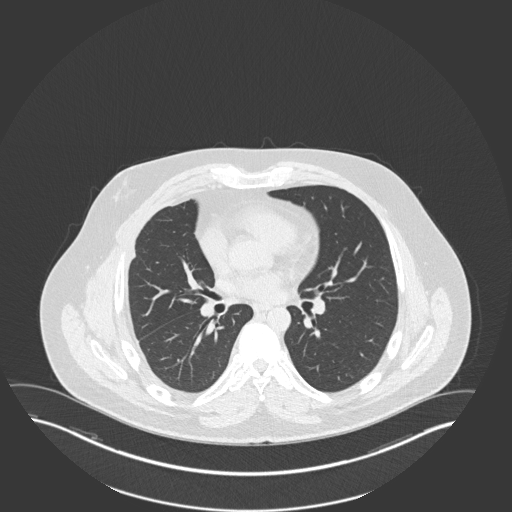
[im 98/160  lung]
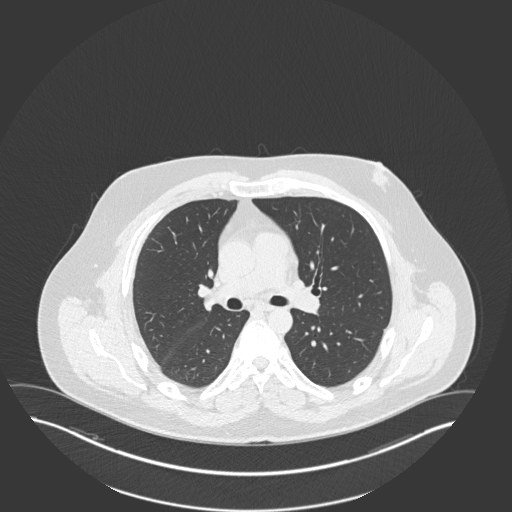
[im 111/160  mediastinal]
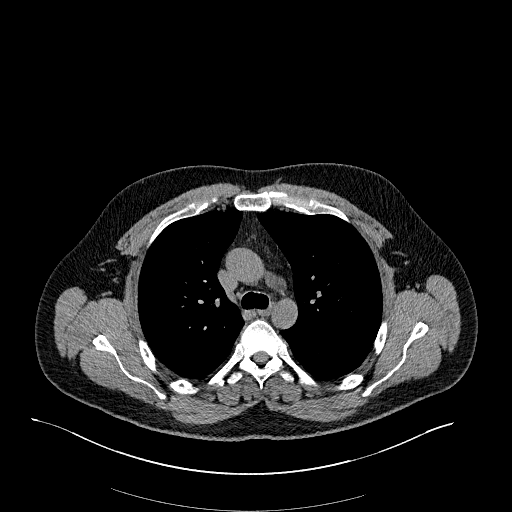
[im 111/160  lung]
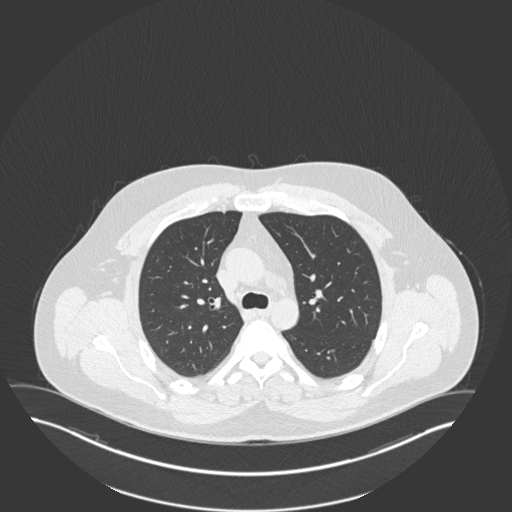
[im 123/160  lung]
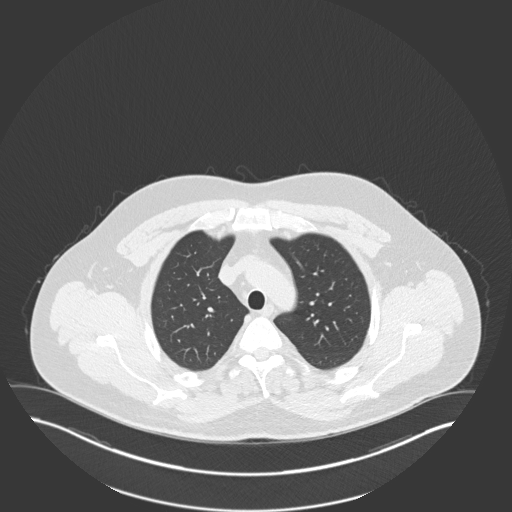
[im 135/160  lung]
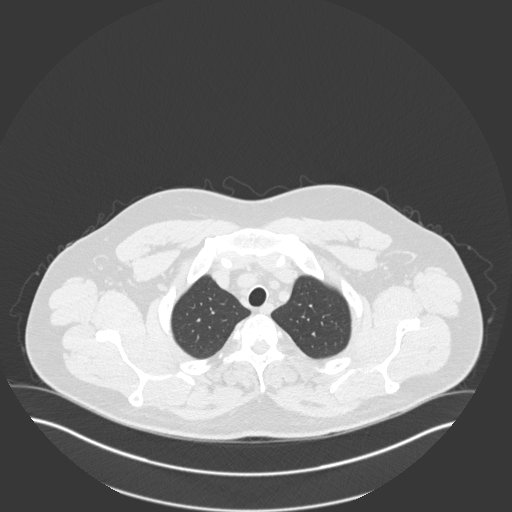
[im 147/160  lung]
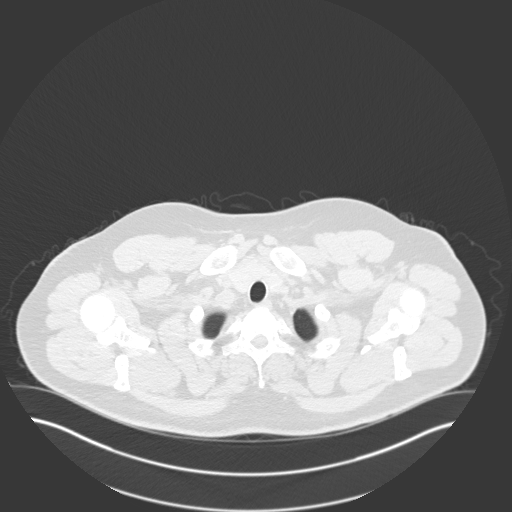

[Series 5: coronal · coronal · 0.63mm/px · 3 of 151 slices shown]
[im 31/151  lung]
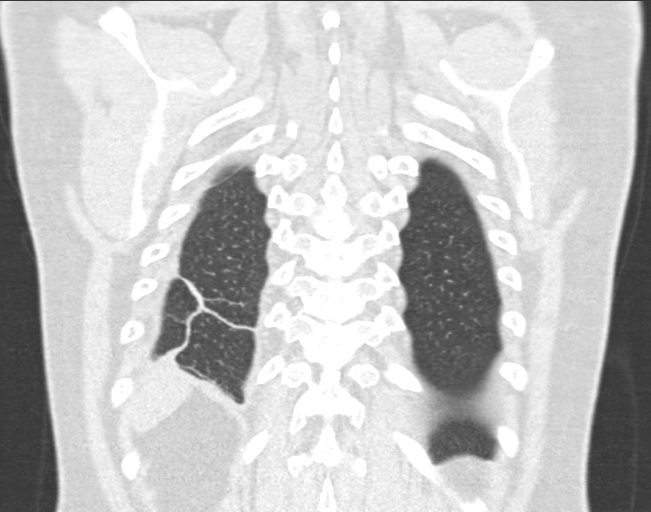
[im 61/151  lung]
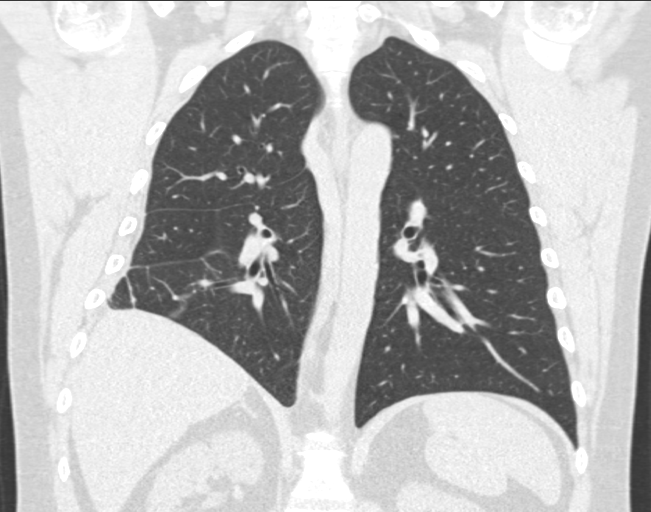
[im 91/151  lung]
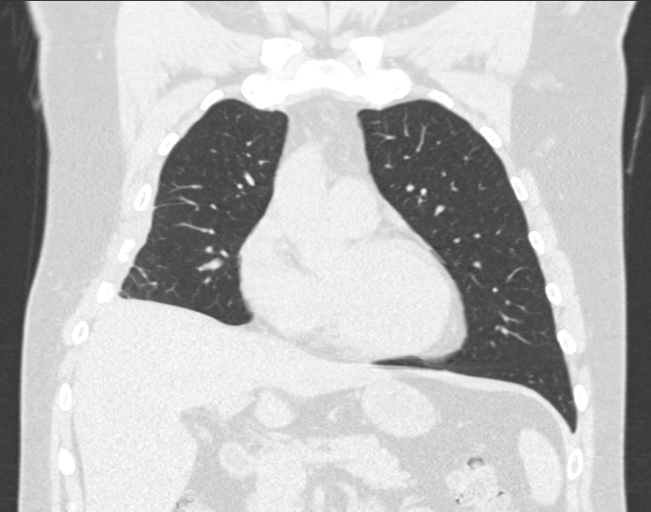

[15 of 36 positions shown; findings below may reference images not displayed]

FINDINGS: Cardiovascular: No significant vascular findings. Normal heart size.
No pericardial effusion.

Mediastinum/Nodes: No enlarged mediastinal or axillary lymph nodes.
Thyroid gland, trachea, and esophagus demonstrate no significant
findings.

Lungs/Pleura: Thin linear atelectasis versus scarring in the lateral
right middle lobe and right lower lobe. Slight pleural thickening
noted underlying the surgical site at the level of the lateral right
sixth rib. No pleural effusion or pneumothorax. The lungs are
otherwise clear.

Upper Abdomen: No acute abnormality. Elevation of the right
hemidiaphragm, similar to radiograph 06/25/2021.

Musculoskeletal: Since 03/11/2021, there is interval resection of
the anterolateral aspects of the right fifth and sixth ribs and
associated mass lesion in the right anterolateral chest wall. Mild
edema in the overlying subcutaneous tissues at the surgical site.
Unchanged numerous tiny sclerotic foci in the bilateral humeral
heads and scapulae. No new suspicious osseous lesion. Bilateral
gynecomastia, left greater than right.
IMPRESSION: 1. Expected postoperative changes of the right anterolateral chest
wall at site of resection involving the right fifth and sixth ribs.
No findings of abscess as queried.
2. Thin linear scarring versus atelectasis noted in the right middle
and lower lobes. The lungs are otherwise clear. No pleural effusion.

## 2022-07-21 ENCOUNTER — Other Ambulatory Visit (HOSPITAL_BASED_OUTPATIENT_CLINIC_OR_DEPARTMENT_OTHER): Payer: Self-pay

## 2022-07-28 ENCOUNTER — Encounter: Payer: Self-pay | Admitting: Licensed Clinical Social Worker

## 2022-07-28 NOTE — Progress Notes (Signed)
UPDATE: VUS in PALB2 called [id number] ([id number]) has been reclassified to "Likely Benign." The amended report date is 07/27/2022. 

## 2022-08-02 ENCOUNTER — Ambulatory Visit (INDEPENDENT_AMBULATORY_CARE_PROVIDER_SITE_OTHER): Payer: 59

## 2022-08-02 ENCOUNTER — Other Ambulatory Visit (HOSPITAL_BASED_OUTPATIENT_CLINIC_OR_DEPARTMENT_OTHER): Payer: Self-pay

## 2022-08-02 ENCOUNTER — Ambulatory Visit (INDEPENDENT_AMBULATORY_CARE_PROVIDER_SITE_OTHER): Payer: 59 | Admitting: Sports Medicine

## 2022-08-02 ENCOUNTER — Encounter: Payer: Self-pay | Admitting: Sports Medicine

## 2022-08-02 DIAGNOSIS — R0789 Other chest pain: Secondary | ICD-10-CM

## 2022-08-02 DIAGNOSIS — R7989 Other specified abnormal findings of blood chemistry: Secondary | ICD-10-CM | POA: Diagnosis not present

## 2022-08-02 DIAGNOSIS — M2559 Pain in other specified joint: Secondary | ICD-10-CM

## 2022-08-02 DIAGNOSIS — R918 Other nonspecific abnormal finding of lung field: Secondary | ICD-10-CM | POA: Diagnosis not present

## 2022-08-02 DIAGNOSIS — R1011 Right upper quadrant pain: Secondary | ICD-10-CM

## 2022-08-02 MED ORDER — PREDNISONE 50 MG PO TABS
50.0000 mg | ORAL_TABLET | Freq: Every day | ORAL | 0 refills | Status: AC
  Filled 2022-08-02: qty 5, 5d supply, fill #0

## 2022-08-02 NOTE — Progress Notes (Addendum)
    Procedures performed today:    None.  Independent interpretation of notes and tests performed by another provider:   None.  Brief History, Exam, Impression, and Recommendations:    Right upper quadrant pain Increasing right upper quadrant chest wall pain localized at the right costal margin. 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 If insufficient improvement after around 2 weeks we need a CT of his chest with contrast. Biliary colic and nephrolithiasis are also in the differential so we will check these with his labs as well.  Update: There does appear to be increasing fullness right lower lung fields, I did discuss this with radiology.  The differential is broad including progressive scarring postradiation, pleural effusion, as well as recurrence of the pre-existing chondrosarcoma, though I think recurrence is remote. To be certain we will get a CT with contrast for further delineation, otherwise no change in plan.  Update 2: CT shows no findings suspicious for recurrent tumor, the findings on the x-ray were just postoperative changes, postradiation changes all consistent with scarring.  There were however multiple calcified gallstones.  As biliary colic was also in the differential I do think we should go ahead and proceed with referral to general surgery.    ____________________________________________ Debby PARAS. Curtis, M.D., ABFM., CAQSM., AME. Primary Care and Sports Medicine [facility]  Adjunct Professor of [facility] Medicine  [employer]  [employer], [employer]

## 2022-08-02 NOTE — Assessment & Plan Note (Signed)
Increasing right upper quadrant chest wall pain localized at the right costal margin. 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 If insufficient improvement after around 2 weeks we need a CT of his chest with contrast. Biliary colic and nephrolithiasis are also in the differential so we will check these with his labs as well.  Update: There does appear to be increasing fullness right lower lung fields, I did discuss this with radiology.  The differential is broad including progressive scarring postradiation, pleural effusion, as well as recurrence of the pre-existing chondrosarcoma, though I think recurrence is remote. To be certain we will get a CT with contrast for further delineation, otherwise no change in plan.  Update 2: CT shows no findings suspicious for recurrent tumor, the findings on the x-ray were just postoperative changes, postradiation changes all consistent with scarring.  There were however multiple calcified gallstones.  As biliary colic was also in the differential I do think we should go ahead and proceed with referral to general surgery.

## 2022-08-03 ENCOUNTER — Ambulatory Visit (INDEPENDENT_AMBULATORY_CARE_PROVIDER_SITE_OTHER): Payer: 59

## 2022-08-03 ENCOUNTER — Encounter: Payer: Self-pay | Admitting: Sports Medicine

## 2022-08-03 DIAGNOSIS — J929 Pleural plaque without asbestos: Secondary | ICD-10-CM | POA: Diagnosis not present

## 2022-08-03 DIAGNOSIS — R0789 Other chest pain: Secondary | ICD-10-CM | POA: Diagnosis not present

## 2022-08-03 DIAGNOSIS — C413 Malignant neoplasm of ribs, sternum and clavicle: Secondary | ICD-10-CM | POA: Diagnosis not present

## 2022-08-03 DIAGNOSIS — R918 Other nonspecific abnormal finding of lung field: Secondary | ICD-10-CM | POA: Diagnosis not present

## 2022-08-03 DIAGNOSIS — J8410 Pulmonary fibrosis, unspecified: Secondary | ICD-10-CM | POA: Diagnosis not present

## 2022-08-03 LAB — COMPLETE METABOLIC PANEL WITH GFR
AG Ratio: 2.1 (calc) (ref 1.0–2.5)
ALT: 32 U/L (ref 9–46)
AST: 22 U/L (ref 10–40)
Albumin: 4.9 g/dL (ref 3.6–5.1)
Alkaline phosphatase (APISO): 110 U/L (ref 36–130)
BUN: 12 mg/dL (ref 7–25)
CO2: 30 mmol/L (ref 20–32)
Calcium: 9.6 mg/dL (ref 8.6–10.3)
Chloride: 103 mmol/L (ref 98–110)
Creat: 1.04 mg/dL (ref 0.60–1.26)
Globulin: 2.3 g/dL (calc) (ref 1.9–3.7)
Glucose, Bld: 92 mg/dL (ref 65–99)
Potassium: 4.6 mmol/L (ref 3.5–5.3)
Sodium: 140 mmol/L (ref 135–146)
Total Bilirubin: 0.4 mg/dL (ref 0.2–1.2)
Total Protein: 7.2 g/dL (ref 6.1–8.1)
eGFR: 96 mL/min/{1.73_m2} (ref >=60)

## 2022-08-03 LAB — URINALYSIS W MICROSCOPIC + REFLEX CULTURE
Bacteria, UA: NONE SEEN /HPF
Bilirubin Urine: NEGATIVE
Glucose, UA: NEGATIVE
Hgb urine dipstick: NEGATIVE
Hyaline Cast: NONE SEEN /LPF
Ketones, ur: NEGATIVE
Leukocyte Esterase: NEGATIVE
Nitrites, Initial: NEGATIVE
Protein, ur: NEGATIVE
RBC / HPF: NONE SEEN /HPF (ref 0–2)
Specific Gravity, Urine: 1.022 (ref 1.001–1.035)
Squamous Epithelial / HPF: NONE SEEN /HPF (ref <=5)
WBC, UA: NONE SEEN /HPF (ref 0–5)
pH: 7 (ref 5.0–8.0)

## 2022-08-03 LAB — CBC WITH DIFFERENTIAL/PLATELET
Absolute Monocytes: 740 cells/uL (ref 200–950)
Basophils Absolute: 70 cells/uL (ref 0–200)
Basophils Relative: 0.8 %
Eosinophils Absolute: 157 cells/uL (ref 15–500)
Eosinophils Relative: 1.8 %
HCT: 50.9 % — ABNORMAL HIGH (ref 38.5–50.0)
Hemoglobin: 17.3 g/dL — ABNORMAL HIGH (ref 13.2–17.1)
Lymphs Abs: 2192 cells/uL (ref 850–3900)
MCH: 30.8 pg (ref 27.0–33.0)
MCHC: 34 g/dL (ref 32.0–36.0)
MCV: 90.7 fL (ref 80.0–100.0)
MPV: 10.3 fL (ref 7.5–12.5)
Monocytes Relative: 8.5 %
Neutro Abs: 5542 cells/uL (ref 1500–7800)
Neutrophils Relative %: 63.7 %
Platelets: 312 10*3/uL (ref 140–400)
RBC: 5.61 10*6/uL (ref 4.20–5.80)
RDW: 12.1 % (ref 11.0–15.0)
Total Lymphocyte: 25.2 %
WBC: 8.7 10*3/uL (ref 3.8–10.8)

## 2022-08-03 LAB — D-DIMER, QUANTITATIVE: D-Dimer, Quant: 0.25 mcg/mL FEU (ref <0.50)

## 2022-08-03 LAB — TSH: TSH: 0.67 mIU/L (ref 0.40–4.50)

## 2022-08-03 LAB — NO CULTURE INDICATED

## 2022-08-03 LAB — LIPASE: Lipase: 23 U/L (ref 7–60)

## 2022-08-03 LAB — T4, FREE: Free T4: 1.2 ng/dL (ref 0.8–1.8)

## 2022-08-03 LAB — T3, FREE: T3, Free: 4.1 pg/mL (ref 2.3–4.2)

## 2022-08-03 MED ORDER — IOHEXOL 300 MG/ML  SOLN
100.0000 mL | Freq: Once | INTRAMUSCULAR | Status: AC | PRN
  Administered 2022-08-03: 75 mL via INTRAVENOUS

## 2022-08-03 NOTE — Addendum Note (Signed)
Addended by: Silverio Decamp on: 08/03/2022 12:02 PM   Modules accepted: Orders

## 2022-08-04 NOTE — Addendum Note (Signed)
Addended by: Silverio Decamp on: 08/04/2022 08:45 AM   Modules accepted: Orders

## 2022-08-16 ENCOUNTER — Ambulatory Visit: Payer: 59 | Admitting: Sports Medicine

## 2022-08-22 ENCOUNTER — Other Ambulatory Visit (HOSPITAL_BASED_OUTPATIENT_CLINIC_OR_DEPARTMENT_OTHER): Payer: Self-pay

## 2022-09-01 DIAGNOSIS — Z9889 Other specified postprocedural states: Secondary | ICD-10-CM | POA: Diagnosis not present

## 2022-09-01 DIAGNOSIS — K8020 Calculus of gallbladder without cholecystitis without obstruction: Secondary | ICD-10-CM | POA: Diagnosis not present

## 2022-09-02 ENCOUNTER — Ambulatory Visit: Payer: Self-pay | Admitting: General Surgery

## 2022-09-06 ENCOUNTER — Other Ambulatory Visit: Payer: Self-pay

## 2022-09-06 ENCOUNTER — Other Ambulatory Visit (HOSPITAL_BASED_OUTPATIENT_CLINIC_OR_DEPARTMENT_OTHER): Payer: Self-pay

## 2022-09-06 ENCOUNTER — Other Ambulatory Visit: Payer: Self-pay | Admitting: Sports Medicine

## 2022-09-06 DIAGNOSIS — F909 Attention-deficit hyperactivity disorder, unspecified type: Secondary | ICD-10-CM

## 2022-09-06 MED ORDER — METHYLPHENIDATE HCL ER (OSM) 18 MG PO TBCR
18.0000 mg | EXTENDED_RELEASE_TABLET | Freq: Every day | ORAL | 0 refills | Status: DC
  Filled 2022-09-06: qty 90, 90d supply, fill #0

## 2022-09-06 NOTE — Patient Instructions (Signed)
SURGICAL WAITING ROOM VISITATION Patients having surgery or a procedure may have no more than 2 support people in the waiting area - these visitors may rotate in the visitor waiting room.   Children under the age of 46 must have an adult with them who is not the patient. If the patient needs to stay at the hospital during part of their recovery, the visitor guidelines for inpatient rooms apply.  PRE-OP VISITATION  Pre-op nurse will coordinate an appropriate time for 1 support person to accompany the patient in pre-op.  This support person may not rotate.  This visitor will be contacted when the time is appropriate for the visitor to come back in the pre-op area.  Please refer to the [facility] website for the visitor guidelines for Inpatients (after your surgery is over and you are in a regular room).  You are not required to quarantine at this time prior to your surgery. However, you must do this: Hand Hygiene often Do NOT share personal items Notify your provider if you are in close contact with someone who has COVID or you develop fever 100.4 or greater, new onset of sneezing, cough, sore throat, shortness of breath or body aches.  If you test positive for Covid or have been in contact with anyone that has tested positive in the last 10 days please notify you surgeon.    Your procedure is scheduled on:  Monday  September 12, 2022  Report to [facility] Main Entrance: [place] entrance where the [employer] is available.   Report to admitting at: 10:15 AM  +++++Call this number if you have any questions or problems the morning of surgery [phone]  Do not eat food after Midnight the night prior to your surgery/procedure.  After Midnight you may have the following liquids until   09:30 AM DAY OF SURGERY  Clear Liquid Diet Water Black Coffee (sugar ok, NO MILK/CREAM OR CREAMERS)  Tea (sugar ok, NO MILK/CREAM OR CREAMERS) regular and decaf                              Plain Jell-O  with no fruit (NO RED)                                           Fruit ices (not with fruit pulp, NO RED)                                     Popsicles (NO RED)                                                                  Juice: apple, WHITE grape, WHITE cranberry Sports drinks like Gatorade or Powerade (NO RED)                    The day of surgery:  Drink ONE (1) Pre-Surgery Clear Ensure at  09:30 AM the morning of surgery. Drink in one sitting. Do not sip.  This drink was given to you  during your hospital pre-op appointment visit. Nothing else to drink after completing the Pre-Surgery Clear Ensure : No candy, chewing gum or throat lozenges.    FOLLOW ANY ADDITIONAL PRE OP INSTRUCTIONS YOU RECEIVED FROM YOUR SURGEON'S OFFICE!!!   Oral Hygiene is also important to reduce your risk of infection.        Remember - BRUSH YOUR TEETH THE MORNING OF SURGERY WITH YOUR REGULAR TOOTHPASTE  Take ONLY these medicines the morning of surgery with A SIP OF WATER: amlodipine, Trintellix                    You may not have any metal on your body including  jewelry, and body piercing  Do not wear lotions, powders, cologne, or deodorant  Men may shave face and neck.  Patients discharged on the day of surgery will not be allowed to drive home.  Someone NEEDS to stay with you for the first 24 hours after anesthesia.  Do not bring your home medications to the hospital. The Pharmacy will dispense medications listed on your medication list to you during your admission in the Hospital.  Please read over the following fact sheets you were given: IF YOU HAVE QUESTIONS ABOUT YOUR PRE-OP INSTRUCTIONS, PLEASE CALL [phone]  ([place])   [place] - Preparing for Surgery Before surgery, you can play an important role.  Because skin is not sterile, your skin needs to be as free of germs as possible.  You can reduce the number of germs on your skin by washing with CHG (chlorahexidine  gluconate) soap before surgery.  CHG is an antiseptic cleaner which kills germs and bonds with the skin to continue killing germs even after washing. Please DO NOT use if you have an allergy to CHG or antibacterial soaps.  If your skin becomes reddened/irritated stop using the CHG and inform your nurse when you arrive at Short Stay. Do not shave (including legs and underarms) for at least 48 hours prior to the first CHG shower.  You may shave your face/neck.  Please follow these instructions carefully:  1.  Shower with CHG Soap the night before surgery and the  morning of surgery.  2.  If you choose to wash your hair, wash your hair first as usual with your normal  shampoo.  3.  After you shampoo, rinse your hair and body thoroughly to remove the shampoo.                             4.  Use CHG as you would any other liquid soap.  You can apply chg directly to the skin and wash.  Gently with a scrungie or clean washcloth.  5.  Apply the CHG Soap to your body ONLY FROM THE NECK DOWN.   Do not use on face/ open                           Wound or open sores. Avoid contact with eyes, ears mouth and genitals (private parts).                       Wash face,  Genitals (private parts) with your normal soap.             6.  Wash thoroughly, paying special attention to the area where your  surgery  will be performed.  7.  Thoroughly rinse  your body with warm water from the neck down.  8.  DO NOT shower/wash with your normal soap after using and rinsing off the CHG Soap.            9.  Pat yourself dry with a clean towel.            10.  Wear clean pajamas.            11.  Place clean sheets on your bed the night of your first shower and do not  sleep with pets.  ON THE DAY OF SURGERY : Do not apply any lotions/deodorants the morning of surgery.  Please wear clean clothes to the hospital/surgery center.    FAILURE TO FOLLOW THESE INSTRUCTIONS MAY RESULT IN THE CANCELLATION OF YOUR SURGERY  PATIENT  SIGNATURE_________________________________  NURSE SIGNATURE__________________________________  ________________________________________________________________________

## 2022-09-06 NOTE — Progress Notes (Signed)
COVID Vaccine received:  _0  No _1  Yes Date of any COVID positive Test in last 90 days:  PCP - Aundria Mems, MD Cardiologist - none  Chest x-ray - 08-03-2022 CT Chest - 08-03-2022 EKG - 04-02-2021  Stress Test -  ECHO - 05-25-2016 Cardiac Cath -   PCR screen: _2  Ordered & Completed                      _3   No Order but Needs PROFEND                      _4   N/A for this surgery  Surgery Plan:  _5  Ambulatory                            _6  Outpatient in bed                            _7  Admit  Anesthesia:    _8  General  _9  Spinal                           _10   Choice _11   MAC  Pacemaker / ICD device _12  No _13  Yes        Device order form faxed _14  No    _15   Yes      Faxed to:  Spinal Cord Stimulator:_16  No _17  Yes      (Remind patient to bring remote DOS) Other Implants:   History of Sleep Apnea? _18  No _19  Yes   CPAP used?- _20  No _21  Yes    Does the patient monitor blood sugar? _22  No _23  Yes  _24  N/A  Blood Thinner / Instructions:none Aspirin Instructions:  ERAS Protocol Ordered: _25  No  _26  Yes PRE-SURGERY _27  ENSURE  _28  G2  Patient is to be NPO after: 09:30 am  Comments:   Activity level: Patient can / can not climb a flight of stairs without difficulty; _29  No CP  _30  No SOB, but would have ______   Patient can / can not perform ADLs without assistance.   Anesthesia review: S/p chondrosarcoma (chest wall resection w/ mesh by Dr. Lowella Dell 04-06-2021) Hx Kawasaki's disease, ADHD, Raynaud's,  Patient denies shortness of breath, fever, cough and chest pain at PAT appointment.  Patient verbalized understanding and agreement to the Pre-Surgical Instructions that were given to them at this PAT appointment. Patient was also educated of the need to review these PAT instructions again prior to his/her surgery.I reviewed the appropriate phone numbers to call if they have any and questions or concerns.

## 2022-09-06 NOTE — Progress Notes (Signed)
Sent message, via epic in basket, requesting orders in epic from surgeon.  

## 2022-09-08 ENCOUNTER — Other Ambulatory Visit: Payer: Self-pay

## 2022-09-08 ENCOUNTER — Encounter (HOSPITAL_COMMUNITY): Payer: Self-pay

## 2022-09-08 ENCOUNTER — Encounter (HOSPITAL_COMMUNITY)
Admission: RE | Admit: 2022-09-08 | Discharge: 2022-09-08 | Disposition: A | Discharge status: Home / Self Care | Payer: 59 | Source: Ambulatory Visit | Attending: General Surgery | Admitting: General Surgery

## 2022-09-08 VITALS — BP 140/86 | HR 94 | Temp 98.9°F | Resp 22 | Ht 69.0 in | Wt 238.0 lb

## 2022-09-08 DIAGNOSIS — I10 Essential (primary) hypertension: Secondary | ICD-10-CM | POA: Insufficient documentation

## 2022-09-08 DIAGNOSIS — Z01818 Encounter for other preprocedural examination: Secondary | ICD-10-CM | POA: Insufficient documentation

## 2022-09-08 HISTORY — DX: Other specified postprocedural states: Z98.890

## 2022-09-08 HISTORY — DX: Nonrheumatic mitral (valve) prolapse: I34.1

## 2022-09-08 HISTORY — DX: Gastro-esophageal reflux disease without esophagitis: K21.9

## 2022-09-08 LAB — BASIC METABOLIC PANEL
Anion gap: 6 (ref 5–15)
BUN: 11 mg/dL (ref 6–20)
CO2: 28 mmol/L (ref 22–32)
Calcium: 9 mg/dL (ref 8.9–10.3)
Chloride: 108 mmol/L (ref 98–111)
Creatinine, Ser: 1.04 mg/dL (ref 0.61–1.24)
GFR, Estimated: >60 mL/min (ref >60)
Glucose, Bld: 103 mg/dL — ABNORMAL HIGH (ref 70–99)
Potassium: 4 mmol/L (ref 3.5–5.1)
Sodium: 142 mmol/L (ref 135–145)

## 2022-09-08 LAB — CBC
HCT: 49.4 % (ref 39.0–52.0)
Hemoglobin: 16.6 g/dL (ref 13.0–17.0)
MCH: 30.2 pg (ref 26.0–34.0)
MCHC: 33.6 g/dL (ref 30.0–36.0)
MCV: 90 fL (ref 80.0–100.0)
Platelets: 312 10*3/uL (ref 150–400)
RBC: 5.49 MIL/uL (ref 4.22–5.81)
RDW: 11.9 % (ref 11.5–15.5)
WBC: 7 10*3/uL (ref 4.0–10.5)
nRBC: 0 % (ref 0.0–0.2)

## 2022-09-12 ENCOUNTER — Encounter (HOSPITAL_COMMUNITY)
Admission: RE | Disposition: A | Discharge status: Home / Self Care | Payer: Self-pay | Source: Home / Self Care | Attending: General Surgery

## 2022-09-12 ENCOUNTER — Ambulatory Visit (HOSPITAL_BASED_OUTPATIENT_CLINIC_OR_DEPARTMENT_OTHER): Payer: 59 | Admitting: Anesthesiology

## 2022-09-12 ENCOUNTER — Other Ambulatory Visit (HOSPITAL_BASED_OUTPATIENT_CLINIC_OR_DEPARTMENT_OTHER): Payer: Self-pay

## 2022-09-12 ENCOUNTER — Ambulatory Visit (HOSPITAL_COMMUNITY): Payer: 59 | Admitting: Physician Assistant

## 2022-09-12 ENCOUNTER — Other Ambulatory Visit: Payer: Self-pay

## 2022-09-12 ENCOUNTER — Encounter (HOSPITAL_COMMUNITY): Payer: Self-pay | Admitting: General Surgery

## 2022-09-12 ENCOUNTER — Ambulatory Visit (HOSPITAL_COMMUNITY)
Admission: RE | Admit: 2022-09-12 | Discharge: 2022-09-12 | Disposition: A | Discharge status: Home / Self Care | Payer: 59 | Attending: General Surgery | Admitting: General Surgery

## 2022-09-12 DIAGNOSIS — K219 Gastro-esophageal reflux disease without esophagitis: Secondary | ICD-10-CM | POA: Insufficient documentation

## 2022-09-12 DIAGNOSIS — K8020 Calculus of gallbladder without cholecystitis without obstruction: Secondary | ICD-10-CM

## 2022-09-12 DIAGNOSIS — F419 Anxiety disorder, unspecified: Secondary | ICD-10-CM | POA: Insufficient documentation

## 2022-09-12 DIAGNOSIS — K828 Other specified diseases of gallbladder: Secondary | ICD-10-CM | POA: Diagnosis not present

## 2022-09-12 DIAGNOSIS — K8010 Calculus of gallbladder with chronic cholecystitis without obstruction: Secondary | ICD-10-CM | POA: Diagnosis not present

## 2022-09-12 HISTORY — PX: CHOLECYSTECTOMY: SHX55

## 2022-09-12 SURGERY — LAPAROSCOPIC CHOLECYSTECTOMY
Anesthesia: General | Site: Abdomen

## 2022-09-12 MED ORDER — LACTATED RINGERS IR SOLN
Status: DC | PRN
  Administered 2022-09-12: 1000 mL

## 2022-09-12 MED ORDER — FENTANYL CITRATE (PF) 100 MCG/2ML IJ SOLN
INTRAMUSCULAR | Status: DC | PRN
  Administered 2022-09-12: 50 ug via INTRAVENOUS
  Administered 2022-09-12: 25 ug via INTRAVENOUS
  Administered 2022-09-12 (×2): 50 ug via INTRAVENOUS
  Administered 2022-09-12: 25 ug via INTRAVENOUS
  Administered 2022-09-12 (×3): 50 ug via INTRAVENOUS

## 2022-09-12 MED ORDER — DEXAMETHASONE SODIUM PHOSPHATE 10 MG/ML IJ SOLN
INTRAMUSCULAR | Status: DC | PRN
  Administered 2022-09-12: 10 mg via INTRAVENOUS

## 2022-09-12 MED ORDER — FENTANYL CITRATE PF 50 MCG/ML IJ SOSY
25.0000 ug | PREFILLED_SYRINGE | INTRAMUSCULAR | Status: DC | PRN
  Administered 2022-09-12 (×3): 50 ug via INTRAVENOUS

## 2022-09-12 MED ORDER — ONDANSETRON HCL 4 MG/2ML IJ SOLN
INTRAMUSCULAR | Status: DC | PRN
  Administered 2022-09-12: 4 mg via INTRAVENOUS

## 2022-09-12 MED ORDER — BUPIVACAINE-EPINEPHRINE 0.5% -1:200000 IJ SOLN
INTRAMUSCULAR | Status: DC | PRN
  Administered 2022-09-12: 30 mL

## 2022-09-12 MED ORDER — ACETAMINOPHEN 500 MG PO TABS
1000.0000 mg | ORAL_TABLET | ORAL | Status: AC
  Administered 2022-09-12: 1000 mg via ORAL
  Filled 2022-09-12: qty 2

## 2022-09-12 MED ORDER — ORAL CARE MOUTH RINSE: 15.0000 mL | Freq: Once | OROMUCOSAL | Status: AC

## 2022-09-12 MED ORDER — INDOCYANINE GREEN 25 MG IV SOLR
INTRAVENOUS | Status: DC | PRN
  Administered 2022-09-12: 5 mg via INTRAVENOUS

## 2022-09-12 MED ORDER — LACTATED RINGERS IV SOLN: INTRAVENOUS | Status: DC

## 2022-09-12 MED ORDER — 0.9 % SODIUM CHLORIDE (POUR BTL) OPTIME
TOPICAL | Status: DC | PRN
  Administered 2022-09-12: 1000 mL

## 2022-09-12 MED ORDER — MIDAZOLAM HCL 2 MG/2ML IJ SOLN
INTRAMUSCULAR | Status: AC
  Filled 2022-09-12: qty 2

## 2022-09-12 MED ORDER — ACETAMINOPHEN 500 MG PO TABS: 1000.0000 mg | ORAL_TABLET | Freq: Three times a day (TID) | ORAL | 0 refills | Status: AC

## 2022-09-12 MED ORDER — KETOROLAC TROMETHAMINE 30 MG/ML IJ SOLN
INTRAMUSCULAR | Status: AC
  Filled 2022-09-12: qty 1

## 2022-09-12 MED ORDER — FENTANYL CITRATE (PF) 100 MCG/2ML IJ SOLN
INTRAMUSCULAR | Status: AC
  Filled 2022-09-12: qty 2

## 2022-09-12 MED ORDER — ROCURONIUM BROMIDE 10 MG/ML (PF) SYRINGE
PREFILLED_SYRINGE | INTRAVENOUS | Status: AC
  Filled 2022-09-12: qty 10

## 2022-09-12 MED ORDER — KETOROLAC TROMETHAMINE 30 MG/ML IJ SOLN
INTRAMUSCULAR | Status: DC | PRN
  Administered 2022-09-12: 30 mg via INTRAVENOUS

## 2022-09-12 MED ORDER — ROCURONIUM BROMIDE 10 MG/ML (PF) SYRINGE
PREFILLED_SYRINGE | INTRAVENOUS | Status: DC | PRN
  Administered 2022-09-12: 100 mg via INTRAVENOUS
  Administered 2022-09-12: 20 mg via INTRAVENOUS

## 2022-09-12 MED ORDER — LIDOCAINE 2% (20 MG/ML) 5 ML SYRINGE
INTRAMUSCULAR | Status: DC | PRN
  Administered 2022-09-12: 100 mg via INTRAVENOUS

## 2022-09-12 MED ORDER — CHLORHEXIDINE GLUCONATE 0.12 % MT SOLN
15.0000 mL | Freq: Once | OROMUCOSAL | Status: AC
  Administered 2022-09-12: 15 mL via OROMUCOSAL

## 2022-09-12 MED ORDER — HEMOSTATIC AGENTS (NO CHARGE) OPTIME
TOPICAL | Status: DC | PRN
  Administered 2022-09-12: 1 via TOPICAL

## 2022-09-12 MED ORDER — SUGAMMADEX SODIUM 200 MG/2ML IV SOLN
INTRAVENOUS | Status: DC | PRN
  Administered 2022-09-12: 250 mg via INTRAVENOUS

## 2022-09-12 MED ORDER — PROPOFOL 10 MG/ML IV BOLUS
INTRAVENOUS | Status: DC | PRN
  Administered 2022-09-12: 150 mg via INTRAVENOUS

## 2022-09-12 MED ORDER — SODIUM CHLORIDE 0.9 % IV SOLN
2.0000 g | INTRAVENOUS | Status: AC
  Administered 2022-09-12: 2 g via INTRAVENOUS
  Filled 2022-09-12: qty 2

## 2022-09-12 MED ORDER — ONDANSETRON HCL 4 MG/2ML IJ SOLN
INTRAMUSCULAR | Status: AC
  Filled 2022-09-12: qty 2

## 2022-09-12 MED ORDER — CHLORHEXIDINE GLUCONATE CLOTH 2 % EX PADS: 6.0000 | MEDICATED_PAD | Freq: Once | CUTANEOUS | Status: DC

## 2022-09-12 MED ORDER — BUPIVACAINE-EPINEPHRINE (PF) 0.5% -1:200000 IJ SOLN
INTRAMUSCULAR | Status: AC
  Filled 2022-09-12: qty 30

## 2022-09-12 MED ORDER — ACETAMINOPHEN 500 MG PO TABS: 1000.0000 mg | ORAL_TABLET | Freq: Once | ORAL | Status: DC

## 2022-09-12 MED ORDER — EPHEDRINE 5 MG/ML INJ
INTRAVENOUS | Status: AC
  Filled 2022-09-12: qty 5

## 2022-09-12 MED ORDER — MIDAZOLAM HCL 5 MG/5ML IJ SOLN
INTRAMUSCULAR | Status: DC | PRN
  Administered 2022-09-12: 2 mg via INTRAVENOUS

## 2022-09-12 MED ORDER — ONDANSETRON HCL 4 MG/2ML IJ SOLN
4.0000 mg | Freq: Once | INTRAMUSCULAR | Status: AC | PRN
  Administered 2022-09-12: 4 mg via INTRAVENOUS

## 2022-09-12 MED ORDER — DEXMEDETOMIDINE HCL IN NACL 80 MCG/20ML IV SOLN
INTRAVENOUS | Status: DC | PRN
  Administered 2022-09-12 (×3): 4 ug via BUCCAL
  Administered 2022-09-12: 8 ug via BUCCAL

## 2022-09-12 MED ORDER — FENTANYL CITRATE PF 50 MCG/ML IJ SOSY
PREFILLED_SYRINGE | INTRAMUSCULAR | Status: AC
  Filled 2022-09-12: qty 3

## 2022-09-12 MED ORDER — OXYCODONE HCL 5 MG PO TABS
5.0000 mg | ORAL_TABLET | Freq: Four times a day (QID) | ORAL | 0 refills | Status: AC | PRN
  Filled 2022-09-12: qty 15, 4d supply, fill #0

## 2022-09-12 MED ORDER — ENSURE PRE-SURGERY PO LIQD
296.0000 mL | Freq: Once | ORAL | Status: DC
  Filled 2022-09-12: qty 296

## 2022-09-12 SURGICAL SUPPLY — 48 items
APPLICATOR ARISTA FLEXITIP XL (MISCELLANEOUS) IMPLANT
APPLIER CLIP 5 13 M/L LIGAMAX5 (MISCELLANEOUS) ×1
APPLIER CLIP ROT 10 11.4 M/L (STAPLE)
BAG COUNTER SPONGE SURGICOUNT (BAG) IMPLANT
CABLE HIGH FREQUENCY MONO STRZ (ELECTRODE) ×1 IMPLANT
CHLORAPREP W/TINT 26 (MISCELLANEOUS) ×1 IMPLANT
CLIP APPLIE 5 13 M/L LIGAMAX5 (MISCELLANEOUS) IMPLANT
CLIP APPLIE ROT 10 11.4 M/L (STAPLE) IMPLANT
CLIP LIGATING HEMO O LOK GREEN (MISCELLANEOUS) IMPLANT
COVER MAYO STAND XLG (MISCELLANEOUS) IMPLANT
COVER SURGICAL LIGHT HANDLE (MISCELLANEOUS) ×1 IMPLANT
DERMABOND ADVANCED .7 DNX12 (GAUZE/BANDAGES/DRESSINGS) IMPLANT
DRAPE C-ARM 42X120 X-RAY (DRAPES) IMPLANT
DRSG TEGADERM 2-3/8X2-3/4 SM (GAUZE/BANDAGES/DRESSINGS) ×3 IMPLANT
DRSG TEGADERM 4X4.75 (GAUZE/BANDAGES/DRESSINGS) ×1 IMPLANT
ELECT REM PT RETURN 15FT ADLT (MISCELLANEOUS) ×1 IMPLANT
GAUZE SPONGE 2X2 8PLY STRL LF (GAUZE/BANDAGES/DRESSINGS) ×1 IMPLANT
GLOVE BIO SURGEON STRL SZ7.5 (GLOVE) ×1 IMPLANT
GLOVE INDICATOR 8.0 STRL GRN (GLOVE) ×1 IMPLANT
GOWN STRL REUS W/ TWL XL LVL3 (GOWN DISPOSABLE) ×1 IMPLANT
GOWN STRL REUS W/TWL XL LVL3 (GOWN DISPOSABLE) ×1
GRASPER SUT TROCAR 14GX15 (MISCELLANEOUS) IMPLANT
HEMOSTAT ARISTA ABSORB 3G PWDR (HEMOSTASIS) IMPLANT
HEMOSTAT SNOW SURGICEL 2X4 (HEMOSTASIS) IMPLANT
IRRIG SUCT STRYKERFLOW 2 WTIP (MISCELLANEOUS) ×1
IRRIGATION SUCT STRKRFLW 2 WTP (MISCELLANEOUS) ×1 IMPLANT
KIT BASIN OR (CUSTOM PROCEDURE TRAY) ×1 IMPLANT
KIT TURNOVER KIT A (KITS) IMPLANT
L-HOOK LAP DISP 36CM (ELECTROSURGICAL)
LHOOK LAP DISP 36CM (ELECTROSURGICAL) IMPLANT
POUCH RETRIEVAL ECOSAC 10 (ENDOMECHANICALS) ×1 IMPLANT
POUCH RETRIEVAL ECOSAC 10MM (ENDOMECHANICALS) ×1
SCISSORS LAP 5X35 DISP (ENDOMECHANICALS) ×1 IMPLANT
SET CHOLANGIOGRAPH MIX (MISCELLANEOUS) IMPLANT
SET TUBE SMOKE EVAC HIGH FLOW (TUBING) ×1 IMPLANT
SLEEVE Z-THREAD 5X100MM (TROCAR) ×2 IMPLANT
SPIKE FLUID TRANSFER (MISCELLANEOUS) ×1 IMPLANT
STRIP CLOSURE SKIN 1/2X4 (GAUZE/BANDAGES/DRESSINGS) ×1 IMPLANT
SUT MNCRL AB 4-0 PS2 18 (SUTURE) ×1 IMPLANT
SUT VIC AB 0 UR5 27 (SUTURE) IMPLANT
SUT VICRYL 0 TIES 12 18 (SUTURE) IMPLANT
SUT VICRYL 0 UR6 27IN ABS (SUTURE) IMPLANT
TOWEL OR 17X26 10 PK STRL BLUE (TOWEL DISPOSABLE) ×1 IMPLANT
TOWEL OR NON WOVEN STRL DISP B (DISPOSABLE) ×1 IMPLANT
TRAY LAPAROSCOPIC (CUSTOM PROCEDURE TRAY) ×1 IMPLANT
TROCAR 11X100 Z THREAD (TROCAR) IMPLANT
TROCAR BALLN 12MMX100 BLUNT (TROCAR) ×1 IMPLANT
TROCAR Z-THREAD OPTICAL 5X100M (TROCAR) ×1 IMPLANT

## 2022-09-12 NOTE — Discharge Instructions (Signed)
[facility] LAPAROSCOPIC SURGERY: POST OP INSTRUCTIONS Always review your discharge instruction sheet given to you by the facility where your surgery was performed. IF YOU HAVE DISABILITY OR FAMILY LEAVE FORMS, YOU MUST BRING THEM TO THE OFFICE FOR PROCESSING.   DO NOT GIVE THEM TO YOUR DOCTOR.  PAIN CONTROL  First take acetaminophen (Tylenol) AND/or ibuprofen (Advil) to control your pain after surgery.  Follow directions on package.  Taking acetaminophen (Tylenol) and/or ibuprofen (Advil) regularly after surgery will help to control your pain and lower the amount of prescription pain medication you may need.  You should not take more than 3,000 mg (3 grams) of acetaminophen (Tylenol) in 24 hours.  You should not take ibuprofen (Advil), aleve, motrin, naprosyn or other NSAIDS if you have a history of stomach ulcers or chronic kidney disease.  A prescription for pain medication may be given to you upon discharge.  Take your pain medication as prescribed, if you still have uncontrolled pain after taking acetaminophen (Tylenol) or ibuprofen (Advil). Use ice packs to help control pain. If you need a refill on your pain medication, please contact your pharmacy.  They will contact our office to request authorization. Prescriptions will not be filled after 5pm or on week-ends.  HOME MEDICATIONS Take your usually prescribed medications unless otherwise directed.  DIET You should follow a light diet the first few days after arrival home.  Be sure to include lots of fluids daily. Avoid fatty, fried foods.   CONSTIPATION It is common to experience some constipation after surgery and if you are taking pain medication.  Increasing fluid intake and taking a stool softener (such as Colace) will usually help or prevent this problem from occurring.  A mild laxative (Milk of Magnesia or Miralax) should be taken according to package instructions if there are no bowel movements after 48  hours.  WOUND/INCISION CARE Most patients will experience some swelling and bruising in the area of the incisions.  Ice packs will help.  Swelling and bruising can take several days to resolve.  Unless discharge instructions indicate otherwise, follow guidelines below  STERI-STRIPS - you may remove your outer bandages 48 hours after surgery, and you may shower at that time.  You have steri-strips (small skin tapes) in place directly over the incision.  These strips should be left on the skin for 7-10 days.   DERMABOND/SKIN GLUE - you may shower in 24 hours.  The glue will flake off over the next 2-3 weeks. Any sutures or staples will be removed at the office during your follow-up visit.  ACTIVITIES You may resume regular (light) daily activities beginning the next day--such as daily self-care, walking, climbing stairs--gradually increasing activities as tolerated.  You may have sexual intercourse when it is comfortable.  Refrain from any heavy lifting or straining until approved by your doctor. You may drive when you are no longer taking prescription pain medication, you can comfortably wear a seatbelt, and you can safely maneuver your car and apply brakes.  FOLLOW-UP You should see your doctor in the office for a follow-up appointment approximately 2-3 weeks after your surgery.  You should have been given your post-op/follow-up appointment when your surgery was scheduled.  If you did not receive a post-op/follow-up appointment, make sure that you call for this appointment within a day or two after you arrive home to insure a convenient appointment time.  OTHER INSTRUCTIONS   WHEN TO CALL YOUR DOCTOR: Fever over 101.0 Inability to urinate Continued   bleeding from incision. Increased pain, redness, or drainage from the incision. Increasing abdominal pain  The clinic staff is available to answer your questions during regular business hours.  Please don't hesitate to call and ask to speak to  one of the nurses for clinical concerns.  If you have a medical emergency, go to the nearest emergency room or call 911.  A surgeon from [facility] is always on call at the hospital. [address], [address], [place], [place]  [address] ? [address] [address], [place], [place]   [address] [phone] ? [phone] ? FAX [phone] Web site: [email or web address]  

## 2022-09-12 NOTE — Anesthesia Preprocedure Evaluation (Addendum)
Anesthesia Evaluation  Patient identified by MRN, date of birth, ID band Patient awake    Reviewed: Allergy & Precautions, NPO status , Patient's Chart, lab work & pertinent test results  History of Anesthesia Complications (+) PONV and history of anesthetic complications  Airway Mallampati: IV  TM Distance: >3 FB Neck ROM: Full  Mouth opening: Limited Mouth Opening  Dental no notable dental hx. (+) Teeth Intact, Dental Advisory Given   Pulmonary neg pulmonary ROS   Pulmonary exam normal breath sounds clear to auscultation       Cardiovascular Normal cardiovascular exam Rhythm:Regular Rate:Normal  TTE 2017 - Left ventricle: The cavity size was normal. Systolic function was    normal. The estimated ejection fraction was in the range of 50%    to 55%. Wall motion was normal; there were no regional wall    motion abnormalities. Left ventricular diastolic function    parameters were normal.  - Aortic valve: There was no regurgitation.  - Mitral valve: Transvalvular velocity was within the normal range.    There was no evidence for stenosis. There was no regurgitation.  - Right ventricle: The cavity size was normal. Wall thickness was    normal. Systolic function was normal.  - Tricuspid valve: There was no regurgitation.     Neuro/Psych  PSYCHIATRIC DISORDERS Anxiety     negative neurological ROS     GI/Hepatic Neg liver ROS,GERD  ,,  Endo/Other  negative endocrine ROS    Renal/GU negative Renal ROS  negative genitourinary   Musculoskeletal negative musculoskeletal ROS (+)    Abdominal   Peds  Hematology negative hematology ROS (+)   Anesthesia Other Findings   Reproductive/Obstetrics                             Anesthesia Physical Anesthesia Plan  ASA: 2  Anesthesia Plan: General   Post-op Pain Management: Tylenol PO (pre-op)*   Induction: Intravenous  PONV Risk Score and Plan:  3 and Midazolam, Dexamethasone and Ondansetron  Airway Management Planned: Oral ETT and Video Laryngoscope Planned  Additional Equipment:   Intra-op Plan:   Post-operative Plan: Extubation in OR  Informed Consent: I have reviewed the patients History and Physical, chart, labs and discussed the procedure including the risks, benefits and alternatives for the proposed anesthesia with the patient or authorized representative who has indicated his/her understanding and acceptance.     Dental advisory given  Plan Discussed with: CRNA  Anesthesia Plan Comments:        Anesthesia Quick Evaluation

## 2022-09-12 NOTE — Interval H&P Note (Signed)
History and Physical Interval Note:  09/12/2022 12:32 PM  [name] [name]  has presented today for surgery, with the diagnosis of SYMPTOMATIC CHOLELITHIASIS.  The various methods of treatment have been discussed with the patient and family. After consideration of risks, benefits and other options for treatment, the patient has consented to  Procedure(s): LAPAROSCOPIC CHOLECYSTECTOMY WITH ICG DYE, POSSIBLE INTRAOPERATIVE CHOLANGIOGRAM (N/A) as a surgical intervention.  The patient's history has been reviewed, patient examined, no change in status, stable for surgery.  I have reviewed the patient's chart and labs.  Questions were answered to the patient's satisfaction.    Discussed surgical resident involvement with case  Leighton Ruff. Redmond Pulling, MD, [place], Bariatric, & Minimally Invasive Surgery [facility],  [place]  [place]

## 2022-09-12 NOTE — Transfer of Care (Signed)
Immediate Anesthesia Transfer of Care Note  Patient: [name] [name]  Procedure(s) Performed: LAPAROSCOPIC CHOLECYSTECTOMY WITH ICG DYE (Abdomen)  Patient Location: PACU  Anesthesia Type:General  Level of Consciousness: sedated, patient cooperative, and responds to stimulation  Airway & Oxygen Therapy: Patient Spontanous Breathing and Patient connected to face mask oxygen  Post-op Assessment: Report given to RN and Post -op Vital signs reviewed and stable  Post vital signs: Reviewed and stable  Last Vitals:  Vitals Value Taken Time  BP 165/90 09/12/22 1506  Temp    Pulse 78 09/12/22 1507  Resp    SpO2 100 % 09/12/22 1507  Vitals shown include unvalidated device data.  Last Pain:  Vitals:   09/12/22 1130  TempSrc: Oral  PainSc: 3       Patients Stated Pain Goal: 0 (70/26/37 8588)  Complications:  Encounter Notable Events  Notable Event Outcome Phase Comment  Difficult to intubate - expected  Intraprocedure Filed from anesthesia note documentation.

## 2022-09-12 NOTE — Op Note (Signed)
[name] [name] [id number] [birth year] 09/12/2022  Laparoscopic Cholecystectomy with near infrared fluorescent cholangiography procedure Note  Indications: This patient presents with symptomatic gallbladder disease and will undergo laparoscopic cholecystectomy.  Pre-operative Diagnosis: symptomatic cholelithiasis  Post-operative Diagnosis: same  Surgeon: Greer Pickerel MD FACS  Surgery Resident: Sheldon Silvan MD PGY-3  Anesthesia: General endotracheal anesthesia   Procedure Details  The patient was seen again in the Holding Room. The risks, benefits, complications, treatment options, and expected outcomes were discussed with the patient. The possibilities of reaction to medication, pulmonary aspiration, perforation of viscus, bleeding, recurrent infection, finding a normal gallbladder, the need for additional procedures, failure to diagnose a condition, the possible need to convert to an open procedure, and creating a complication requiring transfusion or operation were discussed with the patient. The likelihood of improving the patient's symptoms with return to their baseline status is good.  The patient and/or family concurred with the proposed plan, giving informed consent. The site of surgery properly noted. The patient was taken to Operating Room, identified as [name] and the procedure verified as Laparoscopic Cholecystectomy with ICG dye.  A Time Out was held and the above information confirmed. Antibiotic prophylaxis was administered.    ICG dye was administered preoperatively.    General endotracheal anesthesia was then administered and tolerated well. After the induction, the abdomen was prepped with Chloraprep and draped in the sterile fashion. The patient was positioned in the supine position.  Local anesthetic agent was injected into the skin near the umbilicus and an incision made.  The patient had a pre-existing small fascial defect at the umbilicus.  So given the distance  between his xiphoid and his umbilicus we made a transverse supraumbilical curvilinear incision.  We dissected down to the abdominal fascia with blunt dissection.  The fascia was entered through the pre-existing small fascial defect and was further incised vertically and we entered the peritoneal cavity bluntly.  A pursestring suture of 0-Vicryl was placed around the fascial opening.  The Hasson cannula was inserted and secured with the stay suture.  Pneumoperitoneum was then created with CO2 and tolerated well without any adverse changes in the patient's vital signs. An 5-mm port was placed in the subxiphoid position.  Two 5-mm ports were placed in the right upper quadrant. All skin incisions were infiltrated with a local anesthetic agent before making the incision and placing the trocars.   We positioned the patient in reverse Trendelenburg, tilted slightly to the patient's left.  The patient had had a prior right soft tissue excision involving his right chest wall several years ago.  On follow-up imaging there was suggestion of scarring between the dome of the liver and the diaphragm.  Interestingly the right edge of the liver and the right liver was slightly rotated upward and slightly medial. The gallbladder was identified, the fundus grasped and retracted cephalad. Adhesions were lysed bluntly and with the electrocautery where indicated, taking care not to injure any adjacent organs or viscus. The infundibulum was grasped and retracted laterally, exposing the peritoneum overlying the triangle of Calot. This was then divided and exposed in a blunt fashion. A critical view of the cystic duct and cystic artery was obtained.  The cystic duct was clearly identified and bluntly dissected circumferentially.  Utilizing the [employer] camera system near infrared fluorescent activity was visualized in the liver, cystic duct, common hepatic duct and common bile duct.  This served as a secondary confirmation of our  anatomy.  The cystic duct was  then ligated with clips and divided. The cystic artery which had been identified & dissected free was ligated with clips and divided as well.  Part of the infundibulum tore as we were mobilizing the gallbladder up out of the gallbladder fossa.  There was no spillage of gallstones.  The gallbladder was dissected from the liver bed in retrograde fashion with the electrocautery.  The posterior wall the gallbladder was intimately adhered to the liver surface and there was some bleeding from the liver surface which was not unexpected.  The gallbladder was removed and placed in an Ecco sac.  The gallbladder and Ecco sac were then removed through the umbilical port site. The liver bed was irrigated and inspected. Hemostasis was achieved with the electrocautery. Copious irrigation was utilized and was repeatedly aspirated until clear.  I did elect to place a piece of surgical snow in the gallbladder fossa.  The pursestring suture was used to close the umbilical fascia.  The resident placed an additional interrupted 0 Vicryl at the umbilical fascia using PMI suture passer with laparoscopic guidance.  We again inspected the right upper quadrant for hemostasis.  The umbilical closure was inspected and there was no air leak and nothing trapped within the closure. Pneumoperitoneum was released as we removed the trocars.  We freshened up the skin edges around the umbilical incision.  4-0 Monocryl was used to close the skin.   Dermabond was used to seal the incisions. The patient was then extubated and brought to the recovery room in stable condition. Instrument, sponge, and needle counts were correct at closure and at the conclusion of the case.   Findings: Fair amount of fatty friable tissue around the infundibulum and cystic triangle.  Probable chronic cholecystitis with Cholelithiasis; near infrared cholangiography seen within the common hepatic duct common bile duct and cystic duct and  liver.  Patient's right liver was slightly rotated upward  Estimated Blood Loss: Minimal         Drains: none         Specimens: Gallbladder           Complications: None; patient tolerated the procedure well.         Disposition: PACU - hemodynamically stable.         Condition: stable  I was personally present, scrubbed and performed parts of the procedure (circumferential mobilization of the cystic duct ).  After we closed the fascia and released pneumoperitoneum and released the trocars I left the resident to close the skin but was immediately available, as documented in my operative note.   Leighton Ruff. Redmond Pulling, MD, FACS General, Bariatric, & Minimally Invasive Surgery [facility] Surgery,  [place]

## 2022-09-12 NOTE — H&P (Signed)
REFERRING PHYSICIAN: Janus Molder*  PROVIDER: Simaya Lumadue Leanne Chang, MD  MRN: [id number] DOB: 1986/08/27 DATE OF ENCOUNTER: 09/01/2022  Subjective  Chief Complaint: New Consultation (Gallbladder)   History of Present Illness: [name] [name] is a 36 y.o. male who is seen today as an office consultation at the request of Dr. Dianah Field for evaluation of New Consultation (Gallbladder) . Patient presented to the ER on July 30 with complaints of severe epigastric pain radiating to his right upper quadrant and then rating to his back. It was associate with nausea but no vomiting or fever. The patient was so uncomfortable he presented to the emergency room. He was given Wegovy at the time. His LFTs were elevated in the emergency room. He had a CT scan, ultrasound and MRCP which at that time did not demonstrate any gallstones but just biliary sludge. Was felt that perhaps he passed a gallstone. His hepatitis panel was negative. The patient also has history of right chest wall resection for a tumor with mesh placement in 2022. He states that he is continue to have what he describes as a tooth ache on his right side. It is worse at times. It can sometimes increase and worsen after eating. When it increases in discomfort the increased discomfort last for about an hour. It is not always associated with eating. He does have increased bowel movement frequency when he does have an episode. He does sometimes have discomfort on his right side with turning and twisting. He is no longer on Wegovy. His weight is stable. No vomiting. No fever or chills. Because of the continued right-sided pain his PCP got a follow-up chest CT recently to make sure there was no evidence of tumor recurrence. There was not but the CT chest did demonstrate numerous gallstones    Review of Systems: A complete review of systems was obtained from the patient. I have reviewed this information and discussed as appropriate with the  patient. See HPI as well for other ROS.  ROS  Medical History: Past Medical History: Diagnosis Date Anxiety History of cancer  Patient Active Problem List Diagnosis Adult ADHD Raynaud phenomenon  Past Surgical History: Procedure Laterality Date chest wall mass resection with mesh 03/2021 Right   No Known Allergies  Current Outpatient Medications on File Prior to Visit Medication Sig Dispense Refill amLODIPine (NORVASC) 2.5 MG tablet Take 1 tablet by mouth once daily melatonin 1 mg tablet Take by mouth meloxicam (MOBIC) 15 MG tablet Take 1 tablet by mouth once daily methylphenidate HCl (CONCERTA) 18 MG ER tablet Take by mouth niacin 500 MG tablet Take by mouth vortioxetine (TRINTELLIX) 10 mg tablet Take 1 tablet by mouth once daily  No current facility-administered medications on file prior to visit.  Family History Problem Relation Age of Onset High blood pressure (Hypertension) Mother Skin cancer Father Obesity Father High blood pressure (Hypertension) Father   Social History  Tobacco Use Smoking Status Never Smokeless Tobacco Never   Social History  Socioeconomic History Marital status: Single Tobacco Use Smoking status: Never Smokeless tobacco: Never Vaping Use Vaping Use: Never used Substance and Sexual Activity Alcohol use: Not Currently Drug use: Never  Objective:  Vitals: 09/01/22 0844 09/01/22 0847 BP: 120/80 Pulse: 94 Temp: 36.6 C (97.9 F) SpO2: 98% Weight: (!) 109 kg (240 lb 6.4 oz) Height: 175.3 cm ('5\' 9"'$ ) PainSc: 4  Body mass index is 35.5 kg/m.  Constitutional: NAD; conversant; no deformities Eyes: Moist conjunctiva; no lid lag; anicteric; PERRL Neck: Trachea midline; no thyromegaly Lungs:  Normal respiratory effort; no tactile fremitus CV: RRR; no palpable thrills; no pitting edema GI: Abd soft, nd, mild TTP RUQ; old right oblique mid chest wall scar; no palpable hepatosplenomegaly, small fascial defect at umbilicus MSK:  Normal gait; no clubbing/cyanosis Psychiatric: Appropriate affect; alert and oriented x3 Lymphatic: No palpable cervical or axillary lymphadenopathy Skin:no rash/lesion/jaundice  Labs, Imaging and Diagnostic Testing: Dr Kipp Brood op note 04/06/2021  Dr Dianah Field Pcp note 08/02/22 & 05/02/22  Dr Therisa Doyne note 04/24/22  MRI/MRCP 04/24/22 FINDINGS: Lower Chest: No acute findings.  Hepatobiliary: Postop changes again seen involving the right anterolateral chest wall soft tissues and liver dome. No hepatic masses identified. No evidence of steatosis. Gallbladder is unremarkable. No evidence of biliary ductal dilatation or choledocholithiasis.  Pancreas: No mass or inflammatory changes. No evidence of pancreatic ductal dilatation or pancreas divisum.  Spleen: Within normal limits in size and appearance.  Adrenals/Urinary Tract: No masses identified. No evidence of ureteral calculi or hydronephrosis.  Stomach/Bowel: No evidence of obstruction, inflammatory process or abnormal fluid collections.  Vascular/Lymphatic: No pathologically enlarged lymph nodes. No acute vascular findings.  Reproductive: No mass or other significant abnormality.  Other: None.  Musculoskeletal: No suspicious bone lesions identified.  IMPRESSION: No acute findings. No evidence of hepatobiliary disease or other significant abnormality.  Abd u/s 04/24/22 IMPRESSION: 1. Focal area of thickening along the gallbladder wall measuring up to 7 mm without gallbladder wall fluid or edema. It is uncertain if this reflects a fold of the gallbladder wall or true wall thickening remainder of the gallbladder wall is normal in appearance. Gallbladder sludge is present. No shadowing stones. No Murphy sign on exam. 2. The echogenicity of the liver is mildly increased. This is a nonspecific finding but is most commonly seen with fatty infiltration of the liver. There are no obvious focal liver lesions  CT a/p  04/24/22  IMPRESSION: 1. No acute intra-abdominal or pelvic pathology. No bowel obstruction. Normal appendix. 2. Postsurgical changes of resection of the previously seen mass in the anterolateral right lung.  Assessment and Plan:   Diagnoses and all orders for this visit:  Symptomatic cholelithiasis  History of thoracic surgery    I believe the majority of the patient's symptoms are consistent with gallbladder disease.  We discussed gallbladder disease. The patient was given Neurosurgeon. We discussed non-operative and operative management. We discussed the signs & symptoms of acute cholecystitis  I discussed laparoscopic cholecystectomy with possible IOC in detail. The patient was given educational material as well as diagrams detailing the procedure. We discussed the risks and benefits of a laparoscopic cholecystectomy including, but not limited to bleeding, infection, injury to surrounding structures such as the intestine or liver, bile leak, retained gallstones, need to convert to an open procedure, prolonged diarrhea, blood clots such as DVT, common bile duct injury, anesthesia risks, and possible need for additional procedures. We discussed the typical post-operative recovery course. I explained that the likelihood of improvement of their symptoms is good.  Did discuss with him that some of his right-sided discomfort especially with certain movements may not be related to gallbladder pain but be due to scarring from his prior chest wall and rib surgery. He did have mesh fixated to ribs. So therefore we discussed that cholecystectomy may not ameliorate all of his right-sided as well as back pain. But he is tender to palpation over the gallbladder, has had evidence of passing a gallstone and has cholelithiasis on imaging.  Did discuss the possibility that it may be  hard to retract the gallbladder and lifted up due to potential scarring of the dome of the liver to the diaphragm  from his prior chest wall surgery which increases his risk of conversion to open.  This patient encounter took 61 minutes today to perform the following: take history, perform exam, review outside records, interpret imaging, counsel the patient on their diagnosis and document encounter, findings & plan in the EHR - reviewed extensive amount of medical records and personally reviewed imaging  No follow-ups on file.  Markie Frith Leanne Chang, MD General, Minimally Invasive, & Bariatric Surgery     Electronically signed by Rudean Curt, MD at 09/01/2022 9:14 AM EST

## 2022-09-12 NOTE — Anesthesia Procedure Notes (Signed)
Procedure Name: Intubation Date/Time: 09/12/2022 1:28 PM  Performed by: Lavina Hamman, CRNAPre-anesthesia Checklist: Patient identified, Emergency Drugs available, Suction available, Patient being monitored and Timeout performed Patient Re-evaluated:Patient Re-evaluated prior to induction Oxygen Delivery Method: Circle system utilized Preoxygenation: Pre-oxygenation with 100% oxygen Induction Type: IV induction Ventilation: Mask ventilation without difficulty Laryngoscope Size: 4 and Glidescope Grade View: Grade I Tube type: Oral Tube size: 7.0 mm Number of attempts: 1 Airway Equipment and Method: Stylet Placement Confirmation: ETT inserted through vocal cords under direct vision, positive ETCO2, CO2 detector and breath sounds checked- equal and bilateral Secured at: 24 cm Tube secured with: Tape Dental Injury: Teeth and Oropharynx as per pre-operative assessment  Difficulty Due To: Difficulty was anticipated and Difficult Airway- due to limited oral opening Comments: ATOI. Large neck, easy mask.

## 2022-09-13 ENCOUNTER — Encounter (HOSPITAL_COMMUNITY): Payer: Self-pay | Admitting: General Surgery

## 2022-09-13 LAB — SURGICAL PATHOLOGY

## 2022-09-13 NOTE — Anesthesia Postprocedure Evaluation (Signed)
Anesthesia Post Note  Patient: EILAM SHREWSBURY  Procedure(s) Performed: LAPAROSCOPIC CHOLECYSTECTOMY WITH ICG DYE (Abdomen)     Patient location during evaluation: PACU Anesthesia Type: General Level of consciousness: awake and alert Pain management: pain level controlled Vital Signs Assessment: post-procedure vital signs reviewed and stable Respiratory status: spontaneous breathing, nonlabored ventilation, respiratory function stable and patient connected to nasal cannula oxygen Cardiovascular status: blood pressure returned to baseline and stable Postop Assessment: no apparent nausea or vomiting Anesthetic complications: yes  Encounter Notable Events  Notable Event Outcome Phase Comment  Difficult to intubate - expected  Intraprocedure Filed from anesthesia note documentation.    Last Vitals:  Vitals:   09/12/22 1600 09/12/22 1625  BP: 136/78 (!) 140/86  Pulse: 79 77  Resp: 14 14  Temp: 36.5 C 36.5 C  SpO2: 98% 98%    Last Pain:  Vitals:   09/12/22 1625  TempSrc:   PainSc: 2                  Jiovani Mccammon L Marjory Meints

## 2022-09-22 ENCOUNTER — Other Ambulatory Visit (HOSPITAL_COMMUNITY): Payer: Self-pay

## 2022-10-04 ENCOUNTER — Ambulatory Visit
Admission: RE | Admit: 2022-10-04 | Discharge: 2022-10-04 | Disposition: A | Discharge status: Home / Self Care | Payer: 59 | Source: Ambulatory Visit | Attending: Family Medicine | Admitting: Family Medicine

## 2022-10-04 ENCOUNTER — Other Ambulatory Visit (HOSPITAL_BASED_OUTPATIENT_CLINIC_OR_DEPARTMENT_OTHER): Payer: Self-pay

## 2022-10-04 VITALS — BP 119/84 | HR 102 | Temp 102.0°F | Resp 18 | Ht 69.0 in | Wt 230.0 lb

## 2022-10-04 DIAGNOSIS — R059 Cough, unspecified: Secondary | ICD-10-CM | POA: Diagnosis not present

## 2022-10-04 DIAGNOSIS — J101 Influenza due to other identified influenza virus with other respiratory manifestations: Secondary | ICD-10-CM

## 2022-10-04 LAB — POCT INFLUENZA A/B
Influenza A, POC: NEGATIVE
Influenza B, POC: POSITIVE — AB

## 2022-10-04 MED ORDER — BENZONATATE 200 MG PO CAPS
200.0000 mg | ORAL_CAPSULE | Freq: Three times a day (TID) | ORAL | 0 refills | Status: AC | PRN
  Filled 2022-10-04: qty 40, 14d supply, fill #0

## 2022-10-04 MED ORDER — PROMETHAZINE-DM 6.25-15 MG/5ML PO SYRP
5.0000 mL | ORAL_SOLUTION | Freq: Two times a day (BID) | ORAL | 0 refills | Status: AC | PRN
  Filled 2022-10-04: qty 118, 12d supply, fill #0

## 2022-10-04 MED ORDER — ACETAMINOPHEN 325 MG PO TABS
650.0000 mg | ORAL_TABLET | Freq: Once | ORAL | Status: AC
  Administered 2022-10-04: 650 mg via ORAL

## 2022-10-04 MED ORDER — OSELTAMIVIR PHOSPHATE 75 MG PO CAPS
75.0000 mg | ORAL_CAPSULE | Freq: Two times a day (BID) | ORAL | 0 refills | Status: AC
  Filled 2022-10-04: qty 10, 5d supply, fill #0

## 2022-10-04 NOTE — ED Triage Notes (Signed)
Per pt, "cough, fever, sob, mild nausea, lethargy, joint pain" since last night  Tmax 101.5 Nyquil last night helped  Nyquil last dose  0400

## 2022-10-04 NOTE — Discharge Instructions (Addendum)
Advised patient to take medications as directed with food to completion.  Advised may take OTC Tylenol 1000 mg every 6 hours for fever/arthralgias.  Advised may take Tessalon Perles daily or as needed for cough.  Advised may take Promethazine DM at night prior to sleep for cough due to sedative effects.  Advised do not use cough medications together.  Encouraged patient to increase daily water intake to 64 ounces per day while taking these medications.  Advised if symptoms worsen and/or unresolved please follow-up with PCP or here for further evaluation.

## 2022-10-04 NOTE — ED Provider Notes (Signed)
[name]    CSN: [id number] Arrival date & time: 10/04/22  1105      History   Chief Complaint Chief Complaint  Patient presents with   Cough    HPI YACQUB BASTON is a 37 y.o. male.   HPI 37 year old male presents with cough, fever, shortness of breath and mild nausea lethargy and joint pain since last night cough  Past Medical History:  Diagnosis Date   ADD (attention deficit disorder)    Anxiety    Cancer ([place]) 2022   chondrosarcoma   Family history of breast cancer    Family history of pancreatic cancer    Family history of prostate cancer    GERD (gastroesophageal reflux disease)    History of chondrosarcoma    History of kidney stones    Hyperlipidemia    Mitral valve prolapse    PONV (postoperative nausea and vomiting)    Raynaud disease    Seasonal allergies     Patient Active Problem List   Diagnosis Date Noted   Low TSH level 08/02/2022   Right upper quadrant pain 04/24/2022   Overweight 04/24/2022   Genetic testing 06/30/2021   Family history of breast cancer 06/07/2021   Family history of pancreatic cancer 06/07/2021   Mass of knee, right 06/01/2021   Left nephrolithiasis 03/10/2021   Mass of right chest wall 03/10/2021   Adjustment reaction with anxiety and depression 12/16/2020   Vitamin D deficiency 01/16/2020   Family history of prostate cancer 06/23/2017   Raynaud phenomenon 12/05/2016   Hypertriglyceridemia 05/17/2016   History of Kawasaki's disease 05/13/2016   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   Obesity (BMI 30-39.9) 08/19/2014   Acne vulgaris 06/19/2013   Adult ADHD 04/30/2013   Annual physical exam 08/07/2012    Past Surgical History:  Procedure Laterality Date   CHEST WALL RECONSTRUCTION N/A 04/06/2021   Procedure: CHEST WALL RESECTION WITH MESH RECONSTRUCTION;  Surgeon: Lajuana Matte, MD;  Location: [facility];  Service: Thoracic;  Laterality: N/A;   CHOLECYSTECTOMY N/A 09/12/2022   Procedure: LAPAROSCOPIC  CHOLECYSTECTOMY WITH ICG DYE;  Surgeon: Greer Pickerel, MD;  Location: [facility];  Service: General;  Laterality: N/A;   INNER EAR SURGERY         Home Medications    Prior to Admission medications   Medication Sig Start Date End Date Taking? Authorizing Provider  benzonatate (TESSALON) 200 MG capsule Take 1 capsule (200 mg total) by mouth 3 (three) times daily as needed for up to 7 days. 10/04/22 10/18/22 Yes Eliezer Lofts, FNP  oseltamivir (TAMIFLU) 75 MG capsule Take 1 capsule (75 mg total) by mouth every 12 (twelve) hours. 10/04/22  Yes Eliezer Lofts, FNP  promethazine-dextromethorphan (PROMETHAZINE-DM) 6.25-15 MG/5ML syrup Take 5 mLs by mouth 2 (two) times daily as needed for cough. 10/04/22  Yes Eliezer Lofts, FNP  amLODipine (NORVASC) 2.5 MG tablet TAKE 1 TABLET BY MOUTH ONCE DAILY Patient taking differently: Take 2.5 mg by mouth every morning. 01/10/22 01/10/23  Silverio Decamp, MD  Famotidine (PEPCID PO) Take 1 tablet by mouth daily as needed (heartburn).    [provider]  fluticasone (FLONASE) 50 MCG/ACT nasal spray Place 1 spray into both nostrils daily as needed for allergies or rhinitis. 07/06/21   Raylene Everts, MD  ibuprofen (ADVIL) 200 MG tablet Take 400 mg by mouth every 6 (six) hours as needed (pain).    [provider]  melatonin 1 MG TABS tablet Take 8 mg by mouth at bedtime.  [provider]  meloxicam (MOBIC) 15 MG tablet Take 1 tablet (15 mg total) by mouth daily. 06/10/22 06/10/23  Silverio Decamp, MD  methylphenidate (CONCERTA) 18 MG PO CR tablet Take 1 tablet (18 mg total) by mouth daily. 09/06/22 03/05/23  Silverio Decamp, MD  niacin 500 MG tablet Take 500 mg by mouth at bedtime.    [provider]  ondansetron (ZOFRAN-ODT) 8 MG disintegrating tablet Take 1 tablet (8 mg total) by mouth every 8 (eight) hours as needed for nausea. 07/28/21   Silverio Decamp, MD  oxyCODONE (OXY IR/ROXICODONE) 5 MG immediate  release tablet Take 1 tablet (5 mg total) by mouth every 6 (six) hours as needed for severe pain. Patient not taking: Reported on 10/04/2022 09/12/22   Greer Pickerel, MD  vortioxetine HBr (TRINTELLIX) 10 MG TABS tablet Take 1 tablet (10 mg total) by mouth daily. 01/10/22 01/10/23  Silverio Decamp, MD  promethazine (PHENERGAN) 25 MG tablet Take 1 tablet (25 mg total) by mouth every 6 (six) hours as needed for nausea. Patient not taking: Reported on 03/15/2021 03/10/21 03/16/21  Silverio Decamp, MD  sertraline (ZOLOFT) 25 MG tablet Take 1 tablet (25 mg total) by mouth daily. 11/30/20 12/16/20  Silverio Decamp, MD  topiramate (TOPAMAX) 50 MG tablet TAKE 1 TABLET BY MOUTH TWICE DAILY 11/10/20 11/30/20  Silverio Decamp, MD    Family History Family History  Problem Relation Age of Onset   Hypertension Father    Prostate cancer Father        dx late 68s   Melanoma Father        dx 29s, on wrist   Prostate cancer Paternal Uncle        dx 73s   Melanoma Paternal Uncle    Breast cancer Maternal Grandmother    Pancreatic cancer Maternal Grandmother    Stroke Maternal Grandfather    Hypertension Paternal Grandmother    Other Paternal Grandmother        benign brain tumor   Alzheimer's disease Paternal Grandfather    Prostate cancer Paternal Grandfather     Social History Social History   Tobacco Use   Smoking status: Never   Smokeless tobacco: Never  Vaping Use   Vaping Use: Never used  Substance Use Topics   Alcohol use: No   Drug use: No     Allergies   Patient has no known allergies.   Review of Systems Review of Systems  Constitutional:  Positive for fatigue and fever.  Respiratory:  Positive for cough.   Musculoskeletal:  Positive for arthralgias.  All other systems reviewed and are negative.    Physical Exam Triage Vital Signs ED Triage Vitals  Enc Vitals Group     BP 10/04/22 1129 119/84     Pulse Rate 10/04/22 1129 (!) 102     Resp 10/04/22  1129 18     Temp 10/04/22 1129 (!) 102 F (38.9 C)     Temp Source 10/04/22 1129 Oral     SpO2 10/04/22 1129 99 %     Weight 10/04/22 1131 230 lb (104.3 kg)     Height 10/04/22 1131 '5\' 9"'$  (1.753 m)     Head Circumference --      Peak Flow --      Pain Score 10/04/22 1131 4     Pain Loc --      Pain Edu? --      Excl. in [place]? --  No data found.  Updated Vital Signs BP 119/84 (BP Location: Left Arm)   Pulse (!) 102   Temp (!) 102 F (38.9 C) (Oral)   Resp 18   Ht '5\' 9"'$  (1.753 m)   Wt 230 lb (104.3 kg)   SpO2 99%   BMI 33.97 kg/m   Visual Acuity Right Eye Distance:   Left Eye Distance:   Bilateral Distance:    Right Eye Near:   Left Eye Near:    Bilateral Near:     Physical Exam Vitals and nursing note reviewed.  Constitutional:      General: He is not in acute distress.    Appearance: Normal appearance. He is normal weight. He is ill-appearing.  HENT:     Head: Normocephalic and atraumatic.     Right Ear: Tympanic membrane, ear canal and external ear normal.     Left Ear: Tympanic membrane, ear canal and external ear normal.     Mouth/Throat:     Mouth: Mucous membranes are moist.     Pharynx: Oropharynx is clear.  Eyes:     Extraocular Movements: Extraocular movements intact.     Conjunctiva/sclera: Conjunctivae normal.     Pupils: Pupils are equal, round, and reactive to light.  Cardiovascular:     Rate and Rhythm: Normal rate and regular rhythm.     Pulses: Normal pulses.     Heart sounds: Normal heart sounds.  Pulmonary:     Effort: Pulmonary effort is normal.     Breath sounds: Normal breath sounds. No wheezing, rhonchi or rales.  Musculoskeletal:        General: Normal range of motion.     Cervical back: Normal range of motion and neck supple.  Skin:    General: Skin is warm and dry.  Neurological:     General: No focal deficit present.     Mental Status: He is alert and oriented to person, place, and time. Mental status is at baseline.       UC Treatments / Results  Labs (all labs ordered are listed, but only abnormal results are displayed) Labs Reviewed  POCT INFLUENZA A/B - Abnormal; Notable for the following components:      Result Value   Influenza B, POC Positive (*)    All other components within normal limits    EKG   Radiology No results found.  Procedures Procedures (including critical care time)  Medications Ordered in UC Medications  acetaminophen (TYLENOL) tablet 650 mg (650 mg Oral Given 10/04/22 1140)    Initial Impression / Assessment and Plan / UC Course  I have reviewed the triage vital signs and the nursing notes.  Pertinent labs & imaging results that were available during my care of the patient were reviewed by me and considered in my medical decision making (see chart for details).     MDM: 1.  Influenza B-Rx'd Tamiflu; 2.  Cough Rx Tessalon, Promethazine DM; 3.  Fever-advised OTC Tylenol. Advised patient to take medications as directed with food to completion.  Advised may take OTC Tylenol 1000 mg every 6 hours for fever/arthralgias.  Advised may take Tessalon Perles daily or as needed for cough.  Advised may take Promethazine DM at night prior to sleep for cough due to sedate of effects.  Advised do not use cough medications together.  Encouraged patient to increase daily water intake to 64 ounces per day while taking these medications.  Advised if symptoms worsen and/or unresolved please follow-up with PCP  or here for further evaluation.  Final Clinical Impressions(s) / UC Diagnoses   Final diagnoses:  Influenza B  Cough, unspecified type     Discharge Instructions      Advised patient to take medications as directed with food to completion.  Advised may take OTC Tylenol 1000 mg every 6 hours for fever/arthralgias.  Advised may take Tessalon Perles daily or as needed for cough.  Advised may take Promethazine DM at night prior to sleep for cough due to sedative effects.  Advised  do not use cough medications together.  Encouraged patient to increase daily water intake to 64 ounces per day while taking these medications.  Advised if symptoms worsen and/or unresolved please follow-up with PCP or here for further evaluation.    ED Prescriptions     Medication Sig Dispense Auth. Provider   oseltamivir (TAMIFLU) 75 MG capsule Take 1 capsule (75 mg total) by mouth every 12 (twelve) hours. 10 capsule Eliezer Lofts, FNP   benzonatate (TESSALON) 200 MG capsule Take 1 capsule (200 mg total) by mouth 3 (three) times daily as needed for up to 7 days. 40 capsule Eliezer Lofts, FNP   promethazine-dextromethorphan (PROMETHAZINE-DM) 6.25-15 MG/5ML syrup Take 5 mLs by mouth 2 (two) times daily as needed for cough. 118 mL Eliezer Lofts, FNP      PDMP not reviewed this encounter.   Eliezer Lofts, [place] 10/04/22 1224

## 2022-10-05 ENCOUNTER — Telehealth: Payer: Self-pay | Admitting: Emergency Medicine

## 2022-10-05 NOTE — Telephone Encounter (Signed)
LMTRC.  Advised if doing well to disregard the call.  Any questions or concerns, feel free to contact the office. 

## 2022-10-19 ENCOUNTER — Other Ambulatory Visit (HOSPITAL_BASED_OUTPATIENT_CLINIC_OR_DEPARTMENT_OTHER): Payer: Self-pay

## 2022-10-19 ENCOUNTER — Other Ambulatory Visit: Payer: Self-pay

## 2022-10-19 ENCOUNTER — Other Ambulatory Visit: Payer: Self-pay | Admitting: Sports Medicine

## 2022-10-19 DIAGNOSIS — I7300 Raynaud's syndrome without gangrene: Secondary | ICD-10-CM

## 2022-10-19 MED ORDER — AMLODIPINE BESYLATE 2.5 MG PO TABS
2.5000 mg | ORAL_TABLET | Freq: Every day | ORAL | 2 refills | Status: DC
  Filled 2022-10-19: qty 90, 90d supply, fill #0
  Filled 2023-01-13: qty 90, 90d supply, fill #1
  Filled 2023-05-07: qty 90, 90d supply, fill #2

## 2022-10-25 ENCOUNTER — Other Ambulatory Visit: Payer: Self-pay | Admitting: Thoracic Surgery (Cardiothoracic Vascular Surgery)

## 2022-10-25 DIAGNOSIS — R222 Localized swelling, mass and lump, trunk: Secondary | ICD-10-CM

## 2022-12-13 ENCOUNTER — Other Ambulatory Visit (HOSPITAL_BASED_OUTPATIENT_CLINIC_OR_DEPARTMENT_OTHER): Payer: Self-pay

## 2022-12-15 ENCOUNTER — Other Ambulatory Visit (HOSPITAL_BASED_OUTPATIENT_CLINIC_OR_DEPARTMENT_OTHER): Payer: Self-pay

## 2022-12-15 MED ORDER — CHOLESTYRAMINE LIGHT 4 G PO PACK
PACK | ORAL | 11 refills | Status: DC
  Filled 2022-12-15: qty 60, 30d supply, fill #0
  Filled 2023-01-13: qty 60, 30d supply, fill #1
  Filled 2023-03-31: qty 60, 30d supply, fill #2
  Filled 2023-05-07: qty 60, 30d supply, fill #3
  Filled 2023-06-02: qty 60, 30d supply, fill #4
  Filled 2023-07-04: qty 60, 30d supply, fill #5
  Filled 2023-08-16: qty 60, 30d supply, fill #6
  Filled 2023-09-11: qty 60, 30d supply, fill #7
  Filled 2023-10-11: qty 60, 30d supply, fill #8

## 2022-12-16 ENCOUNTER — Other Ambulatory Visit (HOSPITAL_BASED_OUTPATIENT_CLINIC_OR_DEPARTMENT_OTHER): Payer: Self-pay

## 2022-12-29 ENCOUNTER — Ambulatory Visit (INDEPENDENT_AMBULATORY_CARE_PROVIDER_SITE_OTHER): Payer: 59

## 2022-12-29 DIAGNOSIS — R222 Localized swelling, mass and lump, trunk: Secondary | ICD-10-CM | POA: Diagnosis not present

## 2022-12-29 DIAGNOSIS — S2231XA Fracture of one rib, right side, initial encounter for closed fracture: Secondary | ICD-10-CM | POA: Diagnosis not present

## 2022-12-30 ENCOUNTER — Other Ambulatory Visit: Payer: 59

## 2022-12-30 ENCOUNTER — Telehealth: Payer: 59 | Admitting: Thoracic Surgery (Cardiothoracic Vascular Surgery)

## 2023-01-02 ENCOUNTER — Other Ambulatory Visit (HOSPITAL_BASED_OUTPATIENT_CLINIC_OR_DEPARTMENT_OTHER): Payer: Self-pay

## 2023-01-02 ENCOUNTER — Other Ambulatory Visit: Payer: Self-pay | Admitting: Sports Medicine

## 2023-01-05 ENCOUNTER — Encounter: Payer: Self-pay | Admitting: Dermatology

## 2023-01-05 ENCOUNTER — Ambulatory Visit (INDEPENDENT_AMBULATORY_CARE_PROVIDER_SITE_OTHER): Payer: 59 | Admitting: Dermatology

## 2023-01-05 VITALS — BP 147/99

## 2023-01-05 DIAGNOSIS — D1801 Hemangioma of skin and subcutaneous tissue: Secondary | ICD-10-CM

## 2023-01-05 DIAGNOSIS — W908XXA Exposure to other nonionizing radiation, initial encounter: Secondary | ICD-10-CM

## 2023-01-05 DIAGNOSIS — D239 Other benign neoplasm of skin, unspecified: Secondary | ICD-10-CM

## 2023-01-05 DIAGNOSIS — D225 Melanocytic nevi of trunk: Secondary | ICD-10-CM

## 2023-01-05 DIAGNOSIS — L821 Other seborrheic keratosis: Secondary | ICD-10-CM

## 2023-01-05 DIAGNOSIS — L578 Other skin changes due to chronic exposure to nonionizing radiation: Secondary | ICD-10-CM | POA: Diagnosis not present

## 2023-01-05 DIAGNOSIS — Q825 Congenital non-neoplastic nevus: Secondary | ICD-10-CM

## 2023-01-05 DIAGNOSIS — X32XXXA Exposure to sunlight, initial encounter: Secondary | ICD-10-CM

## 2023-01-05 DIAGNOSIS — D492 Neoplasm of unspecified behavior of bone, soft tissue, and skin: Secondary | ICD-10-CM

## 2023-01-05 DIAGNOSIS — Z1283 Encounter for screening for malignant neoplasm of skin: Secondary | ICD-10-CM

## 2023-01-05 HISTORY — DX: Other benign neoplasm of skin, unspecified: D23.9

## 2023-01-05 NOTE — Patient Instructions (Addendum)
Due to recent changes in healthcare laws, you may see results of your pathology and/or laboratory studies on MyChart before the doctors have had a chance to review them. We understand that in some cases there may be results that are confusing or concerning to you. Please understand that not all results are received at the same time and often the doctors may need to interpret multiple results in order to provide you with the best plan of care or course of treatment. Therefore, we ask that you please give [place] 2 business days to thoroughly review all your results before contacting the office for clarification. Should we see a critical lab result, you will be contacted sooner.   If You Need Anything After Your Visit  If you have any questions or concerns for your doctor, please call our main line at [phone] If no one answers, please leave a voicemail as directed and we will return your call as soon as possible. Messages left after 4 pm will be answered the following business day.   You may also send [place] a message via [place]. We typically respond to MyChart messages within 1-2 business days.  For prescription refills, please ask your pharmacy to contact our office. Our fax number is [phone].  If you have an urgent issue when the clinic is closed that cannot wait until the next business day, you can page your doctor at the number below.    Please note that while we do our best to be available for urgent issues outside of office hours, we are not available 24/7.   If you have an urgent issue and are unable to reach [place], you may choose to seek medical care at your doctor's office, retail clinic, urgent care center, or emergency room.  If you have a medical emergency, please immediately call 911 or go to the emergency department. In the event of inclement weather, please call our main line at [phone] for an update on the status of any delays or closures.  Dermatology Medication Tips: Please  keep the boxes that topical medications come in in order to help keep track of the instructions about where and how to use these. Pharmacies typically print the medication instructions only on the boxes and not directly on the medication tubes.   If your medication is too expensive, please contact our office at [phone] or send [place] a message through [place].   We are unable to tell what your co-pay for medications will be in advance as this is different depending on your insurance coverage. However, we may be able to find a substitute medication at lower cost or fill out paperwork to get insurance to cover a needed medication.   If a prior authorization is required to get your medication covered by your insurance company, please allow [place] 1-2 business days to complete this process.  Drug prices often vary depending on where the prescription is filled and some pharmacies may offer cheaper prices.  The website www.goodrx.com contains coupons for medications through different pharmacies. The prices here do not account for what the cost may be with help from insurance (it may be cheaper with your insurance), but the website can give you the price if you did not use any insurance.  - You can print the associated coupon and take it with your prescription to the pharmacy.  - You may also stop by our office during regular business hours and pick up a GoodRx coupon card.  - If you need your  prescription sent electronically to a different pharmacy, notify our office through [facility] MyChart or by phone at [phone]    Patient Handout: Wound Care for Skin Biopsy Site  Patient Handout: Wound Care for Skin Biopsy Site  Taking Care of Your Skin Biopsy Site  Proper care of the biopsy site is essential for promoting healing and minimizing scarring. This handout provides instructions on how to care for your biopsy site to ensure optimal recovery.  1. Cleaning the Wound:  Clean the biopsy site daily  with gentle soap and water. Gently pat the area dry with a clean, soft towel. Avoid harsh scrubbing or rubbing the area, as this can irritate the skin and delay healing.  2. Applying Aquaphor and Bandage:  After cleaning the wound, apply a thin layer of Aquaphor ointment to the biopsy site. Cover the area with a sterile bandage to protect it from dirt, bacteria, and friction. Change the bandage daily or as needed if it becomes soiled or wet.  3. Continued Care for One Week:  Repeat the cleaning, Aquaphor application, and bandaging process daily for one week following the biopsy procedure. Keeping the wound clean and moist during this initial healing period will help prevent infection and promote optimal healing.  4. Massaging Aquaphor into the Area:  ---After one week, discontinue the use of bandages but continue to apply Aquaphor to the biopsy site. ----Gently massage the Aquaphor into the area using circular motions. ---Massaging the skin helps to promote circulation and prevent the formation of scar tissue.   Additional Tips:  Avoid exposing the biopsy site to direct sunlight during the healing process, as this can cause hyperpigmentation or worsen scarring. If you experience any signs of infection, such as increased redness, swelling, warmth, or drainage from the wound, contact your healthcare provider immediately. Follow any additional instructions provided by your healthcare provider for caring for the biopsy site and managing any discomfort. Conclusion:  Taking proper care of your skin biopsy site is crucial for ensuring optimal healing and minimizing scarring. By following these instructions for cleaning, applying Aquaphor, and massaging the area, you can promote a smooth and successful recovery. If you have any questions or concerns about caring for your biopsy site, don't hesitate to contact your healthcare provider for guidance.  Skin Education :   I counseled the patient  regarding the following: Sun screen (SPF 30 or greater) should be applied during peak UV exposure (between 10am and 2pm) and reapplied after exercise or swimming.  The ABCDEs of melanoma were reviewed with the patient, and the importance of monthly self-examination of moles was emphasized. Should any moles change in shape or color, or itch, bleed or burn, pt will contact our office for evaluation sooner then their interval appointment.  Plan: Sunscreen Recommendations I recommended a broad spectrum sunscreen with a SPF of 30 or higher. I explained that SPF 30 sunscreens block approximately 97 percent of the sun's harmful rays. Sunscreens should be applied at least 15 minutes prior to expected sun exposure and then every 2 hours after that as long as sun exposure continues. If swimming or exercising sunscreen should be reapplied every 45 minutes to an hour after getting wet or sweating. One ounce, or the equivalent of a shot glass full of sunscreen, is adequate to protect the skin not covered by a bathing suit. I also recommended a lip balm with a sunscreen as well. Sun protective clothing can be used in lieu of sunscreen but must be worn the   entire time you are exposed to the sun's rays.

## 2023-01-05 NOTE — Progress Notes (Signed)
New Patient Visit   Subjective  [name] [name] is a 37 y.o. male who presents for the following: Skin Cancer Screening and Full Body Skin Exam  Father and Paternal Uncle had history of Melanoma.  Patient states he has a dark mole at his groin he is concerned about. He has had it since childhood. He states it does not bother him and he has not noticed any changes with it.  The patient presents for Total-Body Skin Exam (TBSE) for skin cancer screening and mole check. The patient has spots, moles and lesions to be evaluated, some may be new or changing and the patient has concerns that these could be cancer.    The following portions of the chart were reviewed this encounter and updated as appropriate: medications, allergies, medical history  Review of Systems:  No other skin or systemic complaints except as noted in HPI or Assessment and Plan.  Objective  Well appearing patient in no apparent distress; mood and affect are within normal limits.  A full examination was performed including scalp, head, eyes, ears, nose, lips, neck, chest, axillae, abdomen, back, buttocks, bilateral upper extremities, bilateral lower extremities, hands, feet, fingers, toes, fingernails, and toenails. All findings within normal limits unless otherwise noted below.   A dermatoscope was used during the exam.  The following people were also present during my examination: , my medical assistant (male)   Relevant physical exam findings are noted in the Assessment and Plan.  Pubic Mottled variated macule   Left mid back Mottled brown variated lesion         Assessment & Plan   LENTIGINES, SEBORRHEIC KERATOSES, HEMANGIOMAS - Benign normal skin lesions - Benign-appearing - Call for any changes  MELANOCYTIC NEVI - Tan-brown and/or pink-flesh-colored symmetric macules and papules - Benign appearing on exam today - Observation - Call clinic for new or changing moles - Recommend daily use of  broad spectrum spf 30+ sunscreen to sun-exposed areas.   MILD ACTINIC DAMAGE- - Chronic condition, secondary to cumulative UV/sun exposure - diffuse scaly erythematous macules with underlying dyspigmentation - Recommend daily broad spectrum sunscreen SPF 30+ to sun-exposed areas, reapply every 2 hours as needed.  - Staying in the shade or wearing long sleeves, sun glasses (UVA+UVB protection) and wide brim hats (4-inch brim around the entire circumference of the hat) are also recommended for sun protection.  - Call for new or changing lesions.   I counseled the patient regarding the following: Instructions: Monthly self-skin checks to monitor for any changes in moles are recommended. Expectations: Congenital Nevus are pigmented nests of cells within the skin. No treatment is necessary. Contact Office if: Any moles change in size, shape or color; itch, burn or bleed.  SKIN CANCER SCREENING PERFORMED TODAY.    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 mid back  Skin / nail biopsy Type of biopsy: tangential   Informed consent: discussed and consent obtained   Timeout: patient name, date of birth, surgical site, and procedure verified   Procedure prep:  Patient was prepped and draped in usual sterile fashion Prep type:  Isopropyl alcohol Anesthesia: the lesion was anesthetized in a standard fashion   Anesthetic:  1% lidocaine w/ epinephrine 1-100,000 buffered w/ 8.4% NaHCO3 Instrument used: DermaBlade   Hemostasis achieved with: aluminum chloride   Outcome: patient tolerated procedure well   Post-procedure details: sterile dressing applied and wound care instructions given   Dressing type: petrolatum gauze and bandage    Specimen  1 - Surgical pathology Differential Diagnosis: Dysplastic Nevus  Check Margins: No    Return in about 1 year (around 01/05/2024) for TBSE.  Jaclynn Guarneri, CMA, am acting as scribe for Langston Reusing, MD.   Documentation: I  have reviewed the above documentation for accuracy and completeness, and I agree with the above.  Langston Reusing, MD

## 2023-01-06 ENCOUNTER — Ambulatory Visit (INDEPENDENT_AMBULATORY_CARE_PROVIDER_SITE_OTHER): Payer: 59 | Admitting: Thoracic Surgery (Cardiothoracic Vascular Surgery)

## 2023-01-06 DIAGNOSIS — R222 Localized swelling, mass and lump, trunk: Secondary | ICD-10-CM | POA: Diagnosis not present

## 2023-01-06 NOTE — Progress Notes (Signed)
     [address]       [name] [name] [address]             [phone]       Patient: Home Provider: Office Consent for Telemedicine visit obtained.  Today's visit was completed via a real-time telehealth (see specific modality noted below). The patient/authorized person provided oral consent at the time of the visit to engage in a telemedicine encounter with the present provider at [facility]. The patient/authorized person was informed of the potential benefits, limitations, and risks of telemedicine. The patient/authorized person expressed understanding that the laws that protect confidentiality also apply to telemedicine. The patient/authorized person acknowledged understanding that telemedicine does not provide emergency services and that he or she would need to call 911 or proceed to the nearest hospital for help if such a need arose.   Total time spent in the clinical discussion 10 minutes.  Telehealth Modality: Phone visit (audio only)  I had a telephone visit with [name] [name].  Overall, he is doing well.  He did have some right chest wall pain a few weeks ago, after helping his parents mount a TV.  Now is pain is much improved.    IMPRESSION: 1. No evidence of recurrent or metastatic disease within the chest. 2. New nondisplaced fracture of the anterolateral right seventh rib, likely subacute. 3. Stable postoperative and/or post radiation changes within the right middle lobe adjacent to the operative site.       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  He was subsequently treated with adjuvant radiation.  Overall he is doing well.  His CT chest does show evidence of radiation changes to his chest wall and lung, but there is no residual recurrent tumor.  I will see him back virtually in 1 year with another CT chest.

## 2023-01-10 NOTE — Progress Notes (Signed)
Hi [name] [name]  Dr. Onalee Hua reviewed your biopsy results and it showed the spot removed was a little "abnormal" but not cancerous.  No additional treatment is required.  We will continue to monitor the area for re-pigmentation during your annual skin exams. The detailed report is available to view in MyChart.  Have a great day!  Kind Regards,  Dr. Kermit Balo Care Team

## 2023-01-13 ENCOUNTER — Other Ambulatory Visit: Payer: Self-pay | Admitting: Sports Medicine

## 2023-01-13 ENCOUNTER — Other Ambulatory Visit: Payer: Self-pay

## 2023-01-13 ENCOUNTER — Other Ambulatory Visit (HOSPITAL_BASED_OUTPATIENT_CLINIC_OR_DEPARTMENT_OTHER): Payer: Self-pay

## 2023-01-13 DIAGNOSIS — F4323 Adjustment disorder with mixed anxiety and depressed mood: Secondary | ICD-10-CM

## 2023-01-13 DIAGNOSIS — F909 Attention-deficit hyperactivity disorder, unspecified type: Secondary | ICD-10-CM

## 2023-01-13 MED ORDER — VORTIOXETINE HBR 10 MG PO TABS
10.0000 mg | ORAL_TABLET | Freq: Every day | ORAL | 3 refills | Status: DC
  Filled 2023-01-13: qty 90, 90d supply, fill #0
  Filled 2023-04-21: qty 90, 90d supply, fill #1
  Filled 2023-07-26: qty 90, 90d supply, fill #2
  Filled 2023-10-20: qty 90, 90d supply, fill #3

## 2023-01-13 MED ORDER — METHYLPHENIDATE HCL ER (OSM) 18 MG PO TBCR
18.0000 mg | EXTENDED_RELEASE_TABLET | Freq: Every day | ORAL | 0 refills | Status: DC
  Filled 2023-01-13: qty 30, 30d supply, fill #0
  Filled 2023-02-22: qty 30, 30d supply, fill #1

## 2023-02-02 ENCOUNTER — Other Ambulatory Visit: Payer: Self-pay

## 2023-02-22 ENCOUNTER — Other Ambulatory Visit (HOSPITAL_BASED_OUTPATIENT_CLINIC_OR_DEPARTMENT_OTHER): Payer: Self-pay

## 2023-02-22 ENCOUNTER — Other Ambulatory Visit: Payer: Self-pay

## 2023-02-27 ENCOUNTER — Encounter: Payer: Self-pay | Admitting: Sports Medicine

## 2023-03-06 ENCOUNTER — Other Ambulatory Visit (HOSPITAL_COMMUNITY): Payer: Self-pay

## 2023-03-06 ENCOUNTER — Other Ambulatory Visit (HOSPITAL_BASED_OUTPATIENT_CLINIC_OR_DEPARTMENT_OTHER): Payer: Self-pay

## 2023-03-06 ENCOUNTER — Other Ambulatory Visit: Payer: Self-pay | Admitting: Oncology

## 2023-03-06 DIAGNOSIS — Z006 Encounter for examination for normal comparison and control in clinical research program: Secondary | ICD-10-CM

## 2023-03-07 ENCOUNTER — Other Ambulatory Visit (HOSPITAL_BASED_OUTPATIENT_CLINIC_OR_DEPARTMENT_OTHER): Payer: Self-pay

## 2023-03-23 ENCOUNTER — Other Ambulatory Visit (HOSPITAL_COMMUNITY)
Admission: RE | Admit: 2023-03-23 | Discharge: 2023-03-23 | Disposition: A | Discharge status: Home / Self Care | Payer: 59 | Source: Ambulatory Visit

## 2023-03-23 DIAGNOSIS — Z006 Encounter for examination for normal comparison and control in clinical research program: Secondary | ICD-10-CM

## 2023-03-24 ENCOUNTER — Other Ambulatory Visit (HOSPITAL_COMMUNITY): Payer: Self-pay

## 2023-03-24 ENCOUNTER — Other Ambulatory Visit: Payer: Self-pay | Admitting: Sports Medicine

## 2023-03-24 DIAGNOSIS — F909 Attention-deficit hyperactivity disorder, unspecified type: Secondary | ICD-10-CM

## 2023-03-24 MED ORDER — METHYLPHENIDATE HCL ER (OSM) 18 MG PO TBCR
18.0000 mg | EXTENDED_RELEASE_TABLET | Freq: Every day | ORAL | 0 refills | Status: DC
Start: 2023-03-24 — End: 2023-07-04
  Filled 2023-03-24: qty 90, 90d supply, fill #0

## 2023-03-27 ENCOUNTER — Other Ambulatory Visit: Payer: Self-pay

## 2023-03-27 ENCOUNTER — Other Ambulatory Visit (HOSPITAL_COMMUNITY): Payer: Self-pay

## 2023-03-27 ENCOUNTER — Encounter: Payer: Self-pay | Admitting: Pharmacist

## 2023-03-31 ENCOUNTER — Other Ambulatory Visit (HOSPITAL_COMMUNITY): Payer: Self-pay

## 2023-04-05 LAB — HELIX MOLECULAR SCREEN: Genetic Analysis Overall Interpretation: NEGATIVE

## 2023-04-20 ENCOUNTER — Other Ambulatory Visit (HOSPITAL_BASED_OUTPATIENT_CLINIC_OR_DEPARTMENT_OTHER): Payer: Self-pay

## 2023-04-21 ENCOUNTER — Other Ambulatory Visit (HOSPITAL_BASED_OUTPATIENT_CLINIC_OR_DEPARTMENT_OTHER): Payer: Self-pay

## 2023-05-07 ENCOUNTER — Other Ambulatory Visit: Payer: Self-pay

## 2023-05-10 ENCOUNTER — Other Ambulatory Visit: Payer: Self-pay

## 2023-05-28 ENCOUNTER — Emergency Department (HOSPITAL_BASED_OUTPATIENT_CLINIC_OR_DEPARTMENT_OTHER)
Admission: EM | Admit: 2023-05-28 | Discharge: 2023-05-28 | Disposition: A | Discharge status: Home / Self Care | Payer: 59 | Attending: Emergency Medicine | Admitting: Emergency Medicine

## 2023-05-28 ENCOUNTER — Encounter (HOSPITAL_BASED_OUTPATIENT_CLINIC_OR_DEPARTMENT_OTHER): Payer: Self-pay

## 2023-05-28 DIAGNOSIS — Z79899 Other long term (current) drug therapy: Secondary | ICD-10-CM | POA: Diagnosis not present

## 2023-05-28 DIAGNOSIS — U071 COVID-19: Secondary | ICD-10-CM

## 2023-05-28 DIAGNOSIS — J029 Acute pharyngitis, unspecified: Secondary | ICD-10-CM | POA: Diagnosis present

## 2023-05-28 LAB — GROUP A STREP BY PCR: Group A Strep by PCR: NOT DETECTED

## 2023-05-28 LAB — RESP PANEL BY RT-PCR (RSV, FLU A&B, COVID)  RVPGX2
Influenza A by PCR: NEGATIVE
Influenza B by PCR: NEGATIVE
Resp Syncytial Virus by PCR: NEGATIVE
SARS Coronavirus 2 by RT PCR: POSITIVE — AB

## 2023-05-28 MED ORDER — ACETAMINOPHEN 500 MG PO TABS
1000.0000 mg | ORAL_TABLET | Freq: Once | ORAL | Status: AC
  Administered 2023-05-28: 1000 mg via ORAL
  Filled 2023-05-28: qty 2

## 2023-05-28 NOTE — ED Triage Notes (Signed)
Pt self ambulated to exam 14 c/o sore throat, SOB, fever chills x 12 hours. Pt VSS NAD on room air. Current temp 100.8 Pt states he took OTC meds prior to arrival.

## 2023-05-28 NOTE — ED Provider Notes (Signed)
[facility] EMERGENCY DEPARTMENT AT [facility]  Provider Note  CSN: [id number] Arrival date & time: 05/28/23 1914  History Chief Complaint  Patient presents with   Sore Throat    [name] [name] is a 37 y.o. 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 He reports about 12 hours of sore throat, headaches, body aches and dry cough. Does not normally have any issues with his breathing although does have some residual pulmonary scar from the radiation/surgery.    Home Medications Prior to Admission medications   Medication Sig Start Date End Date Taking? Authorizing Provider  amLODipine (NORVASC) 2.5 MG tablet Take 1 tablet (2.5 mg total) by mouth daily. 10/19/22 10/19/23  Monica Becton, MD  cholestyramine light (PREVALITE) 4 g packet Take 1 packet (4 g total) by mouth 2 (two) times daily before meals Mix dose in 60-180 mL of water, milk or juice. 12/15/22     Famotidine (PEPCID PO) Take 1 tablet by mouth daily as needed (heartburn).    [provider]  fluticasone (FLONASE) 50 MCG/ACT nasal spray Place 1 spray into both nostrils daily as needed for allergies or rhinitis. 07/06/21   Eustace Moore, MD  ibuprofen (ADVIL) 200 MG tablet Take 400 mg by mouth every 6 (six) hours as needed (pain).    [provider]  melatonin 1 MG TABS tablet Take 8 mg by mouth at bedtime.    [provider]  meloxicam (MOBIC) 15 MG tablet Take 1 tablet (15 mg total) by mouth daily. 06/10/22 07/19/23  Monica Becton, MD  methylphenidate (CONCERTA) 18 MG PO CR tablet Take 1 tablet (18 mg total) by mouth daily. 03/24/23   Monica Becton, MD  niacin 500 MG tablet Take 500 mg by mouth at bedtime.    [provider]  ondansetron (ZOFRAN-ODT) 8 MG disintegrating tablet Take 1 tablet (8 mg total) by mouth every 8 (eight) hours as needed for  nausea. 07/28/21   Monica Becton, MD  oseltamivir (TAMIFLU) 75 MG capsule Take 1 capsule (75 mg total) by mouth every 12 (twelve) hours. 10/04/22   Trevor Iha, FNP  oxyCODONE (OXY IR/ROXICODONE) 5 MG immediate release tablet Take 1 tablet (5 mg total) by mouth every 6 (six) hours as needed for severe pain. Patient not taking: Reported on 10/04/2022 09/12/22   Gaynelle Adu, MD  promethazine-dextromethorphan (PROMETHAZINE-DM) 6.25-15 MG/5ML syrup Take 5 mLs by mouth 2 (two) times daily as needed for cough. 10/04/22   Trevor Iha, FNP  vortioxetine HBr (TRINTELLIX) 10 MG TABS tablet Take 1 tablet (10 mg total) by mouth daily. 01/13/23 01/13/24  Monica Becton, MD  promethazine (PHENERGAN) 25 MG tablet Take 1 tablet (25 mg total) by mouth every 6 (six) hours as needed for nausea. Patient not taking: Reported on 03/15/2021 03/10/21 03/16/21  Monica Becton, MD  sertraline (ZOLOFT) 25 MG tablet Take 1 tablet (25 mg total) by mouth daily. 11/30/20 12/16/20  Monica Becton, MD  topiramate (TOPAMAX) 50 MG tablet TAKE 1 TABLET BY MOUTH TWICE DAILY 11/10/20 11/30/20  Monica Becton, MD     Allergies    Patient has no known allergies.   Review of Systems   Review of Systems Please see HPI for pertinent positives and negatives  Physical Exam BP 137/88 (BP Location: Left Arm)   Pulse (!) 109   Temp (!) 100.8  F (38.2 C) (Oral)   Resp 19   Wt 108.9 kg   SpO2 99%   BMI 35.44 kg/m   Physical Exam Vitals and nursing note reviewed.  Constitutional:      Appearance: Normal appearance.  HENT:     Head: Normocephalic and atraumatic.     Right Ear: Tympanic membrane normal.     Left Ear: Tympanic membrane normal.     Nose: Nose normal.     Mouth/Throat:     Mouth: Mucous membranes are moist.     Pharynx: Posterior oropharyngeal erythema present. No oropharyngeal exudate.     Tonsils: No tonsillar exudate.  Eyes:     Extraocular Movements: Extraocular movements  intact.     Conjunctiva/sclera: Conjunctivae normal.  Cardiovascular:     Rate and Rhythm: Normal rate.  Pulmonary:     Effort: Pulmonary effort is normal.     Breath sounds: Normal breath sounds.  Abdominal:     General: Abdomen is flat.     Palpations: Abdomen is soft.     Tenderness: There is no abdominal tenderness.  Musculoskeletal:        General: No swelling. Normal range of motion.     Cervical back: Neck supple.  Lymphadenopathy:     Cervical: No cervical adenopathy.  Skin:    General: Skin is warm and dry.  Neurological:     General: No focal deficit present.     Mental Status: He is alert.  Psychiatric:        Mood and Affect: Mood normal.     ED Results / Procedures / Treatments   EKG None  Procedures Procedures  Medications Ordered in the ED Medications  acetaminophen (TYLENOL) tablet 1,000 mg (1,000 mg Oral Given 05/28/23 0333)    Initial Impression and Plan  Patient here with likely viral URI, suspect Covid, also has some sore throat. Will check swabs. APAP for fever. Overall he is well appearing, in no distress with normal SpO2  ED Course   Clinical Course as of 05/28/23 0405  Sun May 28, 2023  0404 Covid is positive. Doubt patient would benefit from Paxlovid. Recommend he continue with symptomatic treatment at home. Rest, hydration. PCP follow up, RTED for any other concerns.   [CS]    Clinical Course User Index [CS] Pollyann Savoy, MD     MDM Rules/Calculators/A&P Medical Decision Making Problems Addressed: COVID-19: acute illness or injury  Amount and/or Complexity of Data Reviewed Labs: ordered. Decision-making details documented in ED Course.  Risk OTC drugs.     Final Clinical Impression(s) / ED Diagnoses Final diagnoses:  COVID-19    Rx / DC Orders ED Discharge Orders     None        Pollyann Savoy, MD 05/28/23 [phone]

## 2023-05-30 ENCOUNTER — Encounter: Payer: Self-pay | Admitting: Sports Medicine

## 2023-05-30 ENCOUNTER — Telehealth: Payer: Self-pay | Admitting: General Practice

## 2023-05-30 ENCOUNTER — Other Ambulatory Visit (HOSPITAL_BASED_OUTPATIENT_CLINIC_OR_DEPARTMENT_OTHER): Payer: Self-pay

## 2023-05-30 DIAGNOSIS — U071 COVID-19: Secondary | ICD-10-CM | POA: Insufficient documentation

## 2023-05-30 MED ORDER — NYSTATIN 100000 UNIT/ML MT SUSP
10.0000 mL | Freq: Four times a day (QID) | OROMUCOSAL | 1 refills | Status: AC
Start: 2023-05-30 — End: ?
  Filled 2023-05-30: qty 500, 13d supply, fill #0

## 2023-05-30 NOTE — Transitions of Care (Post Inpatient/ED Visit) (Signed)
05/30/2023  Name: [name] [name] MRN: [id number] DOB: 1986-02-08  Today's TOC FU Call Status: Today's TOC FU Call Status:: Successful TOC FU Call Completed TOC FU Call Complete Date: 05/30/23 Patient's Name and Date of Birth confirmed.  Transition Care Management Follow-up Telephone Call Date of Discharge: 05/28/23 Discharge Facility: [facility] ([facility]) Type of Discharge: Emergency Department Reason for ED Visit: Respiratory Respiratory Diagnosis:  (COVID) How have you been since you were released from the hospital?: Better Any questions or concerns?: No  Items Reviewed: Did you receive and understand the discharge instructions provided?: Yes Medications obtained,verified, and reconciled?: Yes (Medications Reviewed) Any new allergies since your discharge?: No Dietary orders reviewed?: NA Do you have support at home?: Yes  Medications Reviewed Today: Medications Reviewed Today     Reviewed by Modesto Charon, RN (Registered Nurse) on 05/30/23 at 1504  Med List Status: <None>   Medication Order Taking? Sig Documenting Provider Last Dose Status Informant  amLODipine (NORVASC) 2.5 MG tablet [id number]  Take 1 tablet (2.5 mg total) by mouth daily. Monica Becton, MD  Active   cholestyramine light (PREVALITE) 4 g packet [id number]  Take 1 packet (4 g total) by mouth 2 (two) times daily before meals Mix dose in 60-180 mL of water, milk or juice.   Active   Famotidine (PEPCID PO) [id number] No Take 1 tablet by mouth daily as needed (heartburn). [provider] 09/12/2022 0115 Active Self           Med Note Vickey Sages, HEATHER L   Sun Apr 24, 2022  9:05 AM) OTC  fluticasone (FLONASE) 50 MCG/ACT nasal spray [id number] No Place 1 spray into both nostrils daily as needed for allergies or rhinitis. Eustace Moore, MD Unknown Active Self  ibuprofen (ADVIL) 200 MG tablet [id number] No Take 400 mg by mouth every 6 (six) hours as needed (pain). [provider] Unknown  Active Self  magic mouthwash (nystatin, lidocaine, diphenhydrAMINE, alum & mag hydroxide) suspension [id number]  Take 10 mLs by mouth 4 (four) times daily. Monica Becton, MD  Active   melatonin 1 MG TABS tablet [id number] No Take 8 mg by mouth at bedtime. [provider] 09/11/2022 Active Self  meloxicam (MOBIC) 15 MG tablet [id number] No Take 1 tablet (15 mg total) by mouth daily. Monica Becton, MD 09/11/2022 Active Self  methylphenidate (CONCERTA) 18 MG PO CR tablet [id number]  Take 1 tablet (18 mg total) by mouth daily. Monica Becton, MD  Active   niacin 500 MG tablet [id number] No Take 500 mg by mouth at bedtime. [provider] 09/11/2022 Active Self  ondansetron (ZOFRAN-ODT) 8 MG disintegrating tablet [id number] No Take 1 tablet (8 mg total) by mouth every 8 (eight) hours as needed for nausea. Monica Becton, MD Past Month Active Self  oseltamivir (TAMIFLU) 75 MG capsule [id number]  Take 1 capsule (75 mg total) by mouth every 12 (twelve) hours. Trevor Iha, FNP  Active   oxyCODONE (OXY IR/ROXICODONE) 5 MG immediate release tablet [id number] No Take 1 tablet (5 mg total) by mouth every 6 (six) hours as needed for severe pain.  Patient not taking: Reported on 10/04/2022   Gaynelle Adu, MD Not Taking Active Self  Patient not taking:  Discontinued 03/16/21 1201 promethazine-dextromethorphan (PROMETHAZINE-DM) 6.25-15 MG/5ML syrup [id number]  Take 5 mLs by mouth 2 (two) times daily as needed for cough. Trevor Iha, FNP  Active   Discontinued 12/16/20 1225   Discontinued 11/30/20 1551   vortioxetine HBr (TRINTELLIX) 10 MG  TABS tablet [id number]  Take 1 tablet (10 mg total) by mouth daily. Monica Becton, MD  Active             Home Care and Equipment/Supplies: Were Home Health Services Ordered?: NA Any new equipment or medical supplies ordered?: NA  Functional Questionnaire: Do you need assistance with bathing/showering or dressing?:  No Do you need assistance with meal preparation?: No Do you need assistance with eating?: No Do you have difficulty maintaining continence: No Do you need assistance with getting out of bed/getting out of a chair/moving?: No Do you have difficulty managing or taking your medications?: No  Follow up appointments reviewed: PCP Follow-up appointment confirmed?: NA Specialist Hospital Follow-up appointment confirmed?: NA Do you need transportation to your follow-up appointment?: No Do you understand care options if your condition(s) worsen?: Yes-patient verbalized understanding  SDOH Interventions Today    Flowsheet Row Most Recent Value  SDOH Interventions   Transportation Interventions Intervention Not Indicated       SIGNATURE Modesto Charon, RN BSN Nurse Health Advisor

## 2023-06-02 ENCOUNTER — Other Ambulatory Visit (HOSPITAL_COMMUNITY): Payer: Self-pay

## 2023-07-04 ENCOUNTER — Other Ambulatory Visit: Payer: Self-pay

## 2023-07-04 ENCOUNTER — Other Ambulatory Visit (HOSPITAL_COMMUNITY): Payer: Self-pay

## 2023-07-04 ENCOUNTER — Other Ambulatory Visit: Payer: Self-pay | Admitting: Sports Medicine

## 2023-07-04 DIAGNOSIS — F909 Attention-deficit hyperactivity disorder, unspecified type: Secondary | ICD-10-CM

## 2023-07-04 MED ORDER — METHYLPHENIDATE HCL ER (OSM) 18 MG PO TBCR
18.0000 mg | EXTENDED_RELEASE_TABLET | Freq: Every day | ORAL | 0 refills | Status: DC
Start: 2023-07-04 — End: 2023-10-11
  Filled 2023-07-04: qty 90, 90d supply, fill #0

## 2023-07-20 ENCOUNTER — Other Ambulatory Visit (HOSPITAL_COMMUNITY): Payer: Self-pay

## 2023-07-20 ENCOUNTER — Ambulatory Visit: Payer: 59 | Admitting: Sports Medicine

## 2023-07-20 ENCOUNTER — Other Ambulatory Visit: Payer: Self-pay

## 2023-07-20 VITALS — BP 137/89 | HR 96 | Ht 69.0 in | Wt 244.0 lb

## 2023-07-20 DIAGNOSIS — Z0000 Encounter for general adult medical examination without abnormal findings: Secondary | ICD-10-CM | POA: Diagnosis not present

## 2023-07-20 DIAGNOSIS — Z23 Encounter for immunization: Secondary | ICD-10-CM

## 2023-07-20 DIAGNOSIS — E663 Overweight: Secondary | ICD-10-CM | POA: Diagnosis not present

## 2023-07-20 MED ORDER — ZEPBOUND 2.5 MG/0.5ML ~~LOC~~ SOAJ: 2.5000 mg | SUBCUTANEOUS | 11 refills | Status: DC

## 2023-07-20 MED ORDER — ONDANSETRON 8 MG PO TBDP
8.0000 mg | ORAL_TABLET | Freq: Three times a day (TID) | ORAL | 3 refills | Status: AC | PRN
  Filled 2023-07-20: qty 20, 7d supply, fill #0

## 2023-07-20 NOTE — Assessment & Plan Note (Signed)
We will go and start direct to [name] [name] vials. I did give [name] [name] some syringes as well as 18-gauge draw and 30-gauge injection needles. Zofran for nausea. I would like to see him back after about a month on the [name].

## 2023-07-20 NOTE — Assessment & Plan Note (Signed)
Annual physical as above, flu shot today.

## 2023-07-20 NOTE — Progress Notes (Signed)
Subjective:    CC: Annual Physical Exam  HPI:  This patient is here for their annual physical  I reviewed the past medical history, family history, social history, surgical history, and allergies today and no changes were needed.  Please see the problem list section below in epic for further details.  Past Medical History: Past Medical History:  Diagnosis Date   ADD (attention deficit disorder)    Anxiety    Cancer (HCC) 2022   chondrosarcoma   Dysplastic nevus 01/05/2023   Left mid back - Moderate   Family history of breast cancer    Family history of pancreatic cancer    Family history of prostate cancer    GERD (gastroesophageal reflux disease)    History of chondrosarcoma    History of kidney stones    Hyperlipidemia    Mitral valve prolapse    PONV (postoperative nausea and vomiting)    Raynaud disease    Seasonal allergies    Past Surgical History: Past Surgical History:  Procedure Laterality Date   CHEST WALL RECONSTRUCTION N/A 04/06/2021   Procedure: CHEST WALL RESECTION WITH MESH RECONSTRUCTION;  Surgeon: Corliss Skains, MD;  Location: [facility] OR;  Service: Thoracic;  Laterality: N/A;   CHOLECYSTECTOMY N/A 09/12/2022   Procedure: LAPAROSCOPIC CHOLECYSTECTOMY WITH ICG DYE;  Surgeon: Gaynelle Adu, MD;  Location: [facility] ORS;  Service: General;  Laterality: N/A;   INNER EAR SURGERY     Social History: Social History   Socioeconomic History   Marital status: Single    Spouse name: Not on file   Number of children: Not on file   Years of education: Not on file   Highest education level: Doctorate  Occupational History   Not on file  Tobacco Use   Smoking status: Never   Smokeless tobacco: Never  Vaping Use   Vaping status: Never Used  Substance and Sexual Activity   Alcohol use: No   Drug use: No   Sexual activity: Yes  Other Topics Concern   Not on file  Social History Narrative   Not on file   Social Determinants of Health   Financial Resource  Strain: Low Risk  (07/19/2023)   Overall Financial Resource Strain (CARDIA)    Difficulty of Paying Living Expenses: Not hard at all  Food Insecurity: No Food Insecurity (07/19/2023)   Hunger Vital Sign    Worried About Running Out of Food in the Last Year: Never true    Ran Out of Food in the Last Year: Never true  Transportation Needs: No Transportation Needs (07/19/2023)   PRAPARE - Administrator, Civil Service (Medical): No    Lack of Transportation (Non-Medical): No  Physical Activity: Unknown (07/19/2023)   Exercise Vital Sign    Days of Exercise per Week: 0 days    Minutes of Exercise per Session: Not on file  Stress: Stress Concern Present (07/19/2023)   [employer] of Occupational Health - Occupational Stress Questionnaire    Feeling of Stress : To some extent  Social Connections: Socially Isolated (07/19/2023)   Social Connection and Isolation Panel [NHANES]    Frequency of Communication with Friends and Family: Twice a week    Frequency of Social Gatherings with Friends and Family: Once a week    Attends Religious Services: Never    Database administrator or Organizations: No    Attends Engineer, structural: Not on file    Marital Status: Never married   Family History: Family  History  Problem Relation Age of Onset   Hypertension Father    Prostate cancer Father        dx late 23s   Melanoma Father        dx 31s, on wrist   Prostate cancer Paternal Uncle        dx 36s   Melanoma Paternal Uncle    Breast cancer Maternal Grandmother    Pancreatic cancer Maternal Grandmother    Stroke Maternal Grandfather    Hypertension Paternal Grandmother    Other Paternal Grandmother        benign brain tumor   Alzheimer's disease Paternal Grandfather    Prostate cancer Paternal Grandfather    Allergies: No Known Allergies Medications: See med rec.  Review of Systems: No headache, visual changes, nausea, vomiting, diarrhea, constipation,  dizziness, abdominal pain, skin rash, fevers, chills, night sweats, swollen lymph nodes, weight loss, chest pain, body aches, joint swelling, muscle aches, shortness of breath, mood changes, visual or auditory hallucinations.  Objective:    General: Well Developed, well nourished, and in no acute distress.  Neuro: Alert and oriented x3, extra-ocular muscles intact, sensation grossly intact. Cranial nerves II through XII are intact, motor, sensory, and coordinative functions are all intact. HEENT: Normocephalic, atraumatic, pupils equal round reactive to light, neck supple, no masses, no lymphadenopathy, thyroid nonpalpable. Oropharynx, nasopharynx, external ear canals are unremarkable. Skin: Warm and dry, no rashes noted.  Cardiac: Regular rate and rhythm, no murmurs rubs or gallops.  Respiratory: Clear to auscultation bilaterally. Not using accessory muscles, speaking in full sentences.  Abdominal: Soft, nontender, nondistended, positive bowel sounds, no masses, no organomegaly.  Musculoskeletal: Shoulder, elbow, wrist, hip, knee, ankle stable, and with full range of motion.  Impression and Recommendations:    The patient was counselled, risk factors were discussed, anticipatory guidance given.  Annual physical exam Annual physical as above, flu shot today.   Overweight We will go and start direct to Lilly Zepbound vials. I did give Mylz some syringes as well as 18-gauge draw and 30-gauge injection needles. Zofran for nausea. I would like to see him back after about a month on the Zepbound.   ____________________________________________ Ihor Austin. Benjamin Stain, M.D., ABFM., CAQSM., AME. Primary Care and Sports Medicine [facility]  Adjunct Professor of Family Medicine  [place] [employer] [facility] [employer]  Restaurant manager, fast food

## 2023-07-26 ENCOUNTER — Other Ambulatory Visit: Payer: Self-pay | Admitting: Sports Medicine

## 2023-07-26 DIAGNOSIS — G8929 Other chronic pain: Secondary | ICD-10-CM

## 2023-07-26 MED ORDER — MELOXICAM 15 MG PO TABS
15.0000 mg | ORAL_TABLET | Freq: Every day | ORAL | 3 refills | Status: AC
  Filled 2023-07-26: qty 90, 90d supply, fill #0
  Filled 2023-10-20: qty 90, 90d supply, fill #1
  Filled 2024-01-30: qty 90, 90d supply, fill #2
  Filled 2024-04-29: qty 90, 90d supply, fill #3

## 2023-07-27 ENCOUNTER — Other Ambulatory Visit: Payer: Self-pay

## 2023-07-27 DIAGNOSIS — E663 Overweight: Secondary | ICD-10-CM | POA: Diagnosis not present

## 2023-07-28 LAB — COMPREHENSIVE METABOLIC PANEL
ALT: 87 [IU]/L — ABNORMAL HIGH (ref 0–44)
AST: 49 [IU]/L — ABNORMAL HIGH (ref 0–40)
Albumin: 4.5 g/dL (ref 4.1–5.1)
Alkaline Phosphatase: 137 [IU]/L — ABNORMAL HIGH (ref 44–121)
BUN/Creatinine Ratio: 9 (ref 9–20)
BUN: 8 mg/dL (ref 6–20)
Bilirubin Total: 0.7 mg/dL (ref 0.0–1.2)
CO2: 22 mmol/L (ref 20–29)
Calcium: 9.3 mg/dL (ref 8.7–10.2)
Chloride: 105 mmol/L (ref 96–106)
Creatinine, Ser: 0.93 mg/dL (ref 0.76–1.27)
Globulin, Total: 2.3 g/dL (ref 1.5–4.5)
Glucose: 83 mg/dL (ref 70–99)
Potassium: 4 mmol/L (ref 3.5–5.2)
Sodium: 142 mmol/L (ref 134–144)
Total Protein: 6.8 g/dL (ref 6.0–8.5)
eGFR: 109 mL/min/{1.73_m2} (ref >59)

## 2023-07-28 LAB — LIPID PANEL
Chol/HDL Ratio: 3.6 ratio (ref 0.0–5.0)
Cholesterol, Total: 145 mg/dL (ref 100–199)
HDL: 40 mg/dL (ref >39)
LDL Chol Calc (NIH): 79 mg/dL (ref 0–99)
Triglycerides: 146 mg/dL (ref 0–149)
VLDL Cholesterol Cal: 26 mg/dL (ref 5–40)

## 2023-07-28 LAB — CBC
Hematocrit: 48.8 % (ref 37.5–51.0)
Hemoglobin: 16.3 g/dL (ref 13.0–17.7)
MCH: 30.5 pg (ref 26.6–33.0)
MCHC: 33.4 g/dL (ref 31.5–35.7)
MCV: 91 fL (ref 79–97)
Platelets: 285 10*3/uL (ref 150–450)
RBC: 5.34 x10E6/uL (ref 4.14–5.80)
RDW: 12 % (ref 11.6–15.4)
WBC: 6.6 10*3/uL (ref 3.4–10.8)

## 2023-07-28 LAB — HEMOGLOBIN A1C
Est. average glucose Bld gHb Est-mCnc: 105 mg/dL
Hgb A1c MFr Bld: 5.3 % (ref 4.8–5.6)

## 2023-07-28 LAB — TSH: TSH: 0.765 u[IU]/mL (ref 0.450–4.500)

## 2023-08-16 ENCOUNTER — Other Ambulatory Visit: Payer: Self-pay | Admitting: Sports Medicine

## 2023-08-16 ENCOUNTER — Encounter: Payer: Self-pay | Admitting: Sports Medicine

## 2023-08-16 ENCOUNTER — Ambulatory Visit: Payer: 59 | Admitting: Sports Medicine

## 2023-08-16 DIAGNOSIS — E663 Overweight: Secondary | ICD-10-CM

## 2023-08-16 DIAGNOSIS — I7300 Raynaud's syndrome without gangrene: Secondary | ICD-10-CM

## 2023-08-16 MED ORDER — ZEPBOUND 5 MG/0.5ML ~~LOC~~ SOAJ: 5.0000 mg | SUBCUTANEOUS | 11 refills | Status: AC

## 2023-08-16 NOTE — Progress Notes (Signed)
    Procedures performed today:    None.  Independent interpretation of notes and tests performed by another provider:   None.  Brief History, Exam, Impression, and Recommendations:    Overweight [name] [name] did extremely well with the Lilly single dose Zepbound vials. He has lost 6 pounds on 2.5 mg. He will continue this for another month and then switch to the 5 mg vials. He already has Zofran for nausea, return to see me 3 to 6 months as needed.    ____________________________________________ Ihor Austin. Benjamin Stain, M.D., ABFM., CAQSM., AME. Primary Care and Sports Medicine [facility]  Adjunct Professor of Family Medicine  [place] [employer] [facility] [employer]  Restaurant manager, fast food

## 2023-08-16 NOTE — Assessment & Plan Note (Signed)
Donzel did extremely well with the Lilly single dose Zepbound vials. He has lost 6 pounds on 2.5 mg. He will continue this for another month and then switch to the 5 mg vials. He already has Zofran for nausea, return to see me 3 to 6 months as needed.

## 2023-08-17 ENCOUNTER — Other Ambulatory Visit (HOSPITAL_COMMUNITY): Payer: Self-pay

## 2023-08-17 MED ORDER — AMLODIPINE BESYLATE 2.5 MG PO TABS
2.5000 mg | ORAL_TABLET | Freq: Every day | ORAL | 2 refills | Status: DC
  Filled 2023-08-17: qty 90, 90d supply, fill #0
  Filled 2023-11-26: qty 90, 90d supply, fill #1
  Filled 2024-02-21: qty 90, 90d supply, fill #2

## 2023-09-11 ENCOUNTER — Other Ambulatory Visit: Payer: Self-pay

## 2023-10-11 ENCOUNTER — Other Ambulatory Visit: Payer: Self-pay

## 2023-10-11 ENCOUNTER — Other Ambulatory Visit: Payer: Self-pay | Admitting: Sports Medicine

## 2023-10-11 ENCOUNTER — Other Ambulatory Visit (HOSPITAL_COMMUNITY): Payer: Self-pay

## 2023-10-11 DIAGNOSIS — F909 Attention-deficit hyperactivity disorder, unspecified type: Secondary | ICD-10-CM

## 2023-10-11 MED ORDER — METHYLPHENIDATE HCL ER (OSM) 18 MG PO TBCR
18.0000 mg | EXTENDED_RELEASE_TABLET | Freq: Every day | ORAL | 0 refills | Status: DC
  Filled 2023-10-11: qty 19, 19d supply, fill #0
  Filled 2023-10-11: qty 71, 71d supply, fill #0

## 2023-10-13 ENCOUNTER — Other Ambulatory Visit (HOSPITAL_BASED_OUTPATIENT_CLINIC_OR_DEPARTMENT_OTHER): Payer: Self-pay

## 2023-10-13 MED ORDER — COMIRNATY 30 MCG/0.3ML IM SUSY
PREFILLED_SYRINGE | INTRAMUSCULAR | 0 refills | Status: AC
  Filled 2023-10-13: qty 0.3, 1d supply, fill #0

## 2023-10-20 ENCOUNTER — Other Ambulatory Visit (HOSPITAL_COMMUNITY): Payer: Self-pay

## 2023-10-20 ENCOUNTER — Other Ambulatory Visit: Payer: Self-pay

## 2023-11-27 ENCOUNTER — Other Ambulatory Visit: Payer: Self-pay

## 2023-11-30 ENCOUNTER — Other Ambulatory Visit: Payer: Self-pay | Admitting: Thoracic Surgery (Cardiothoracic Vascular Surgery)

## 2023-11-30 DIAGNOSIS — R222 Localized swelling, mass and lump, trunk: Secondary | ICD-10-CM

## 2023-12-19 ENCOUNTER — Other Ambulatory Visit: Payer: Self-pay | Admitting: Thoracic Surgery (Cardiothoracic Vascular Surgery)

## 2023-12-27 ENCOUNTER — Ambulatory Visit

## 2023-12-27 DIAGNOSIS — R222 Localized swelling, mass and lump, trunk: Secondary | ICD-10-CM

## 2023-12-27 DIAGNOSIS — C413 Malignant neoplasm of ribs, sternum and clavicle: Secondary | ICD-10-CM | POA: Diagnosis not present

## 2023-12-29 ENCOUNTER — Other Ambulatory Visit

## 2024-01-05 ENCOUNTER — Telehealth: Admitting: Thoracic Surgery (Cardiothoracic Vascular Surgery)

## 2024-01-08 ENCOUNTER — Other Ambulatory Visit (HOSPITAL_COMMUNITY): Payer: Self-pay

## 2024-01-12 ENCOUNTER — Other Ambulatory Visit (HOSPITAL_COMMUNITY): Payer: Self-pay

## 2024-01-12 MED ORDER — CHOLESTYRAMINE LIGHT 4 G PO PACK
4.0000 g | PACK | Freq: Two times a day (BID) | ORAL | 3 refills | Status: DC
  Filled 2024-01-12: qty 60, 30d supply, fill #0
  Filled 2024-02-19: qty 60, 30d supply, fill #1
  Filled 2024-03-17: qty 60, 30d supply, fill #2
  Filled 2024-04-29: qty 60, 30d supply, fill #3

## 2024-01-13 ENCOUNTER — Other Ambulatory Visit (HOSPITAL_COMMUNITY): Payer: Self-pay

## 2024-01-30 ENCOUNTER — Other Ambulatory Visit: Payer: Self-pay | Admitting: Sports Medicine

## 2024-01-30 ENCOUNTER — Other Ambulatory Visit (HOSPITAL_COMMUNITY): Payer: Self-pay

## 2024-01-30 ENCOUNTER — Other Ambulatory Visit: Payer: Self-pay

## 2024-01-30 DIAGNOSIS — F909 Attention-deficit hyperactivity disorder, unspecified type: Secondary | ICD-10-CM

## 2024-01-30 DIAGNOSIS — F4323 Adjustment disorder with mixed anxiety and depressed mood: Secondary | ICD-10-CM

## 2024-01-30 MED ORDER — VORTIOXETINE HBR 10 MG PO TABS
10.0000 mg | ORAL_TABLET | Freq: Every day | ORAL | 3 refills | Status: AC
  Filled 2024-01-30: qty 90, 90d supply, fill #0
  Filled 2024-04-29: qty 90, 90d supply, fill #1

## 2024-01-30 MED ORDER — METHYLPHENIDATE HCL ER (OSM) 18 MG PO TBCR
18.0000 mg | EXTENDED_RELEASE_TABLET | Freq: Every day | ORAL | 0 refills | Status: DC
  Filled 2024-01-30: qty 90, 90d supply, fill #0

## 2024-02-02 ENCOUNTER — Ambulatory Visit: Admitting: Thoracic Surgery (Cardiothoracic Vascular Surgery)

## 2024-02-05 ENCOUNTER — Other Ambulatory Visit (HOSPITAL_COMMUNITY): Payer: Self-pay

## 2024-02-19 ENCOUNTER — Other Ambulatory Visit (HOSPITAL_COMMUNITY): Payer: Self-pay

## 2024-02-21 ENCOUNTER — Other Ambulatory Visit (HOSPITAL_COMMUNITY): Payer: Self-pay

## 2024-02-23 ENCOUNTER — Other Ambulatory Visit: Payer: Self-pay

## 2024-02-23 ENCOUNTER — Encounter: Payer: Self-pay | Admitting: Sports Medicine

## 2024-02-23 ENCOUNTER — Ambulatory Visit

## 2024-02-23 ENCOUNTER — Ambulatory Visit: Admitting: Sports Medicine

## 2024-02-23 DIAGNOSIS — M542 Cervicalgia: Secondary | ICD-10-CM | POA: Diagnosis not present

## 2024-02-23 DIAGNOSIS — M5030 Other cervical disc degeneration, unspecified cervical region: Secondary | ICD-10-CM | POA: Diagnosis not present

## 2024-02-23 DIAGNOSIS — M47812 Spondylosis without myelopathy or radiculopathy, cervical region: Secondary | ICD-10-CM | POA: Diagnosis not present

## 2024-02-23 MED ORDER — PREDNISONE 50 MG PO TABS
50.0000 mg | ORAL_TABLET | Freq: Every day | ORAL | 0 refills | Status: AC
  Filled 2024-02-23: qty 5, 5d supply, fill #0

## 2024-02-23 NOTE — Progress Notes (Signed)
    Procedures performed today:    None.  Independent interpretation of notes and tests performed by another provider:   None.  Brief History, Exam, Impression, and Recommendations:    DDD (degenerative disc disease), cervical This very pleasant 37 year old male returns, he is having increasing discomfort right sided periscapular. Nothing overtly radicular down the hands or fingertips. He does have a history of 5th and 6th rib resection for a mass. 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 He does not have any discomfort at this location. This problem is likely referred from the cervical spine, we will add 5 days of prednisone , updated x-rays, considering his history of bone masses we will get an MRI, and home physical therapy, return to see me in 6 weeks.    ____________________________________________ Joselyn Nicely. Sandy Crumb, M.D., ABFM., CAQSM., AME. Primary Care and Sports Medicine [facility]  Adjunct Professor of [facility] Medicine  [employer]  Restaurant manager, fast food

## 2024-02-23 NOTE — Assessment & Plan Note (Signed)
 This very pleasant 38 year old male returns, he is having increasing discomfort right sided periscapular. Nothing overtly radicular down the hands or fingertips. He does have a history of 5th and 6th rib resection for a mass. 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 He does not have any discomfort at this location. This problem is likely referred from the cervical spine, we will add 5 days of prednisone , updated x-rays, considering his history of bone masses we will get an MRI, and home physical therapy, return to see me in 6 weeks.

## 2024-02-24 ENCOUNTER — Ambulatory Visit

## 2024-02-24 DIAGNOSIS — M47812 Spondylosis without myelopathy or radiculopathy, cervical region: Secondary | ICD-10-CM | POA: Diagnosis not present

## 2024-02-24 DIAGNOSIS — M4802 Spinal stenosis, cervical region: Secondary | ICD-10-CM | POA: Diagnosis not present

## 2024-02-24 DIAGNOSIS — M50221 Other cervical disc displacement at C4-C5 level: Secondary | ICD-10-CM | POA: Diagnosis not present

## 2024-02-24 DIAGNOSIS — M5030 Other cervical disc degeneration, unspecified cervical region: Secondary | ICD-10-CM

## 2024-02-24 DIAGNOSIS — M50223 Other cervical disc displacement at C6-C7 level: Secondary | ICD-10-CM | POA: Diagnosis not present

## 2024-02-26 ENCOUNTER — Ambulatory Visit: Payer: Self-pay | Admitting: Sports Medicine

## 2024-03-18 ENCOUNTER — Other Ambulatory Visit: Payer: Self-pay

## 2024-04-02 ENCOUNTER — Ambulatory Visit: Admitting: Sports Medicine

## 2024-04-08 ENCOUNTER — Ambulatory Visit: Admitting: Sports Medicine

## 2024-04-08 ENCOUNTER — Encounter: Payer: Self-pay | Admitting: Sports Medicine

## 2024-04-08 ENCOUNTER — Ambulatory Visit

## 2024-04-08 ENCOUNTER — Other Ambulatory Visit (HOSPITAL_BASED_OUTPATIENT_CLINIC_OR_DEPARTMENT_OTHER): Payer: Self-pay

## 2024-04-08 DIAGNOSIS — M5030 Other cervical disc degeneration, unspecified cervical region: Secondary | ICD-10-CM

## 2024-04-08 DIAGNOSIS — M25572 Pain in left ankle and joints of left foot: Secondary | ICD-10-CM | POA: Diagnosis not present

## 2024-04-08 DIAGNOSIS — M7672 Peroneal tendinitis, left leg: Secondary | ICD-10-CM

## 2024-04-08 MED ORDER — GABAPENTIN 300 MG PO CAPS
ORAL_CAPSULE | ORAL | 3 refills | Status: AC
  Filled 2024-04-08: qty 90, 30d supply, fill #0
  Filled 2024-05-20: qty 90, 8d supply, fill #1
  Filled 2024-06-25: qty 90, 30d supply, fill #2

## 2024-04-08 NOTE — Assessment & Plan Note (Signed)
 Pleasant 38 year old male, discomfort right periscapular, nothing overtly radicular, history of 5th and 6th rib resection for a mass, chronic nonunion right seventh rib lateral aspect but no discomfort at this location, unfortunately he did not respond well to 5 days of prednisone , and MRI did show multilevel cervical DDD worse C5-C6. Due to persistence of discomfort and spite of medications and home physical therapy we will add gabapentin  and up titration. Return to see me 6 weeks, or if insufficient efficacy at max tolerated dose and we will consider a cervical epidural.

## 2024-04-08 NOTE — Assessment & Plan Note (Signed)
 For the past couple weeks has had increasing discomfort behind the lateral malleolus on the left side, does have tenderness over the peroneals, reproduction of pain with resisted eversion, adding a lateral posting wedge, home PT, x-rays, return to see me 6 weeks, injection +/- MRI if not better.

## 2024-04-08 NOTE — Progress Notes (Signed)
    Procedures performed today:    None.  Independent interpretation of notes and tests performed by another provider:   We did go over the imaging together today.  Multilevel cervical DDD with mild central canal stenosis and indentation of the thecal sac worse C5-C6.  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 Due to persistence of discomfort and spite of medications and home physical therapy we will add gabapentin  and up titration. Return to see me 6 weeks, or if insufficient efficacy at max tolerated dose and we will consider a cervical epidural.  Peroneal tendinitis, left For the past couple weeks has had increasing discomfort behind the lateral malleolus on the left side, does have tenderness over the peroneals, reproduction of pain with resisted eversion, adding a lateral posting wedge, home PT, x-rays, return to see me 6 weeks, injection +/- MRI if not better.    ____________________________________________ Debby PARAS. Curtis, M.D., ABFM., CAQSM., AME. Primary Care and Sports Medicine [place] [facility]  Adjunct Professor of [facility] Medicine  [employer]  Restaurant manager, fast food

## 2024-04-09 ENCOUNTER — Ambulatory Visit: Payer: Self-pay | Admitting: Sports Medicine

## 2024-04-29 ENCOUNTER — Other Ambulatory Visit (HOSPITAL_COMMUNITY): Payer: Self-pay

## 2024-05-20 ENCOUNTER — Other Ambulatory Visit (HOSPITAL_COMMUNITY): Payer: Self-pay

## 2024-05-20 ENCOUNTER — Other Ambulatory Visit: Payer: Self-pay | Admitting: Sports Medicine

## 2024-05-20 ENCOUNTER — Ambulatory Visit: Admitting: Sports Medicine

## 2024-05-20 DIAGNOSIS — F909 Attention-deficit hyperactivity disorder, unspecified type: Secondary | ICD-10-CM

## 2024-05-21 ENCOUNTER — Other Ambulatory Visit (HOSPITAL_COMMUNITY): Payer: Self-pay

## 2024-05-21 ENCOUNTER — Other Ambulatory Visit: Payer: Self-pay

## 2024-05-21 MED ORDER — METHYLPHENIDATE HCL ER (OSM) 18 MG PO TBCR
18.0000 mg | EXTENDED_RELEASE_TABLET | Freq: Every day | ORAL | 0 refills | Status: DC
  Filled 2024-05-21: qty 90, 90d supply, fill #0

## 2024-05-22 ENCOUNTER — Other Ambulatory Visit (HOSPITAL_COMMUNITY): Payer: Self-pay

## 2024-05-22 ENCOUNTER — Other Ambulatory Visit: Payer: Self-pay

## 2024-05-23 ENCOUNTER — Other Ambulatory Visit (HOSPITAL_COMMUNITY): Payer: Self-pay

## 2024-05-24 ENCOUNTER — Other Ambulatory Visit (HOSPITAL_COMMUNITY): Payer: Self-pay

## 2024-05-28 ENCOUNTER — Encounter: Payer: Self-pay | Admitting: Sports Medicine

## 2024-05-31 ENCOUNTER — Other Ambulatory Visit (HOSPITAL_COMMUNITY): Payer: Self-pay

## 2024-05-31 ENCOUNTER — Other Ambulatory Visit: Payer: Self-pay

## 2024-05-31 MED ORDER — CHOLESTYRAMINE LIGHT 4 G PO PACK
4.0000 g | PACK | Freq: Two times a day (BID) | ORAL | 3 refills | Status: AC
  Filled 2024-05-31: qty 60, 30d supply, fill #0
  Filled 2024-09-04: qty 60, 30d supply, fill #1
  Filled 2024-10-01: qty 60, 30d supply, fill #2

## 2024-06-04 ENCOUNTER — Other Ambulatory Visit (HOSPITAL_COMMUNITY): Payer: Self-pay

## 2024-06-06 ENCOUNTER — Other Ambulatory Visit (HOSPITAL_COMMUNITY): Payer: Self-pay

## 2024-06-10 ENCOUNTER — Other Ambulatory Visit: Payer: Self-pay

## 2024-06-10 ENCOUNTER — Other Ambulatory Visit (HOSPITAL_COMMUNITY): Payer: Self-pay

## 2024-06-10 ENCOUNTER — Other Ambulatory Visit: Payer: Self-pay | Admitting: Family Medicine

## 2024-06-10 DIAGNOSIS — I7300 Raynaud's syndrome without gangrene: Secondary | ICD-10-CM

## 2024-06-10 MED ORDER — AMLODIPINE BESYLATE 2.5 MG PO TABS
2.5000 mg | ORAL_TABLET | Freq: Every day | ORAL | 2 refills | Status: AC
  Filled 2024-06-10: qty 90, 90d supply, fill #0

## 2024-06-11 ENCOUNTER — Other Ambulatory Visit: Payer: Self-pay

## 2024-06-25 ENCOUNTER — Other Ambulatory Visit (HOSPITAL_COMMUNITY): Payer: Self-pay

## 2024-06-25 ENCOUNTER — Other Ambulatory Visit: Payer: Self-pay

## 2024-07-03 ENCOUNTER — Other Ambulatory Visit (HOSPITAL_BASED_OUTPATIENT_CLINIC_OR_DEPARTMENT_OTHER): Payer: Self-pay

## 2024-07-03 MED ORDER — FLUZONE 0.5 ML IM SUSY
0.5000 mL | PREFILLED_SYRINGE | Freq: Once | INTRAMUSCULAR | 0 refills | Status: AC
  Filled 2024-07-03: qty 0.5, 1d supply, fill #0

## 2024-08-02 ENCOUNTER — Other Ambulatory Visit (HOSPITAL_COMMUNITY): Payer: Self-pay

## 2024-08-02 DIAGNOSIS — Z0000 Encounter for general adult medical examination without abnormal findings: Secondary | ICD-10-CM | POA: Diagnosis not present

## 2024-08-02 DIAGNOSIS — M5030 Other cervical disc degeneration, unspecified cervical region: Secondary | ICD-10-CM | POA: Diagnosis not present

## 2024-08-02 DIAGNOSIS — F909 Attention-deficit hyperactivity disorder, unspecified type: Secondary | ICD-10-CM | POA: Diagnosis not present

## 2024-08-02 DIAGNOSIS — I7300 Raynaud's syndrome without gangrene: Secondary | ICD-10-CM | POA: Diagnosis not present

## 2024-08-02 DIAGNOSIS — F39 Unspecified mood [affective] disorder: Secondary | ICD-10-CM | POA: Diagnosis not present

## 2024-08-02 MED ORDER — MELOXICAM 15 MG PO TABS
15.0000 mg | ORAL_TABLET | Freq: Every day | ORAL | 3 refills | Status: AC
  Filled 2024-08-02: qty 90, 90d supply, fill #0

## 2024-08-02 MED ORDER — METHYLPHENIDATE HCL ER (OSM) 18 MG PO TBCR
18.0000 mg | EXTENDED_RELEASE_TABLET | Freq: Every day | ORAL | 0 refills | Status: AC
  Filled 2024-08-02 – 2024-08-26 (×2): qty 90, 90d supply, fill #0

## 2024-08-02 MED ORDER — AMLODIPINE BESYLATE 2.5 MG PO TABS
2.5000 mg | ORAL_TABLET | Freq: Every day | ORAL | 3 refills | Status: AC
  Filled 2024-08-02 – 2024-10-22 (×2): qty 90, 90d supply, fill #0

## 2024-08-02 MED ORDER — GABAPENTIN 300 MG PO CAPS
300.0000 mg | ORAL_CAPSULE | Freq: Two times a day (BID) | ORAL | 3 refills | Status: AC
  Filled 2024-08-02: qty 180, 90d supply, fill #0

## 2024-08-02 MED ORDER — TRINTELLIX 10 MG PO TABS
10.0000 mg | ORAL_TABLET | Freq: Every day | ORAL | 3 refills | Status: AC
  Filled 2024-08-02: qty 90, 90d supply, fill #0

## 2024-08-05 ENCOUNTER — Other Ambulatory Visit: Payer: Self-pay

## 2024-08-05 ENCOUNTER — Other Ambulatory Visit (HOSPITAL_COMMUNITY): Payer: Self-pay

## 2024-08-06 ENCOUNTER — Other Ambulatory Visit: Payer: Self-pay

## 2024-08-26 ENCOUNTER — Other Ambulatory Visit (HOSPITAL_COMMUNITY): Payer: Self-pay

## 2024-08-26 ENCOUNTER — Other Ambulatory Visit: Payer: Self-pay

## 2024-09-04 ENCOUNTER — Other Ambulatory Visit (HOSPITAL_COMMUNITY): Payer: Self-pay

## 2024-10-02 ENCOUNTER — Other Ambulatory Visit: Payer: Self-pay

## 2024-10-22 ENCOUNTER — Other Ambulatory Visit (HOSPITAL_COMMUNITY): Payer: Self-pay
